# Patient Record
Sex: Female | Born: 1945 | Race: White | Hispanic: No | Marital: Married | Smoking: Former smoker
Health system: Southern US, Community
[De-identification: ages and names within clinical notes are randomized; demographics above are authoritative.]

## PROBLEM LIST (undated history)

## (undated) DIAGNOSIS — Z01818 Encounter for other preprocedural examination: Secondary | ICD-10-CM

## (undated) DIAGNOSIS — I10 Essential (primary) hypertension: Secondary | ICD-10-CM

## (undated) DIAGNOSIS — E7800 Pure hypercholesterolemia, unspecified: Secondary | ICD-10-CM

## (undated) DIAGNOSIS — M1990 Unspecified osteoarthritis, unspecified site: Secondary | ICD-10-CM

## (undated) HISTORY — PX: ABDOMINAL HYSTERECTOMY: SHX81

## (undated) HISTORY — PX: COSMETIC SURGERY: SHX468

## (undated) HISTORY — DX: Unspecified osteoarthritis, unspecified site: M19.90

## (undated) HISTORY — PX: FRACTURE SURGERY: SHX138

## (undated) HISTORY — DX: Encounter for other preprocedural examination: Z01.818

## (undated) HISTORY — PX: SMALL INTESTINE SURGERY: SHX150

## (undated) HISTORY — PX: TONSILLECTOMY: SUR1361

---

## 1978-08-13 HISTORY — PX: TUBAL LIGATION: SHX77

## 2015-01-16 ENCOUNTER — Emergency Department (HOSPITAL_BASED_OUTPATIENT_CLINIC_OR_DEPARTMENT_OTHER)
Admission: EM | Admit: 2015-01-16 | Discharge: 2015-01-16 | Disposition: A | Discharge status: Home / Self Care | Payer: Medicare Other | Attending: Emergency Medicine | Admitting: Emergency Medicine

## 2015-01-16 ENCOUNTER — Emergency Department (HOSPITAL_BASED_OUTPATIENT_CLINIC_OR_DEPARTMENT_OTHER): Payer: Medicare Other

## 2015-01-16 ENCOUNTER — Encounter (HOSPITAL_BASED_OUTPATIENT_CLINIC_OR_DEPARTMENT_OTHER): Payer: Self-pay | Admitting: Emergency Medicine

## 2015-01-16 DIAGNOSIS — Z7982 Long term (current) use of aspirin: Secondary | ICD-10-CM | POA: Diagnosis not present

## 2015-01-16 DIAGNOSIS — I10 Essential (primary) hypertension: Secondary | ICD-10-CM | POA: Insufficient documentation

## 2015-01-16 DIAGNOSIS — K8020 Calculus of gallbladder without cholecystitis without obstruction: Secondary | ICD-10-CM

## 2015-01-16 DIAGNOSIS — E780 Pure hypercholesterolemia: Secondary | ICD-10-CM | POA: Insufficient documentation

## 2015-01-16 DIAGNOSIS — R1011 Right upper quadrant pain: Secondary | ICD-10-CM | POA: Diagnosis present

## 2015-01-16 DIAGNOSIS — Z79899 Other long term (current) drug therapy: Secondary | ICD-10-CM | POA: Insufficient documentation

## 2015-01-16 HISTORY — DX: Pure hypercholesterolemia, unspecified: E78.00

## 2015-01-16 HISTORY — DX: Essential (primary) hypertension: I10

## 2015-01-16 LAB — CBC WITH DIFFERENTIAL/PLATELET
Basophils Absolute: 0 10*3/uL (ref 0.0–0.1)
Basophils Relative: 0 % (ref 0–1)
EOS PCT: 2 % (ref 0–5)
Eosinophils Absolute: 0.1 10*3/uL (ref 0.0–0.7)
HCT: 38.8 % (ref 36.0–46.0)
Hemoglobin: 13.2 g/dL (ref 12.0–15.0)
Lymphocytes Relative: 24 % (ref 12–46)
Lymphs Abs: 1.6 10*3/uL (ref 0.7–4.0)
MCH: 30.8 pg (ref 26.0–34.0)
MCHC: 34 g/dL (ref 30.0–36.0)
MCV: 90.4 fL (ref 78.0–100.0)
MONOS PCT: 9 % (ref 3–12)
Monocytes Absolute: 0.6 10*3/uL (ref 0.1–1.0)
Neutro Abs: 4.4 10*3/uL (ref 1.7–7.7)
Neutrophils Relative %: 65 % (ref 43–77)
PLATELETS: 268 10*3/uL (ref 150–400)
RBC: 4.29 MIL/uL (ref 3.87–5.11)
RDW: 12.2 % (ref 11.5–15.5)
WBC: 6.7 10*3/uL (ref 4.0–10.5)

## 2015-01-16 LAB — COMPREHENSIVE METABOLIC PANEL
ALK PHOS: 55 U/L (ref 38–126)
ALT: 20 U/L (ref 14–54)
ANION GAP: 8 (ref 5–15)
AST: 29 U/L (ref 15–41)
Albumin: 4.5 g/dL (ref 3.5–5.0)
BUN: 9 mg/dL (ref 6–20)
CALCIUM: 9.1 mg/dL (ref 8.9–10.3)
CO2: 28 mmol/L (ref 22–32)
Chloride: 100 mmol/L — ABNORMAL LOW (ref 101–111)
Creatinine, Ser: 0.71 mg/dL (ref 0.44–1.00)
GFR calc Af Amer: >60 mL/min (ref >60)
GLUCOSE: 99 mg/dL (ref 65–99)
Potassium: 3.7 mmol/L (ref 3.5–5.1)
SODIUM: 136 mmol/L (ref 135–145)
TOTAL PROTEIN: 7.2 g/dL (ref 6.5–8.1)
Total Bilirubin: 0.8 mg/dL (ref 0.3–1.2)

## 2015-01-16 LAB — URINALYSIS, ROUTINE W REFLEX MICROSCOPIC
Bilirubin Urine: NEGATIVE
Glucose, UA: NEGATIVE mg/dL
Hgb urine dipstick: NEGATIVE
Ketones, ur: NEGATIVE mg/dL
NITRITE: NEGATIVE
Protein, ur: NEGATIVE mg/dL
SPECIFIC GRAVITY, URINE: 1.004 — AB (ref 1.005–1.030)
Urobilinogen, UA: 0.2 mg/dL (ref 0.0–1.0)
pH: 6.5 (ref 5.0–8.0)

## 2015-01-16 LAB — URINE MICROSCOPIC-ADD ON

## 2015-01-16 LAB — LIPASE, BLOOD: Lipase: 20 U/L — ABNORMAL LOW (ref 22–51)

## 2015-01-16 MED ORDER — FENTANYL CITRATE (PF) 100 MCG/2ML IJ SOLN
50.0000 ug | Freq: Once | INTRAMUSCULAR | Status: AC
  Administered 2015-01-16: 50 ug via INTRAVENOUS
  Filled 2015-01-16: qty 2

## 2015-01-16 MED ORDER — OXYCODONE-ACETAMINOPHEN 5-325 MG PO TABS: 1.0000 | ORAL_TABLET | ORAL | Status: DC | PRN

## 2015-01-16 MED ORDER — ONDANSETRON HCL 4 MG/2ML IJ SOLN
4.0000 mg | Freq: Once | INTRAMUSCULAR | Status: AC
  Administered 2015-01-16: 4 mg via INTRAVENOUS
  Filled 2015-01-16: qty 2

## 2015-01-16 MED ORDER — ONDANSETRON 4 MG PO TBDP: ORAL_TABLET | ORAL | Status: DC

## 2015-01-16 MED ORDER — HYDROMORPHONE HCL 1 MG/ML IJ SOLN
0.5000 mg | Freq: Once | INTRAMUSCULAR | Status: AC
  Administered 2015-01-16: 0.5 mg via INTRAVENOUS
  Filled 2015-01-16: qty 1

## 2015-01-16 MED ORDER — SODIUM CHLORIDE 0.9 % IV BOLUS (SEPSIS)
1000.0000 mL | Freq: Once | INTRAVENOUS | Status: AC
  Administered 2015-01-16: 1000 mL via INTRAVENOUS

## 2015-01-16 MED ORDER — KETOROLAC TROMETHAMINE 30 MG/ML IJ SOLN
30.0000 mg | Freq: Once | INTRAMUSCULAR | Status: AC
  Administered 2015-01-16: 30 mg via INTRAVENOUS
  Filled 2015-01-16: qty 1

## 2015-01-16 NOTE — ED Notes (Signed)
Patient transported to Ultrasound 

## 2015-01-16 NOTE — Discharge Instructions (Signed)
Biliary Colic  °Biliary colic is a steady or irregular pain in the upper abdomen. It is usually under the right side of the rib cage. It happens when gallstones interfere with the normal flow of bile from the gallbladder. Bile is a liquid that helps to digest fats. Bile is made in the liver and stored in the gallbladder. When you eat a meal, bile passes from the gallbladder through the cystic duct and the common bile duct into the small intestine. There, it mixes with partially digested food. If a gallstone blocks either of these ducts, the normal flow of bile is blocked. The muscle cells in the bile duct contract forcefully to try to move the stone. This causes the pain of biliary colic.  °SYMPTOMS  °· A person with biliary colic usually complains of pain in the upper abdomen. This pain can be: °¨ In the center of the upper abdomen just below the breastbone. °¨ In the upper-right part of the abdomen, near the gallbladder and liver. °¨ Spread back toward the right shoulder blade. °· Nausea and vomiting. °· The pain usually occurs after eating. °· Biliary colic is usually triggered by the digestive system's demand for bile. The demand for bile is high after fatty meals. Symptoms can also occur when a person who has been fasting suddenly eats a very large meal. Most episodes of biliary colic pass after 1 to 5 hours. After the most intense pain passes, your abdomen may continue to ache mildly for about 24 hours. °DIAGNOSIS  °After you describe your symptoms, your caregiver will perform a physical exam. He or she will pay attention to the upper right portion of your belly (abdomen). This is the area of your liver and gallbladder. An ultrasound will help your caregiver look for gallstones. Specialized scans of the gallbladder may also be done. Blood tests may be done, especially if you have fever or if your pain persists. °PREVENTION  °Biliary colic can be prevented by controlling the risk factors for gallstones. Some of  these risk factors, such as heredity, increasing age, and pregnancy are a normal part of life. Obesity and a high-fat diet are risk factors you can change through a healthy lifestyle. Women going through menopause who take hormone replacement therapy (estrogen) are also more likely to develop biliary colic. °TREATMENT  °· Pain medication may be prescribed. °· You may be encouraged to eat a fat-free diet. °· If the first episode of biliary colic is severe, or episodes of colic keep retuning, surgery to remove the gallbladder (cholecystectomy) is usually recommended. This procedure can be done through small incisions using an instrument called a laparoscope. The procedure often requires a brief stay in the hospital. Some people can leave the hospital the same day. It is the most widely used treatment in people troubled by painful gallstones. It is effective and safe, with no complications in more than 90% of cases. °· If surgery cannot be done, medication that dissolves gallstones may be used. This medication is expensive and can take months or years to work. Only small stones will dissolve. °· Rarely, medication to dissolve gallstones is combined with a procedure called shock-wave lithotripsy. This procedure uses carefully aimed shock waves to break up gallstones. In many people treated with this procedure, gallstones form again within a few years. °PROGNOSIS  °If gallstones block your cystic duct or common bile duct, you are at risk for repeated episodes of biliary colic. There is also a 25% chance that you will develop   a gallbladder infection(acute cholecystitis), or some other complication of gallstones within 10 to 20 years. If you have surgery, schedule it at a time that is convenient for you and at a time when you are not sick. °HOME CARE INSTRUCTIONS  °· Drink plenty of clear fluids. °· Avoid fatty, greasy or fried foods, or any foods that make your pain worse. °· Take medications as directed. °SEEK MEDICAL  CARE IF:  °· You develop a fever over 100.5° F (38.1° C). °· Your pain gets worse over time. °· You develop nausea that prevents you from eating and drinking. °· You develop vomiting. °SEEK IMMEDIATE MEDICAL CARE IF:  °· You have continuous or severe belly (abdominal) pain which is not relieved with medications. °· You develop nausea and vomiting which is not relieved with medications. °· You have symptoms of biliary colic and you suddenly develop a fever and shaking chills. This may signal cholecystitis. Call your caregiver immediately. °· You develop a yellow color to your skin or the white part of your eyes (jaundice). °Document Released: 12/31/2005 Document Revised: 10/22/2011 Document Reviewed: 03/11/2008 °ExitCare® Patient Information ©2015 [employer]. This information is not intended to replace advice given to you by your health care provider. Make sure you discuss any questions you have with your health care provider. ° °

## 2015-01-16 NOTE — ED Notes (Signed)
Pt states she was just sitting and resting after a snack and felt a stabbing aching pain in her RUQ radiating around her right flank.

## 2015-01-16 NOTE — ED Notes (Signed)
Doctor at bedside.

## 2015-01-16 NOTE — ED Provider Notes (Signed)
CSN: [id number]     Arrival date & time 01/16/15  1525 History  This chart was scribed for Rolan Bucco, MD by Octavia Heir, ED Scribe. This patient was seen in room MH10/MH10 and the patient's care was started at 4:28 PM.    Chief Complaint  Patient presents with  . Abdominal Pain      The history is provided by the patient. No language interpreter was used.    HPI Comments: [name] is a 69 y.o. female who presents to the Emergency Department complaining of sudden onset, gradual worsening RUQ abdominal pain onset 4 hour hours ago. Pt notes she was sitting and felt a sudden stabbing sensation that radiates to her right flank. She denies nausea, vomiting, problems urinating, fevers, and past medical history of kidney stones. Pt notes she had a prior partial hysterectomy.  Past Medical History  Diagnosis Date  . Hypertension   . High cholesterol    Past Surgical History  Procedure Laterality Date  . Tonsillectomy    . Abdominal hysterectomy     No family history on file. History  Substance Use Topics  . Smoking status: Not on file  . Smokeless tobacco: Not on file  . Alcohol Use: Not on file   OB History    No data available     Review of Systems  Constitutional: Negative for fever, chills, diaphoresis and fatigue.  HENT: Negative for congestion, rhinorrhea and sneezing.   Eyes: Negative.   Respiratory: Negative for cough, chest tightness and shortness of breath.   Cardiovascular: Negative for chest pain and leg swelling.  Gastrointestinal: Positive for nausea and abdominal pain. Negative for vomiting, diarrhea and blood in stool.  Genitourinary: Negative for frequency, hematuria, flank pain and difficulty urinating.  Musculoskeletal: Positive for back pain. Negative for arthralgias.  Skin: Negative for rash.  Neurological: Negative for dizziness, speech difficulty, weakness, numbness and headaches.      Allergies  Penicillins and Sulfa antibiotics  Home  Medications   Prior to Admission medications   Medication Sig Start Date End Date Taking? Authorizing Provider  aspirin 81 MG tablet Take 81 mg by mouth daily.   Yes Historical Provider, MD  benazepril-hydrochlorthiazide (LOTENSIN HCT) 10-12.5 MG per tablet Take 1 tablet by mouth daily.   Yes Historical Provider, MD  ondansetron (ZOFRAN ODT) 4 MG disintegrating tablet  ODT q4 hours prn nausea/vomit 01/16/15   Rolan Bucco, MD  oxyCODONE-acetaminophen (PERCOCET) 5-325 MG per tablet Take 1-2 tablets by mouth every 4 (four) hours as needed. 01/16/15   Rolan Bucco, MD  pravastatin (PRAVACHOL) 10 MG tablet Take 10 mg by mouth daily.   Yes Historical Provider, MD   Triage vitals: BP 143/79 mmHg  Pulse 73  Temp(Src) 98 F (36.7 C) (Oral)  Resp 22  Ht  (1.575 m)  Wt 102 lb (46.267 kg)  BMI 18.65 kg/m2  SpO2 95% Physical Exam  Constitutional: She is oriented to person, place, and time. She appears well-developed and well-nourished.  HENT:  Head: Normocephalic and atraumatic.  Eyes: Pupils are equal, round, and reactive to light.  Neck: Normal range of motion. Neck supple.  Cardiovascular: Normal rate, regular rhythm and normal heart sounds.   Pulmonary/Chest: Effort normal and breath sounds normal. No respiratory distress. She has no wheezes. She has no rales. She exhibits no tenderness.  Abdominal: Soft. Bowel sounds are normal. There is tenderness (Tenderness to RUQ/right flank). There is no rebound and no guarding.  Musculoskeletal: Normal range of motion. She  exhibits no edema.  Lymphadenopathy:    She has no cervical adenopathy.  Neurological: She is alert and oriented to person, place, and time.  Skin: Skin is warm and dry. No rash noted.  Psychiatric: She has a normal mood and affect.    ED Course  Procedures  DIAGNOSTIC STUDIES: Oxygen Saturation is 95% on RA, adequate by my interpretation.  COORDINATION OF CARE: 4:29 PM Discussed treatment plan which includes pain  medication, blood work, and ultrasound with pt at bedside and pt agreed to plan.   Labs Review Labs Reviewed  COMPREHENSIVE METABOLIC PANEL - Abnormal; Notable for the following:    Chloride 100 (*)    All other components within normal limits  LIPASE, BLOOD - Abnormal; Notable for the following:    Lipase 20 (*)    All other components within normal limits  URINALYSIS, ROUTINE W REFLEX MICROSCOPIC (NOT AT [facility]) - Abnormal; Notable for the following:    Specific Gravity, Urine 1.004 (*)    Leukocytes, UA TRACE (*)    All other components within normal limits  CBC WITH DIFFERENTIAL/PLATELET  URINE MICROSCOPIC-ADD ON    Imaging Review [place] Abdomen Complete  01/16/2015   CLINICAL DATA:  RIGHT upper quadrant, RIGHT flank and epigastric pain since noon today after eating C renal, pain sharp, constant in severe, uncontrollable shaking, pain worse with deep inspiration, history hypertension  EXAM: ULTRASOUND ABDOMEN COMPLETE  COMPARISON:  None  FINDINGS: Gallbladder: Tiny mobile intraluminal foci likely tiny gallstones without shadowing, largest 4 mm diameter. No gallbladder wall thickening, pericholecystic fluid or sonographic Murphy sign.  Common bile duct: Diameter: Normal caliber 2 mm diameter  Liver: Normal parenchymal echogenicity. Small anterior cyst LEFT lobe 1.4 x 1.1 x 1.3 cm. Additional small cyst RIGHT lobe 10 x 9 x 7 mm. No intrahepatic biliary dilatation. Hepatopetal portal venous flow.  IVC: Normal appearance  Pancreas: Head, body and proximal tail normal appearance, distal tail obscured by bowel gas.  Spleen: Grossly normal morphology, 5.4 cm length  Right Kidney: Length: 9.9 cm. Normal cortical thickness and echogenicity. Probable cyst inferior pole 1.8 x 1.4 x 1.5 cm no hydronephrosis or shadowing calcification.  Left Kidney: Length: 10.1 cm. Normal cortical thickness and echogenicity. No mass or shadowing calcification. Mild fullness of LEFT renal pelvis though patient was unable to void  determine due to pain medications and significance this finding is uncertain.  Abdominal aorta: Proximally normal caliber, distally obscured by bowel gas.  Other findings: No free-fluid  IMPRESSION: Probable hepatic and RIGHT renal cysts.  Tiny gallstones without evidence of cholecystitis or biliary dilatation.  Fullness of LEFT renal collecting system, significance uncertain.   Electronically Signed   By: Ulyses SouthwardMark  Boles M.D.   On: 01/16/2015 17:42   Ct Renal Stone Study  01/16/2015   CLINICAL DATA:  Worsening right upper quadrant abdominal pain beginning four hours ago. Pain radiates into the right flank.  EXAM: CT ABDOMEN AND PELVIS WITHOUT CONTRAST  TECHNIQUE: Multidetector CT imaging of the abdomen and pelvis was performed following the standard protocol without IV contrast.  COMPARISON:  Abdominal ultrasound, same date.  FINDINGS: Lower chest: The lung bases are clear of acute process. No pleural effusion or pulmonary lesions. The heart is normal in size. No pericardial effusion. The distal esophagus and aorta are unremarkable. Mild tortuosity and ectasia of the descending thoracic aorta.  Hepatobiliary: No focal hepatic lesions or intrahepatic biliary dilatation. Small hepatic cysts are noted as demonstrated on the ultrasound examination. Colonic interposition is noted  with mild mass effect on the right hepatic lobe. The gallbladder is grossly normal. No common bile duct dilatation.  Pancreas: No mass or inflammation.  Spleen: Normal size.  No focal lesions.  Adrenals/Urinary Tract: The adrenal glands and kidneys are unremarkable. No renal or obstructing ureteral calculi or bladder calculi.  Stomach/Bowel: The stomach, duodenum, small bowel and colon are grossly normal without contrast. No inflammatory changes, mass lesions or obstructive findings. Moderate diverticulosis of the sigmoid colon. The appendix is not identified for certain but I do not see any findings to suggest appendicitis.  Vascular/Lymphatic:  No mesenteric or retroperitoneal mass or adenopathy. Small scattered lymph nodes are noted. Scattered aortic calcifications but no focal aneurysm.  Other: No pelvic mass or adenopathy. No free pelvic fluid collections. The bladder appears normal. No inguinal mass or adenopathy.  Musculoskeletal: No significant bony findings. Moderate thoracolumbar scoliosis. There is partial sacral is a shin of the L5 vertebral body noted.  IMPRESSION: 1. No acute abdominal/pelvic findings, mass lesions or adenopathy. 2. No renal, ureteral or bladder calculi. 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   Electronically Signed   By: Rudie Meyer M.D.   On: 01/16/2015 20:20     EKG Interpretation None      MDM   Final diagnoses:  Gallstones    Patient presents with right upper quadrant abdominal pain. She's rocking around on the bed. She is initially given fentanyl for pain control as patient was reluctant to take morphine. She had 2 doses of fentanyl and still is fairly uncomfortable. She had an ultrasound which showed small gallstones but no signs of cholecystitis. Her labs are unremarkable. Her pain is not well controlled after the 2 doses of fentanyl. I spoke with Dr. Abbey Chatters who did not feel that the patient definitely needs to be admitted to the hospital given her normal labs and non impressive ultrasound. He recommended trying a dose of Toradol which I did. She also was given 0.5 mg of Dilaudid. I did go ahead into a CT scan of the abdomen and pelvis to assess for a possible kidney stone. There is no evidence of kidney stones or other abdominal abnormalities other than colonic interposition. I consulted with Dr. Abbey Chatters regarding this finding. This potentially could be an etiology for her pain however there is no evidence of bowel obstruction or colonic wall thickening. He felt the patient could be discharged home with pain control and a clear liquid diet. I explained this to the patient. I  advised her that she needs to call in the morning to have close outpatient follow-up with surgery. I advised her to return to the emergency department at [facility] long or  should she have any worsening symptoms including worsening pain vomiting or fevers.  I personally performed the services described in this documentation, which was scribed in my presence.  The recorded information has been reviewed and considered.   Rolan Bucco, MD 01/17/15 0000

## 2015-01-16 NOTE — ED Notes (Signed)
MD at bedside. 

## 2015-01-18 ENCOUNTER — Ambulatory Visit: Payer: Self-pay | Admitting: General Surgery

## 2015-01-18 NOTE — Pre-Procedure Instructions (Addendum)
[name]  01/18/2015      [facility] - [place], [place] - [id number] [place] [id number] Phone: [phone] Fax: [phone]    Your procedure is scheduled on Fri, June 10 @ 7:30 AM  [facility] Admitting at 5:30 AM  Call this number if you have problems the morning of surgery:  [phone]   Remember:  Do not eat food or drink liquids after midnight.  Take these medicines the morning of surgery with A SIP OF WATER:Pain Pill(if needed)              Stop taking your Aspirin and Fish Oil. No Goody's,BC's,Aleve,Ibuprofen,or any Herbal Medications.    Do not wear jewelry, make-up or nail polish.  Do not wear lotions, powders, or perfumes.  You may wear deodorant.  Do not shave 48 hours prior to surgery.    Do not bring valuables to the hospital.  [facility] is not responsible for any belongings or valuables.  Contacts, dentures or bridgework may not be worn into surgery.  Leave your suitcase in the car.  After surgery it may be brought to your room.  For patients admitted to the hospital, discharge time will be determined by your treatment team.  Patients discharged the day of surgery will not be allowed to drive home.  [facility] [phone]   Special instructions:[facility] - Preparing for Surgery  Before surgery, you can play an important role.  Because skin is not sterile, your skin needs to be as free of germs as possible.  You can reduce the number of germs on you skin by washing with CHG (chlorahexidine gluconate) soap before surgery.  CHG is an antiseptic cleaner which kills germs and bonds with the skin to continue killing germs even after washing.  Please DO NOT use if you have an allergy to CHG or antibacterial soaps.  If your skin becomes reddened/irritated stop using the CHG and inform your nurse when you arrive at Short Stay.  Do not shave (including legs and underarms) for at least 48 hours  prior to the first CHG shower.  You may shave your face.  Please follow these instructions carefully:   1.  Shower with CHG Soap the night before surgery and the                                morning of Surgery.  2.  If you choose to wash your hair, wash your hair first as usual with your       normal shampoo.  3.  After you shampoo, rinse your hair and body thoroughly to remove the                      Shampoo.  4.  Use CHG as you would any other liquid soap.  You can apply chg directly       to the skin and wash gently with scrungie or a clean washcloth.  5.  Apply the CHG Soap to your body ONLY FROM THE NECK DOWN.        Do not use on open wounds or open sores.  Avoid contact with your eyes,       ears, mouth and genitals (private parts).  Wash genitals (private parts)       with your normal soap.  6.  Wash thoroughly, paying  special attention to the area where your surgery        will be performed.  7.  Thoroughly rinse your body with warm water from the neck down.  8.  DO NOT shower/wash with your normal soap after using and rinsing off       the CHG Soap.  9.  Pat yourself dry with a clean towel.            10.  Wear clean pajamas.            11.  Place clean sheets on your bed the night of your first shower and do not        sleep with pets.  Day of Surgery  Do not apply any lotions/deoderants the morning of surgery.  Please wear clean clothes to the hospital/surgery center.    Please read over the following fact sheets that you were given. Pain Booklet, Coughing and Deep Breathing and Surgical Site Infection Prevention

## 2015-01-19 ENCOUNTER — Encounter (HOSPITAL_COMMUNITY): Payer: Self-pay

## 2015-01-19 ENCOUNTER — Encounter (HOSPITAL_COMMUNITY)
Admission: RE | Admit: 2015-01-19 | Discharge: 2015-01-19 | Disposition: A | Discharge status: Home / Self Care | Payer: Medicare Other | Source: Ambulatory Visit | Attending: General Surgery | Admitting: General Surgery

## 2015-01-19 DIAGNOSIS — I10 Essential (primary) hypertension: Secondary | ICD-10-CM | POA: Diagnosis not present

## 2015-01-19 DIAGNOSIS — M1990 Unspecified osteoarthritis, unspecified site: Secondary | ICD-10-CM | POA: Diagnosis not present

## 2015-01-19 DIAGNOSIS — E780 Pure hypercholesterolemia: Secondary | ICD-10-CM | POA: Diagnosis not present

## 2015-01-19 DIAGNOSIS — Z01818 Encounter for other preprocedural examination: Secondary | ICD-10-CM

## 2015-01-19 DIAGNOSIS — Z87891 Personal history of nicotine dependence: Secondary | ICD-10-CM | POA: Diagnosis not present

## 2015-01-19 DIAGNOSIS — K8020 Calculus of gallbladder without cholecystitis without obstruction: Secondary | ICD-10-CM | POA: Diagnosis not present

## 2015-01-19 LAB — CBC WITH DIFFERENTIAL/PLATELET
BASOS PCT: 0 % (ref 0–1)
Basophils Absolute: 0 10*3/uL (ref 0.0–0.1)
EOS ABS: 0.2 10*3/uL (ref 0.0–0.7)
Eosinophils Relative: 4 % (ref 0–5)
HCT: 38.9 % (ref 36.0–46.0)
HEMOGLOBIN: 13.4 g/dL (ref 12.0–15.0)
LYMPHS PCT: 29 % (ref 12–46)
Lymphs Abs: 1.7 10*3/uL (ref 0.7–4.0)
MCH: 31.2 pg (ref 26.0–34.0)
MCHC: 34.4 g/dL (ref 30.0–36.0)
MCV: 90.7 fL (ref 78.0–100.0)
MONOS PCT: 7 % (ref 3–12)
Monocytes Absolute: 0.4 10*3/uL (ref 0.1–1.0)
NEUTROS ABS: 3.4 10*3/uL (ref 1.7–7.7)
Neutrophils Relative %: 60 % (ref 43–77)
Platelets: 254 10*3/uL (ref 150–400)
RBC: 4.29 MIL/uL (ref 3.87–5.11)
RDW: 12.4 % (ref 11.5–15.5)
WBC: 5.7 10*3/uL (ref 4.0–10.5)

## 2015-01-19 LAB — COMPREHENSIVE METABOLIC PANEL
ALBUMIN: 4.2 g/dL (ref 3.5–5.0)
ALK PHOS: 55 U/L (ref 38–126)
ALT: 20 U/L (ref 14–54)
AST: 27 U/L (ref 15–41)
Anion gap: 9 (ref 5–15)
CHLORIDE: 100 mmol/L — AB (ref 101–111)
CO2: 29 mmol/L (ref 22–32)
Calcium: 9.2 mg/dL (ref 8.9–10.3)
Creatinine, Ser: 0.76 mg/dL (ref 0.44–1.00)
GFR calc non Af Amer: >60 mL/min (ref >60)
Glucose, Bld: 106 mg/dL — ABNORMAL HIGH (ref 65–99)
POTASSIUM: 3.8 mmol/L (ref 3.5–5.1)
Sodium: 138 mmol/L (ref 135–145)
Total Bilirubin: 0.6 mg/dL (ref 0.3–1.2)
Total Protein: 6.8 g/dL (ref 6.5–8.1)

## 2015-01-19 MED ORDER — CHLORHEXIDINE GLUCONATE 4 % EX LIQD: 1.0000 "application " | Freq: Once | CUTANEOUS | Status: DC

## 2015-01-19 NOTE — Progress Notes (Signed)
Anesthesia Chart Review:  Pt is 69 year old female scheduled for laparoscopic cholecystectomy with possible intraoperative cholangiogram on 01/21/2015 with Dr. Lindie SpruceWyatt.   PMH includes: HTN, hyperlipidemia. Former smoker. BMI 19.  Medications include: ASA, benazepril-hctz, pravastatin.   Preoperative labs reviewed.    Chest x-ray 01/19/2015 shows emphysema.   EKG 01/19/2015: Sinus bradycardia (57 bpm) with premature supraventricular complexes. Septal infarct, age undetermined  Spoke with pt by telephone. Does not remember ever having an EKG in the past. Denies ever having chest pain or SOB. Reports does not exercise but does clean her house, her son's house and her cousins house.   Reviewed case with Dr. Renold DonGermeroth.   If no changes, I anticipate pt can proceed with surgery as scheduled.   Rica Mastngela Rashad Auld, FNP-BC [facility]Anesthesiology Phone: [phone] 01/19/2015 3:54 PM

## 2015-01-21 ENCOUNTER — Ambulatory Visit (HOSPITAL_COMMUNITY): Payer: Medicare Other | Admitting: Anesthesiology

## 2015-01-21 ENCOUNTER — Ambulatory Visit (HOSPITAL_COMMUNITY)
Admission: RE | Admit: 2015-01-21 | Discharge: 2015-01-21 | Disposition: A | Discharge status: Home / Self Care | Payer: Medicare Other | Source: Ambulatory Visit | Attending: General Surgery | Admitting: General Surgery

## 2015-01-21 ENCOUNTER — Ambulatory Visit (HOSPITAL_COMMUNITY): Payer: Medicare Other | Admitting: Emergency Medicine

## 2015-01-21 ENCOUNTER — Encounter (HOSPITAL_COMMUNITY)
Admission: RE | Disposition: A | Discharge status: Home / Self Care | Payer: Self-pay | Source: Ambulatory Visit | Attending: General Surgery

## 2015-01-21 ENCOUNTER — Encounter (HOSPITAL_COMMUNITY): Payer: Self-pay | Admitting: *Deleted

## 2015-01-21 DIAGNOSIS — E780 Pure hypercholesterolemia: Secondary | ICD-10-CM | POA: Insufficient documentation

## 2015-01-21 DIAGNOSIS — K8020 Calculus of gallbladder without cholecystitis without obstruction: Secondary | ICD-10-CM | POA: Diagnosis not present

## 2015-01-21 DIAGNOSIS — M1990 Unspecified osteoarthritis, unspecified site: Secondary | ICD-10-CM | POA: Diagnosis not present

## 2015-01-21 DIAGNOSIS — I10 Essential (primary) hypertension: Secondary | ICD-10-CM | POA: Diagnosis not present

## 2015-01-21 DIAGNOSIS — Z87891 Personal history of nicotine dependence: Secondary | ICD-10-CM | POA: Insufficient documentation

## 2015-01-21 HISTORY — PX: CHOLECYSTECTOMY: SHX55

## 2015-01-21 SURGERY — LAPAROSCOPIC CHOLECYSTECTOMY WITH INTRAOPERATIVE CHOLANGIOGRAM
Anesthesia: General | Site: Abdomen

## 2015-01-21 MED ORDER — FENTANYL CITRATE (PF) 100 MCG/2ML IJ SOLN
INTRAMUSCULAR | Status: DC | PRN
  Administered 2015-01-21: 50 ug via INTRAVENOUS
  Administered 2015-01-21: 100 ug via INTRAVENOUS
  Administered 2015-01-21: 50 ug via INTRAVENOUS

## 2015-01-21 MED ORDER — 0.9 % SODIUM CHLORIDE (POUR BTL) OPTIME
TOPICAL | Status: DC | PRN
  Administered 2015-01-21: 1000 mL

## 2015-01-21 MED ORDER — TRAMADOL HCL 50 MG PO TABS: 50.0000 mg | ORAL_TABLET | Freq: Four times a day (QID) | ORAL | Status: DC | PRN

## 2015-01-21 MED ORDER — TRAMADOL HCL 50 MG PO TABS
ORAL_TABLET | ORAL | Status: AC
  Filled 2015-01-21: qty 2

## 2015-01-21 MED ORDER — LIDOCAINE HCL (CARDIAC) 20 MG/ML IV SOLN
INTRAVENOUS | Status: DC | PRN
Start: 2015-01-21 — End: 2015-01-21
  Administered 2015-01-21: 80 mg via INTRAVENOUS

## 2015-01-21 MED ORDER — CEFOXITIN SODIUM 2 G IV SOLR
2.0000 g | INTRAVENOUS | Status: AC
  Administered 2015-01-21: 2 g via INTRAVENOUS
  Filled 2015-01-21: qty 2

## 2015-01-21 MED ORDER — BUPIVACAINE-EPINEPHRINE 0.25% -1:200000 IJ SOLN
INTRAMUSCULAR | Status: DC | PRN
  Administered 2015-01-21: 12 mL

## 2015-01-21 MED ORDER — DEXAMETHASONE SODIUM PHOSPHATE 4 MG/ML IJ SOLN
INTRAMUSCULAR | Status: AC
  Filled 2015-01-21: qty 1

## 2015-01-21 MED ORDER — ARTIFICIAL TEARS OP OINT
TOPICAL_OINTMENT | OPHTHALMIC | Status: DC | PRN
  Administered 2015-01-21: 1 via OPHTHALMIC

## 2015-01-21 MED ORDER — SCOPOLAMINE 1 MG/3DAYS TD PT72
MEDICATED_PATCH | TRANSDERMAL | Status: AC
  Filled 2015-01-21: qty 1

## 2015-01-21 MED ORDER — HYDROMORPHONE HCL 1 MG/ML IJ SOLN
INTRAMUSCULAR | Status: AC
  Filled 2015-01-21: qty 1

## 2015-01-21 MED ORDER — PROPOFOL 10 MG/ML IV BOLUS
INTRAVENOUS | Status: AC
  Filled 2015-01-21: qty 20

## 2015-01-21 MED ORDER — HYDROMORPHONE HCL 1 MG/ML IJ SOLN
0.2500 mg | INTRAMUSCULAR | Status: DC | PRN
  Administered 2015-01-21 (×2): 0.25 mg via INTRAVENOUS
  Administered 2015-01-21: 0.5 mg via INTRAVENOUS

## 2015-01-21 MED ORDER — MIDAZOLAM HCL 5 MG/5ML IJ SOLN
INTRAMUSCULAR | Status: DC | PRN
  Administered 2015-01-21: 1 mg via INTRAVENOUS

## 2015-01-21 MED ORDER — MIDAZOLAM HCL 2 MG/2ML IJ SOLN
INTRAMUSCULAR | Status: AC
  Filled 2015-01-21: qty 2

## 2015-01-21 MED ORDER — FENTANYL CITRATE (PF) 250 MCG/5ML IJ SOLN
INTRAMUSCULAR | Status: AC
  Filled 2015-01-21: qty 5

## 2015-01-21 MED ORDER — BUPIVACAINE-EPINEPHRINE (PF) 0.25% -1:200000 IJ SOLN
INTRAMUSCULAR | Status: AC
  Filled 2015-01-21: qty 30

## 2015-01-21 MED ORDER — SUCCINYLCHOLINE CHLORIDE 20 MG/ML IJ SOLN
INTRAMUSCULAR | Status: DC | PRN
  Administered 2015-01-21: 120 mg via INTRAVENOUS

## 2015-01-21 MED ORDER — TRAMADOL HCL 50 MG PO TABS
100.0000 mg | ORAL_TABLET | Freq: Once | ORAL | Status: AC
  Administered 2015-01-21: 100 mg via ORAL

## 2015-01-21 MED ORDER — SUCCINYLCHOLINE CHLORIDE 20 MG/ML IJ SOLN
INTRAMUSCULAR | Status: AC
  Filled 2015-01-21: qty 1

## 2015-01-21 MED ORDER — PHENYLEPHRINE HCL 10 MG/ML IJ SOLN
INTRAMUSCULAR | Status: DC | PRN
  Administered 2015-01-21: 40 ug via INTRAVENOUS

## 2015-01-21 MED ORDER — ONDANSETRON HCL 4 MG/2ML IJ SOLN
INTRAMUSCULAR | Status: DC | PRN
  Administered 2015-01-21: 4 mg via INTRAVENOUS

## 2015-01-21 MED ORDER — SCOPOLAMINE 1 MG/3DAYS TD PT72
1.0000 | MEDICATED_PATCH | TRANSDERMAL | Status: DC
  Administered 2015-01-21: 1.5 mg via TRANSDERMAL

## 2015-01-21 MED ORDER — SODIUM CHLORIDE 0.9 % IR SOLN
Status: DC | PRN
  Administered 2015-01-21: 1000 mL

## 2015-01-21 MED ORDER — PROMETHAZINE HCL 25 MG/ML IJ SOLN: 6.2500 mg | INTRAMUSCULAR | Status: DC | PRN

## 2015-01-21 MED ORDER — EPHEDRINE SULFATE 50 MG/ML IJ SOLN
INTRAMUSCULAR | Status: DC | PRN
  Administered 2015-01-21: 10 mg via INTRAVENOUS
  Administered 2015-01-21: 5 mg via INTRAVENOUS

## 2015-01-21 MED ORDER — DEXAMETHASONE SODIUM PHOSPHATE 4 MG/ML IJ SOLN
INTRAMUSCULAR | Status: DC | PRN
  Administered 2015-01-21: 4 mg via INTRAVENOUS

## 2015-01-21 MED ORDER — PROPOFOL 10 MG/ML IV BOLUS
INTRAVENOUS | Status: DC | PRN
  Administered 2015-01-21: 130 mg via INTRAVENOUS

## 2015-01-21 MED ORDER — LACTATED RINGERS IV SOLN
INTRAVENOUS | Status: DC | PRN
  Administered 2015-01-21 (×2): via INTRAVENOUS

## 2015-01-21 SURGICAL SUPPLY — 46 items
APPLIER CLIP 5 13 M/L LIGAMAX5 (MISCELLANEOUS) ×3
BLADE SURG ROTATE 9660 (MISCELLANEOUS) IMPLANT
CANISTER SUCTION 2500CC (MISCELLANEOUS) ×3 IMPLANT
CHLORAPREP W/TINT 26ML (MISCELLANEOUS) ×3 IMPLANT
CLIP APPLIE 5 13 M/L LIGAMAX5 (MISCELLANEOUS) ×1 IMPLANT
CLOSURE WOUND 1/2 X4 (GAUZE/BANDAGES/DRESSINGS) ×1
COVER MAYO STAND STRL (DRAPES) IMPLANT
COVER SURGICAL LIGHT HANDLE (MISCELLANEOUS) ×3 IMPLANT
DRAPE C-ARM 42X72 X-RAY (DRAPES) IMPLANT
DRAPE LAPAROSCOPIC ABDOMINAL (DRAPES) ×3 IMPLANT
DRSG TEGADERM 2-3/8X2-3/4 SM (GAUZE/BANDAGES/DRESSINGS) ×12 IMPLANT
ELECT REM PT RETURN 9FT ADLT (ELECTROSURGICAL) ×3
ELECTRODE REM PT RTRN 9FT ADLT (ELECTROSURGICAL) ×1 IMPLANT
GLOVE BIO SURGEON STRL SZ7 (GLOVE) ×3 IMPLANT
GLOVE BIO SURGEON STRL SZ7.5 (GLOVE) ×3 IMPLANT
GLOVE BIOGEL PI IND STRL 6.5 (GLOVE) ×1 IMPLANT
GLOVE BIOGEL PI IND STRL 7.0 (GLOVE) ×1 IMPLANT
GLOVE BIOGEL PI IND STRL 7.5 (GLOVE) ×1 IMPLANT
GLOVE BIOGEL PI IND STRL 8 (GLOVE) ×1 IMPLANT
GLOVE BIOGEL PI INDICATOR 6.5 (GLOVE) ×2
GLOVE BIOGEL PI INDICATOR 7.0 (GLOVE) ×2
GLOVE BIOGEL PI INDICATOR 7.5 (GLOVE) ×2
GLOVE BIOGEL PI INDICATOR 8 (GLOVE) ×2
GLOVE ECLIPSE 7.5 STRL STRAW (GLOVE) ×3 IMPLANT
GLOVE SURG SS PI 7.0 STRL IVOR (GLOVE) ×3 IMPLANT
GOWN STRL REUS W/ TWL LRG LVL3 (GOWN DISPOSABLE) ×3 IMPLANT
GOWN STRL REUS W/TWL LRG LVL3 (GOWN DISPOSABLE) ×6
KIT BASIN OR (CUSTOM PROCEDURE TRAY) ×3 IMPLANT
KIT ROOM TURNOVER OR (KITS) ×3 IMPLANT
LIQUID BAND (GAUZE/BANDAGES/DRESSINGS) ×3 IMPLANT
NS IRRIG 1000ML POUR BTL (IV SOLUTION) ×3 IMPLANT
PAD ARMBOARD 7.5X6 YLW CONV (MISCELLANEOUS) ×3 IMPLANT
POUCH SPECIMEN RETRIEVAL 10MM (ENDOMECHANICALS) ×3 IMPLANT
SCISSORS LAP 5X35 DISP (ENDOMECHANICALS) ×3 IMPLANT
SET CHOLANGIOGRAPH 5 50 .035 (SET/KITS/TRAYS/PACK) IMPLANT
SET IRRIG TUBING LAPAROSCOPIC (IRRIGATION / IRRIGATOR) ×3 IMPLANT
SLEEVE ENDOPATH XCEL 5M (ENDOMECHANICALS) ×6 IMPLANT
SPECIMEN JAR SMALL (MISCELLANEOUS) ×3 IMPLANT
STRIP CLOSURE SKIN 1/2X4 (GAUZE/BANDAGES/DRESSINGS) ×2 IMPLANT
SUT MNCRL AB 4-0 PS2 18 (SUTURE) ×3 IMPLANT
TOWEL OR 17X24 6PK STRL BLUE (TOWEL DISPOSABLE) ×3 IMPLANT
TOWEL OR 17X26 10 PK STRL BLUE (TOWEL DISPOSABLE) ×3 IMPLANT
TRAY LAPAROSCOPIC (CUSTOM PROCEDURE TRAY) ×3 IMPLANT
TROCAR XCEL BLUNT TIP 100MML (ENDOMECHANICALS) ×3 IMPLANT
TROCAR XCEL NON-BLD 5MMX100MML (ENDOMECHANICALS) ×3 IMPLANT
TUBING INSUFFLATION (TUBING) ×3 IMPLANT

## 2015-01-21 NOTE — Interval H&P Note (Signed)
History and Physical Interval Note: No changes since bing seen in the office. 01/21/2015 7:11 AM  [name]  has presented today for surgery, with the diagnosis of symptomatic cholelithis  The various methods of treatment have been discussed with the patient and family. After consideration of risks, benefits and other options for treatment, the patient has consented to  Procedure(s): LAPAROSCOPIC CHOLECYSTECTOMY WITH POSSIBLE INTRAOPERATIVE CHOLANGIOGRAM (N/A) as a surgical intervention .  The patient's history has been reviewed, patient examined, no change in status, stable for surgery.  I have reviewed the patient's chart and labs.  Questions were answered to the patient's satisfaction.     Hazley Dezeeuw

## 2015-01-21 NOTE — H&P (Signed)
[name] 01/18/2015 8:33 AM Location: [facility] Patient #: [id number] DOB: 23-Apr-1946 Married / Language: English / Race: White Female  History of Present Illness [name]. Lindie Spruce MD; 01/18/2015 8:54 AM) Patient words: GB.  The patient is a 69 year old female who presents with non-malignant abdominal pain. The diagnosis was made 2 days ago. Past treatment has included opioid analgesics. The pain was diminished by a lot. Pain scores include a current pain level of 0 /10. The patient describes the pain as sharp, deep and crampy.   Other Problems (Ammie Eversole, LPN; 04/18/7590 6:38 AM) Arthritis Cholelithiasis High blood pressure Hypercholesterolemia  Past Surgical History (Ammie Eversole, LPN; 11/16/6597 3:57 AM) Foot Surgery Right. Hysterectomy (not due to cancer) - Partial Tonsillectomy  Diagnostic Studies History (Ammie Eversole, LPN; 0/08/7791 9:03 AM) Colonoscopy never Pap Smear >5 years ago  Allergies (Ammie Eversole, LPN; 0/0/9233 0:07 AM) Aspirin *ANALGESICS - NonNarcotic* Morphine Sulfate (Concentrate) *ANALGESICS - OPIOID* Avelox *FLUOROQUINOLONES* Penicillins Sulfa Antibiotics  Medication History (Ammie Eversole, LPN; 01/13/2632 3:54 AM) Aspirin EC Low Strength (81MG  Tablet DR, Oral) Active. Lotensin HCT (10-12.5MG  Tablet, Oral) Active. Zofran ODT (4MG  Tablet Disperse, Oral) Active. Percocet (5-325MG  Tablet, Oral) Active. Fish Oil Active. Bioflex (Oral) Active. Pravachol (10MG  Tablet, Oral) Active.  Social History (Ammie Eversole, LPN; 12/16/2561 8:93 AM) Caffeine use Coffee. No alcohol use No drug use Tobacco use Former smoker.  Family History (Ammie Eversole, LPN; 02/12/4286 6:81 AM) Alcohol Abuse Father. Hypertension Father, Mother, Sister. Respiratory Condition Mother.  Pregnancy / Birth History [name], LPN; 08/17/7260 0:35 AM) Age at menarche 18 years. Gravida 4 Maternal age 62-25 Para 3  Review  of Systems (Ammie Eversole LPN; 12/20/7414 3:84 AM) General Not Present- Appetite Loss, Chills, Fatigue, Fever, Night Sweats, Weight Gain and Weight Loss. Skin Not Present- Change in Wart/Mole, Dryness, Hives, Jaundice, New Lesions, Non-Healing Wounds, Rash and Ulcer. HEENT Not Present- Earache, Hearing Loss, Hoarseness, Nose Bleed, Oral Ulcers, Ringing in the Ears, Seasonal Allergies, Sinus Pain, Sore Throat, Visual Disturbances, Wears glasses/contact lenses and Yellow Eyes. Breast Not Present- Breast Mass, Breast Pain, Nipple Discharge and Skin Changes. Cardiovascular Not Present- Chest Pain, Difficulty Breathing Lying Down, Leg Cramps, Palpitations, Rapid Heart Rate, Shortness of Breath and Swelling of Extremities. Gastrointestinal Not Present- Abdominal Pain, Bloating, Bloody Stool, Change in Bowel Habits, Chronic diarrhea, Constipation, Difficulty Swallowing, Excessive gas, Gets full quickly at meals, Hemorrhoids, Indigestion, Nausea, Rectal Pain and Vomiting. Musculoskeletal Not Present- Back Pain, Joint Pain, Joint Stiffness, Muscle Pain, Muscle Weakness and Swelling of Extremities. Neurological Not Present- Decreased Memory, Fainting, Headaches, Numbness, Seizures, Tingling, Tremor, Trouble walking and Weakness. Psychiatric Not Present- Anxiety, Bipolar, Change in Sleep Pattern, Depression, Fearful and Frequent crying. Endocrine Not Present- Cold Intolerance, Excessive Hunger, Hair Changes, Heat Intolerance, Hot flashes and New Diabetes. Hematology Not Present- Easy Bruising, Excessive bleeding, Gland problems, HIV and Persistent Infections.   Vitals (Ammie Eversole LPN; 12/14/6466 0:32 AM) 01/18/2015 8:33 AM Weight: 103 lb Height: 62in Body Surface Area: 1.43 m Body Mass Index: 18.84 kg/m Temp.: 97.54F(Oral)  Pulse: 88 (Regular)  BP: 170/82 (Sitting, Left Arm, Standard)    Physical Exam (Jester Klingberg O. Naiah Donahoe MD; 01/18/2015 9:00 AM) General Mental Status-Alert. General  Appearance-Well groomed and Consistent with stated age. Orientation-Oriented X4. Build & Nutrition -Note: Skinny with belly.  Posture-Normal posture.  Head and Neck Neck Global Assessment - Note: No bruits bilaterally.  Chest and Lung Exam Chest and lung exam reveals -normal excursion with symmetric chest walls, quiet, even and easy  respiratory effort with no use of accessory muscles and on auscultation, normal breath sounds, no adventitious sounds and normal vocal resonance.  Abdomen Palpation/Percussion Tenderness - Epigastrium(Mild) and Right Upper Quadrant(Mild). Auscultation Auscultation of the abdomen reveals - Bowel sounds normal.    Assessment & Plan Fayrene Fearing O. Maie Kesinger MD; 01/18/2015 9:04 AM) SYMPTOMATIC CHOLELITHIASIS (574.20  K80.20) Impression: Patient seen in the ED two days ago, but probably has been having symptoms for a while. Better now, but she thought she was to have surgery today. Will get her scheduled ASAP. Her right colon tracks above the liver on the right side and will need to be avoided during surgery.

## 2015-01-21 NOTE — Discharge Instructions (Signed)

## 2015-01-21 NOTE — Transfer of Care (Signed)
Immediate Anesthesia Transfer of Care Note  Patient: [name]  Procedure(s) Performed: Procedure(s): LAPAROSCOPIC CHOLECYSTECTOMY (N/A)  Patient Location: PACU  Anesthesia Type:General  Level of Consciousness: awake, alert , oriented and patient cooperative  Airway & Oxygen Therapy: Patient Spontanous Breathing and Patient connected to nasal cannula oxygen  Post-op Assessment: Report given to RN, Post -op Vital signs reviewed and stable and Patient moving all extremities  Post vital signs: Reviewed and stable  Last Vitals:  Filed Vitals:   01/21/15 0649  BP: 169/91  Pulse: 64  Temp: 36.8 C  Resp: 16    Complications: No apparent anesthesia complications

## 2015-01-21 NOTE — Anesthesia Procedure Notes (Signed)
Procedure Name: Intubation Date/Time: 01/21/2015 7:24 AM Performed by: Roney Mans P Pre-anesthesia Checklist: Patient identified, Timeout performed, Emergency Drugs available, Suction available and Patient being monitored Patient Re-evaluated:Patient Re-evaluated prior to inductionOxygen Delivery Method: Circle system utilized Preoxygenation: Pre-oxygenation with 100% oxygen Intubation Type: IV induction Ventilation: Mask ventilation without difficulty Laryngoscope Size: Mac and 3 Grade View: Grade I Tube type: Oral Tube size: 7.0 mm Number of attempts: 1 Airway Equipment and Method: Stylet Placement Confirmation: ETT inserted through vocal cords under direct vision,  breath sounds checked- equal and bilateral and positive ETCO2 Secured at: 21 cm Tube secured with: Tape Dental Injury: Teeth and Oropharynx as per pre-operative assessment

## 2015-01-21 NOTE — Progress Notes (Signed)
Pt. Ambulated to bathroom and voided moderate amt clear urine.  States pressure in abd. Improved.  Abd dressings remains dry and intact.  Instructed patient and family to return to ER if pt has any further difficulty voiding.

## 2015-01-21 NOTE — Anesthesia Preprocedure Evaluation (Signed)
Anesthesia Evaluation  Patient identified by MRN, date of birth, ID band Patient awake    Reviewed: Allergy & Precautions, NPO status , Patient's Chart, lab work & pertinent test results  Airway Mallampati: I  TM Distance: >3 FB Neck ROM: Full    Dental  (+) Edentulous Upper, Partial Lower, Dental Advisory Given   Pulmonary former smoker,    Pulmonary exam normal       Cardiovascular hypertension, Pt. on medications negative cardio ROS Normal cardiovascular exam    Neuro/Psych negative neurological ROS  negative psych ROS   GI/Hepatic negative GI ROS, Neg liver ROS,   Endo/Other  negative endocrine ROS  Renal/GU negative Renal ROS  negative genitourinary   Musculoskeletal negative musculoskeletal ROS (+)   Abdominal   Peds negative pediatric ROS (+)  Hematology negative hematology ROS (+)   Anesthesia Other Findings   Reproductive/Obstetrics negative OB ROS                             Anesthesia Physical Anesthesia Plan  ASA: II  Anesthesia Plan: General   Post-op Pain Management:    Induction: Intravenous  Airway Management Planned: Oral ETT  Additional Equipment:   Intra-op Plan:   Post-operative Plan: Extubation in OR  Informed Consent: I have reviewed the patients History and Physical, chart, labs and discussed the procedure including the risks, benefits and alternatives for the proposed anesthesia with the patient or authorized representative who has indicated his/her understanding and acceptance.   Dental advisory given  Plan Discussed with: CRNA, Anesthesiologist and Surgeon  Anesthesia Plan Comments:         Anesthesia Quick Evaluation

## 2015-01-21 NOTE — Progress Notes (Signed)
Documented by Richardo Priest RN  Under my sign in. Report given to [facility] at [phone]

## 2015-01-21 NOTE — Anesthesia Postprocedure Evaluation (Signed)
Anesthesia Post Note  Patient: [name]  Procedure(s) Performed: Procedure(s) (LRB): LAPAROSCOPIC CHOLECYSTECTOMY (N/A)  Anesthesia type: general  Patient location: PACU  Post pain: Pain level controlled  Post assessment: Patient's Cardiovascular Status Stable  Last Vitals:  Filed Vitals:   01/21/15 0952  BP:   Pulse:   Temp: 36.4 C  Resp: 17    Post vital signs: Reviewed and stable  Level of consciousness: sedated  Complications: No apparent anesthesia complications

## 2015-01-21 NOTE — Op Note (Signed)
OPERATIVE REPORT  DATE OF OPERATION: 01/21/2015  PATIENT:  [name]  69 y.o. female  PRE-OPERATIVE DIAGNOSIS:  symptomatic cholelithiasis  POST-OPERATIVE DIAGNOSIS:  symptomatic cholelithiasis  PROCEDURE:  Procedure(s): LAPAROSCOPIC CHOLECYSTECTOMY  SURGEON:  Surgeon(s): Jimmye Norman, MD  ASSISTANT: None  ANESTHESIA:   general  EBL: <20 ml  BLOOD ADMINISTERED: none  DRAINS: none   SPECIMEN:  Source of Specimen:  Gallbladder and contents  COUNTS CORRECT:  YES  PROCEDURE DETAILS: The patient was taken to the operating room and placed on the table in the supine position.  After an adequate endotracheal anesthetic was administered, the patient was prepped with ChloroPrep, and then draped in the usual manner exposing the entire abdomen laterally, inferiorly and up  to the costal margins.  After a proper timeout was performed including identifying the patient and the procedure to be performed, a infraumbilical 1.5cm midline incision was made using a #15 blade.  This was taken down to the fascia which was then incised with a #15 blade.  The edges of the fascia were tented up with Kocher clamps as the preperitoneal space was penetrated with a Kelly clamp into the peritoneum.  Once this was done, a pursestring suture of 0 Vicryl was passed around the fascial opening.  This was subsequently used to secure the [facility] cannula which was passed into the peritoneal cavity.  The patient has a very short torso, and the subcostal trocars enter the abdomen below the right anterior superior iliac spine  Once the [facility] cannula was in place, carbon dioxide gas was insufflated into the peritoneal cavity up to a maximal intra-abdominal pressure of 33mm Hg.The laparoscope, with attached camera and light source, was passed into the peritoneal cavity to visualize the direct insertion of two right upper quadrant 33mm cannulas, and a sup-xiphoid 30mm cannula.  Once all cannulas were in place, the  dissection was begun.  Two ratcheted graspers were attached to the dome and infundibulum of the gallbladder and retracted towards the anterior abdominal wall and the right upper quadrant.  Using cautery attached to a dissecting forceps, the peritoneum overlaying the triangle of Chalot and the hepatoduodenal triangle was dissected away exposing the cystic duct and the cystic artery.  The cystic artery was clipped proximally and distally then transected.  A clip was placed on the gallbladder side of the cystic duct, then the distal cystic duct was clipped multiple times then transected between the clips.  The gallbladder was then dissected out of the hepatic bed without event.  It was retrieved from the abdomen (using an EndoCatch bag) without event.  Once the gallbladder was removed, the bed was inspected for hemostasis.  Once excellent hemostasis was obtained all gas and fluids were aspirated from above the liver, then the cannulas were removed.  The infraumbilical incision was closed using the pursestring suture which was in place.  0.25% bupivicaine with epinephrine was injected at all sites.  All 61mm or greater cannula sites were close using a running subcuticular stitch of 4-0 Monocryl.  5.53mm cannula sites were closed with Dermabond only.Steri-Strips and Tagaderm were used to complete the dressings at all sites.  At this point all needle, sponge, and instrument counts were correct.The patient was awakened from anesthesia and taken to the PACU in stable condition.   PATIENT DISPOSITION:  PACU - hemodynamically stable.   Javione Gunawan 6/10/20168:18 AM

## 2015-01-24 ENCOUNTER — Encounter (HOSPITAL_COMMUNITY): Payer: Self-pay | Admitting: General Surgery

## 2015-03-31 DIAGNOSIS — I10 Essential (primary) hypertension: Secondary | ICD-10-CM | POA: Insufficient documentation

## 2015-03-31 DIAGNOSIS — E7800 Pure hypercholesterolemia, unspecified: Secondary | ICD-10-CM | POA: Insufficient documentation

## 2015-03-31 DIAGNOSIS — N281 Cyst of kidney, acquired: Secondary | ICD-10-CM

## 2015-03-31 DIAGNOSIS — K7689 Other specified diseases of liver: Secondary | ICD-10-CM | POA: Insufficient documentation

## 2015-03-31 DIAGNOSIS — R739 Hyperglycemia, unspecified: Secondary | ICD-10-CM

## 2015-03-31 HISTORY — DX: Cyst of kidney, acquired: N28.1

## 2015-03-31 HISTORY — DX: Hyperglycemia, unspecified: R73.9

## 2015-03-31 HISTORY — DX: Pure hypercholesterolemia, unspecified: E78.00

## 2015-03-31 HISTORY — DX: Essential (primary) hypertension: I10

## 2015-03-31 HISTORY — DX: Other specified diseases of liver: K76.89

## 2015-04-14 ENCOUNTER — Encounter (HOSPITAL_BASED_OUTPATIENT_CLINIC_OR_DEPARTMENT_OTHER): Payer: Self-pay

## 2015-04-14 ENCOUNTER — Emergency Department (HOSPITAL_BASED_OUTPATIENT_CLINIC_OR_DEPARTMENT_OTHER): Payer: Medicare Other

## 2015-04-14 ENCOUNTER — Inpatient Hospital Stay (HOSPITAL_BASED_OUTPATIENT_CLINIC_OR_DEPARTMENT_OTHER)
Admission: EM | Admit: 2015-04-14 | Discharge: 2015-04-18 | DRG: 331 | Disposition: A | Discharge status: Home / Self Care | Payer: Medicare Other | Attending: General Surgery | Admitting: General Surgery

## 2015-04-14 DIAGNOSIS — Z880 Allergy status to penicillin: Secondary | ICD-10-CM

## 2015-04-14 DIAGNOSIS — R1013 Epigastric pain: Secondary | ICD-10-CM | POA: Diagnosis present

## 2015-04-14 DIAGNOSIS — Z87891 Personal history of nicotine dependence: Secondary | ICD-10-CM

## 2015-04-14 DIAGNOSIS — Q433 Congenital malformations of intestinal fixation: Principal | ICD-10-CM

## 2015-04-14 DIAGNOSIS — N7011 Chronic salpingitis: Secondary | ICD-10-CM | POA: Diagnosis present

## 2015-04-14 DIAGNOSIS — Z886 Allergy status to analgesic agent status: Secondary | ICD-10-CM

## 2015-04-14 DIAGNOSIS — Z7982 Long term (current) use of aspirin: Secondary | ICD-10-CM

## 2015-04-14 DIAGNOSIS — Z885 Allergy status to narcotic agent status: Secondary | ICD-10-CM

## 2015-04-14 DIAGNOSIS — Z79899 Other long term (current) drug therapy: Secondary | ICD-10-CM

## 2015-04-14 DIAGNOSIS — E780 Pure hypercholesterolemia: Secondary | ICD-10-CM | POA: Diagnosis present

## 2015-04-14 DIAGNOSIS — I10 Essential (primary) hypertension: Secondary | ICD-10-CM | POA: Diagnosis present

## 2015-04-14 DIAGNOSIS — Z882 Allergy status to sulfonamides status: Secondary | ICD-10-CM

## 2015-04-14 DIAGNOSIS — R109 Unspecified abdominal pain: Secondary | ICD-10-CM

## 2015-04-14 DIAGNOSIS — E785 Hyperlipidemia, unspecified: Secondary | ICD-10-CM | POA: Diagnosis present

## 2015-04-14 DIAGNOSIS — Z9049 Acquired absence of other specified parts of digestive tract: Secondary | ICD-10-CM | POA: Diagnosis present

## 2015-04-14 DIAGNOSIS — Z881 Allergy status to other antibiotic agents status: Secondary | ICD-10-CM

## 2015-04-14 HISTORY — DX: Epigastric pain: R10.13

## 2015-04-14 LAB — CBC WITH DIFFERENTIAL/PLATELET
BASOS ABS: 0 10*3/uL (ref 0.0–0.1)
BASOS PCT: 0 % (ref 0–1)
EOS PCT: 1 % (ref 0–5)
Eosinophils Absolute: 0.1 10*3/uL (ref 0.0–0.7)
HCT: 40.7 % (ref 36.0–46.0)
Hemoglobin: 13.9 g/dL (ref 12.0–15.0)
LYMPHS PCT: 18 % (ref 12–46)
Lymphs Abs: 1.4 10*3/uL (ref 0.7–4.0)
MCH: 30.9 pg (ref 26.0–34.0)
MCHC: 34.2 g/dL (ref 30.0–36.0)
MCV: 90.4 fL (ref 78.0–100.0)
Monocytes Absolute: 0.7 10*3/uL (ref 0.1–1.0)
Monocytes Relative: 9 % (ref 3–12)
Neutro Abs: 5.6 10*3/uL (ref 1.7–7.7)
Neutrophils Relative %: 72 % (ref 43–77)
Platelets: 291 10*3/uL (ref 150–400)
RBC: 4.5 MIL/uL (ref 3.87–5.11)
RDW: 11.9 % (ref 11.5–15.5)
WBC: 7.8 10*3/uL (ref 4.0–10.5)

## 2015-04-14 LAB — COMPREHENSIVE METABOLIC PANEL
ALBUMIN: 4.4 g/dL (ref 3.5–5.0)
ALT: 21 U/L (ref 14–54)
AST: 25 U/L (ref 15–41)
Alkaline Phosphatase: 66 U/L (ref 38–126)
Anion gap: 11 (ref 5–15)
BUN: 8 mg/dL (ref 6–20)
CO2: 28 mmol/L (ref 22–32)
CREATININE: 0.68 mg/dL (ref 0.44–1.00)
Calcium: 9.4 mg/dL (ref 8.9–10.3)
Chloride: 99 mmol/L — ABNORMAL LOW (ref 101–111)
GFR calc Af Amer: >60 mL/min (ref >60)
GFR calc non Af Amer: >60 mL/min (ref >60)
GLUCOSE: 107 mg/dL — AB (ref 65–99)
Potassium: 3.5 mmol/L (ref 3.5–5.1)
Sodium: 138 mmol/L (ref 135–145)
Total Bilirubin: 0.7 mg/dL (ref 0.3–1.2)
Total Protein: 7.3 g/dL (ref 6.5–8.1)

## 2015-04-14 LAB — URINALYSIS, ROUTINE W REFLEX MICROSCOPIC
Bilirubin Urine: NEGATIVE
Glucose, UA: NEGATIVE mg/dL
HGB URINE DIPSTICK: NEGATIVE
Ketones, ur: NEGATIVE mg/dL
Nitrite: NEGATIVE
PROTEIN: NEGATIVE mg/dL
Specific Gravity, Urine: 1.004 — ABNORMAL LOW (ref 1.005–1.030)
UROBILINOGEN UA: 0.2 mg/dL (ref 0.0–1.0)
pH: 6.5 (ref 5.0–8.0)

## 2015-04-14 LAB — I-STAT CG4 LACTIC ACID, ED
LACTIC ACID, VENOUS: 0.48 mmol/L — AB (ref 0.5–2.0)
Lactic Acid, Venous: 0.47 mmol/L — ABNORMAL LOW (ref 0.5–2.0)

## 2015-04-14 LAB — URINE MICROSCOPIC-ADD ON

## 2015-04-14 LAB — LIPASE, BLOOD: Lipase: 19 U/L — ABNORMAL LOW (ref 22–51)

## 2015-04-14 MED ORDER — IOHEXOL 300 MG/ML  SOLN
100.0000 mL | Freq: Once | INTRAMUSCULAR | Status: AC | PRN
  Administered 2015-04-14: 100 mL via INTRAVENOUS

## 2015-04-14 MED ORDER — HYDROMORPHONE HCL 1 MG/ML IJ SOLN
1.0000 mg | Freq: Once | INTRAMUSCULAR | Status: AC
Start: 2015-04-14 — End: 2015-04-14
  Administered 2015-04-14: 1 mg via INTRAVENOUS
  Filled 2015-04-14: qty 1

## 2015-04-14 MED ORDER — IOHEXOL 300 MG/ML  SOLN
50.0000 mL | Freq: Once | INTRAMUSCULAR | Status: AC | PRN
Start: 2015-04-14 — End: 2015-04-14
  Administered 2015-04-14: 50 mL via ORAL

## 2015-04-14 MED ORDER — ACETAMINOPHEN 325 MG PO TABS
650.0000 mg | ORAL_TABLET | Freq: Four times a day (QID) | ORAL | Status: DC | PRN
  Administered 2015-04-15 (×2): 650 mg via ORAL
  Filled 2015-04-14 (×2): qty 2

## 2015-04-14 MED ORDER — SODIUM CHLORIDE 0.9 % IV SOLN
Freq: Once | INTRAVENOUS | Status: AC
  Administered 2015-04-14: 18:00:00 via INTRAVENOUS

## 2015-04-14 MED ORDER — ENOXAPARIN SODIUM 40 MG/0.4ML ~~LOC~~ SOLN: 40.0000 mg | SUBCUTANEOUS | Status: DC

## 2015-04-14 MED ORDER — PANTOPRAZOLE SODIUM 40 MG IV SOLR
40.0000 mg | Freq: Every day | INTRAVENOUS | Status: DC
  Administered 2015-04-15 (×2): 40 mg via INTRAVENOUS
  Filled 2015-04-14 (×5): qty 40

## 2015-04-14 MED ORDER — ONDANSETRON HCL 4 MG/2ML IJ SOLN
4.0000 mg | Freq: Four times a day (QID) | INTRAMUSCULAR | Status: DC | PRN
  Administered 2015-04-15: 4 mg via INTRAVENOUS
  Filled 2015-04-14: qty 2

## 2015-04-14 MED ORDER — ONDANSETRON HCL 4 MG/2ML IJ SOLN
4.0000 mg | Freq: Once | INTRAMUSCULAR | Status: AC
  Administered 2015-04-14: 4 mg via INTRAVENOUS
  Filled 2015-04-14: qty 2

## 2015-04-14 MED ORDER — KCL IN DEXTROSE-NACL 20-5-0.9 MEQ/L-%-% IV SOLN
INTRAVENOUS | Status: DC
  Administered 2015-04-15 – 2015-04-17 (×7): via INTRAVENOUS
  Filled 2015-04-14 (×11): qty 1000

## 2015-04-14 MED ORDER — ACETAMINOPHEN 650 MG RE SUPP: 650.0000 mg | Freq: Four times a day (QID) | RECTAL | Status: DC | PRN

## 2015-04-14 MED ORDER — ONDANSETRON 4 MG PO TBDP: 4.0000 mg | ORAL_TABLET | Freq: Four times a day (QID) | ORAL | Status: DC | PRN

## 2015-04-14 NOTE — ED Notes (Signed)
MD at bedside. 

## 2015-04-14 NOTE — ED Notes (Signed)
C/o abd pain since Tuesday-nausea-no v/d-denies urinary s/s-BM changes since GB surgery June 10

## 2015-04-14 NOTE — ED Provider Notes (Signed)
CSN: [id number]     Arrival date & time 04/14/15  1310 History   First MD Initiated Contact with Patient 04/14/15 1357     Chief Complaint  Patient presents with  . Abdominal Pain     (Consider location/radiation/quality/duration/timing/severity/associated sxs/prior Treatment) Patient is a 69 y.o. female presenting with abdominal pain.  Abdominal Pain Pain location:  Epigastric Pain quality: cramping and sharp   Pain severity:  Severe Onset quality:  Gradual Duration:  2 days (reports pain since GB surgery however worsened on Tuesday, more cramping pain severe pain) Timing:  Intermittent Progression:  Waxing and waning Chronicity:  New Context: previous surgery   Relieved by:  Nothing Worsened by:  Palpation, movement and eating Ineffective treatments:  None tried Associated symptoms: no chest pain, no cough, no fever, no shortness of breath and no sore throat   Risk factors: multiple surgeries     Past Medical History  Diagnosis Date  . Hypertension   . High cholesterol    Past Surgical History  Procedure Laterality Date  . Tonsillectomy    . Abdominal hysterectomy    . Tubal ligation  1980  . Cholecystectomy N/A 01/21/2015    Procedure: LAPAROSCOPIC CHOLECYSTECTOMY;  Surgeon: Jimmye Norman, MD;  Location: [facility] OR;  Service: General;  Laterality: N/A;   No family history on file. Social History  Substance Use Topics  . Smoking status: Former Smoker    Quit date: 08/13/2012  . Smokeless tobacco: None  . Alcohol Use: Yes     Comment: 1 beer/day   OB History    No data available     Review of Systems  Constitutional: Negative for fever.  HENT: Negative for sore throat.   Eyes: Negative for visual disturbance.  Respiratory: Negative for cough and shortness of breath.   Cardiovascular: Negative for chest pain.  Gastrointestinal: Positive for abdominal pain.  Genitourinary: Negative for difficulty urinating.  Musculoskeletal: Negative for back pain and neck pain.   Skin: Negative for rash.  Neurological: Negative for syncope and headaches.      Allergies  Avelox; Morphine and related; Penicillins; Sulfa antibiotics; and Aspirin  Home Medications   Prior to Admission medications   Medication Sig Start Date End Date Taking? Authorizing Provider  aspirin 81 MG tablet Take 81 mg by mouth daily.    Historical Provider, MD  benazepril-hydrochlorthiazide (LOTENSIN HCT) 10-12.5 MG per tablet Take 1 tablet by mouth daily.    Historical Provider, MD  Glucosamine-Chondroitin (OSTEO BI-FLEX REGULAR STRENGTH PO) Take 2 tablets by mouth daily.    Historical Provider, MD  Omega-3 Fatty Acids (FISH OIL) 1000 MG CAPS Take 2,000 mg by mouth daily.    Historical Provider, MD  pravastatin (PRAVACHOL) 10 MG tablet Take 10 mg by mouth daily.    Historical Provider, MD  traMADol (ULTRAM) 50 MG tablet Take 1-2 tablets (50-100 mg total) by mouth every 6 (six) hours as needed for moderate pain or severe pain. 01/21/15   Jimmye Norman, MD   BP 134/80 mmHg  Pulse 79  Temp(Src) 98.7 F (37.1 C) (Oral)  Resp 16  Ht  (1.575 m)  Wt 102 lb (46.267 kg)  BMI 18.65 kg/m2  SpO2 96% Physical Exam  Constitutional: She is oriented to person, place, and time. She appears well-developed and well-nourished. No distress.  HENT:  Head: Normocephalic and atraumatic.  Eyes: Conjunctivae and EOM are normal.  Neck: Normal range of motion.  Cardiovascular: Normal rate, regular rhythm, normal heart sounds and intact distal  pulses.  Exam reveals no gallop and no friction rub.   No murmur heard. Pulmonary/Chest: Effort normal and breath sounds normal. No respiratory distress. She has no wheezes. She has no rales.  Abdominal: Soft. She exhibits no distension. There is tenderness in the epigastric area. There is rigidity and guarding.  Musculoskeletal: She exhibits no edema or tenderness.  Neurological: She is alert and oriented to person, place, and time.  Skin: Skin is warm and dry.  No rash noted. She is not diaphoretic. No erythema.  Nursing note and vitals reviewed.   ED Course  Procedures (including critical care time) Labs Review Labs Reviewed  URINALYSIS, ROUTINE W REFLEX MICROSCOPIC (NOT AT [facility]) - Abnormal; Notable for the following:    Specific Gravity, Urine 1.004 (*)    Leukocytes, UA TRACE (*)    All other components within normal limits  COMPREHENSIVE METABOLIC PANEL - Abnormal; Notable for the following:    Chloride 99 (*)    Glucose, Bld 107 (*)    All other components within normal limits  LIPASE, BLOOD - Abnormal; Notable for the following:    Lipase 19 (*)    All other components within normal limits  I-STAT CG4 LACTIC ACID, ED - Abnormal; Notable for the following:    Lactic Acid, Venous 0.48 (*)    All other components within normal limits  CBC WITH DIFFERENTIAL/PLATELET  URINE MICROSCOPIC-ADD ON    Imaging Review Ct Abdomen Pelvis W Contrast  04/14/2015   CLINICAL DATA:  Upper abdominal pain for 2 days with nausea and vomiting.  EXAM: CT ABDOMEN AND PELVIS WITH CONTRAST  TECHNIQUE: Multidetector CT imaging of the abdomen and pelvis was performed using the standard protocol following bolus administration of intravenous contrast.  CONTRAST:  50mL OMNIPAQUE IOHEXOL 300 MG/ML SOLN, OMNIPAQUE IOHEXOL 300 MG/ML SOLN  COMPARISON:  01/16/2015  FINDINGS: Lower chest and abdominal wall: No acute findings in the lower chest. Right coronary atherosclerotic calcification. No abdominal wall hernia fluid collection.  Hepatobiliary: Few small presumed liver cysts.Cholecystectomy with mild intra and extrahepatic duct enlargement with no calcified choledocholithiasis. Given liver function tests this is likely reservoir effect.  Pancreas: Unremarkable.  Spleen: Unremarkable.  Adrenals/Urinary Tract: Negative adrenals. Numerous tiny cortical cysts. There is no chart indication of lithium use which can be associated with this pattern. No hydronephrosis or stone.  Unremarkable bladder.  Reproductive:Hysterectomy with symmetric ovaries.  Stomach/Bowel: There is twisting of the small and proximal colonic mesentery with ileocecal valve in the left upper quadrant. Similar orientation was likely present previously as the appendix was likely seen in the left upper quadrant. There is no bowel obstruction, ischemic change, or congestion. Large volume of colonic gas and stool. No appendicitis.  Vascular/Lymphatic: Diffuse atherosclerotic calcification. No acute finding No mass or adenopathy.  Peritoneal: Small pelvic fluid, presumably reactive.  Musculoskeletal: No acute abnormalities.  IMPRESSION: 1. 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 No associated bowel obstruction or vascular compromise. 2. Large volume colonic gas and stool.   Electronically Signed   By: Marnee Spring M.D.   On: 04/14/2015 16:51   I have personally reviewed and evaluated these images and lab results as part of my medical decision-making.   EKG Interpretation None      MDM   Final diagnoses:  Abdominal pain, unspecified abdominal location   69 year old female with a history of cholecystectomy June 10 with Dr. Lindie Spruce, hypertension, hyperlipidemia, hysterectomy presents as a transfer from  [facility] for concern of 2 days of worsening epigastric pain and CT scan concerning for mesenteric twisting which radiology reports is chronic appearing, however exam hx concerning for acute process/ ro volvulus..  Dr. Abbey Chatters of general surgery was consulted from [facility] and patient was transferred here for further evaluation.  Dr. Abbey Chatters was called at 7:40PM and is in OR but will come to the ED to evaluate the pt.  She is hemodynamically stable without significant pain at this time, however continued significant tenderness in the epigastric and LUQ. Declines pain and nausea medications.  Pt admitted in stable  condition.   Alvira Monday, MD 04/15/15 [phone]

## 2015-04-14 NOTE — ED Notes (Signed)
Pt returned from CT scan.  Continues to feel naseous and actively vomiting at bedside.  Pt states she does not want additional nausea medication at this time.

## 2015-04-14 NOTE — ED Notes (Signed)
Pt received to room 23 from [facility]; report from, [facility] RN

## 2015-04-14 NOTE — ED Notes (Signed)
Unable to void at this [place]

## 2015-04-14 NOTE — ED Notes (Signed)
Bed: ZO10 Expected date:  Expected time:  Means of arrival:  Comments: Med [facility]tr pt

## 2015-04-14 NOTE — H&P (Signed)
[name] is an 69 y.o. female.   Chief Complaint:  Persistent epigastric abdominal pain with nausea HPI:  This is a 69 year old female who presented to [facility] with the complaint of 48 hours of epigastric pain with distention and some nausea. She was tender in the epigastrium. White blood cell count was normal. Lactate was normal. Lipase was normal. Liver function tests were normal. She is status post cholecystectomy in June. CT scan was performed with the low findings reported. She vomited after taking the contrast. Surgical consultation was requested and she was transferred here for that reason. No fevers or chills. Has not had a bowel movement today. She feels distended.  Past Medical History  Diagnosis Date  . Hypertension   . High cholesterol     Past Surgical History  Procedure Laterality Date  . Tonsillectomy    . Abdominal hysterectomy    . Tubal ligation  1980  . Cholecystectomy N/A 01/21/2015    Procedure: LAPAROSCOPIC CHOLECYSTECTOMY;  Surgeon: Judeth Horn, MD;  Location: [place];  Service: General;  Laterality: N/A;    No family history on file. Social History:  reports that she quit smoking about 2 years ago. She does not have any smokeless tobacco history on file. She reports that she drinks alcohol. She reports that she does not use illicit drugs.  Allergies:  Allergies  Allergen Reactions  . Avelox [Moxifloxacin Hcl In Nacl] Swelling  . Morphine And Related Other (See Comments)    vomiting  . Penicillins     Skin peels   . Sulfa Antibiotics   . Aspirin Other (See Comments)    vomiting     (Not in a hospital admission)  Results for orders placed or performed during the hospital encounter of 04/14/15 (from the past 48 hour(s))  Urinalysis, Routine w reflex microscopic (not at [facility])     Status: Abnormal   Collection Time: 04/14/15  1:45 PM  Result Value Ref Range   Color, Urine YELLOW YELLOW   APPearance CLEAR CLEAR   Specific Gravity, Urine 1.004 (L) 1.005  - 1.030   pH 6.5 5.0 - 8.0   Glucose, UA NEGATIVE NEGATIVE mg/dL   Hgb urine dipstick NEGATIVE NEGATIVE   Bilirubin Urine NEGATIVE NEGATIVE   Ketones, ur NEGATIVE NEGATIVE mg/dL   Protein, ur NEGATIVE NEGATIVE mg/dL   Urobilinogen, UA 0.2 0.0 - 1.0 mg/dL   Nitrite NEGATIVE NEGATIVE   Leukocytes, UA TRACE (A) NEGATIVE  Urine microscopic-add on     Status: None   Collection Time: 04/14/15  1:45 PM  Result Value Ref Range   Squamous Epithelial / LPF RARE RARE   WBC, UA 0-2 <3 WBC/hpf   RBC / HPF 0-2 <3 RBC/hpf   Bacteria, UA RARE RARE  CBC with Differential     Status: None   Collection Time: 04/14/15  1:50 PM  Result Value Ref Range   WBC 7.8 4.0 - 10.5 K/uL   RBC 4.50 3.87 - 5.11 MIL/uL   Hemoglobin 13.9 12.0 - 15.0 g/dL   HCT 40.7 36.0 - 46.0 %   MCV 90.4 78.0 - 100.0 fL   MCH 30.9 26.0 - 34.0 pg   MCHC 34.2 30.0 - 36.0 g/dL   RDW 11.9 11.5 - 15.5 %   Platelets 291 150 - 400 K/uL   Neutrophils Relative % 72 43 - 77 %   Neutro Abs 5.6 1.7 - 7.7 K/uL   Lymphocytes Relative 18 12 - 46 %   Lymphs Abs 1.4  0.7 - 4.0 K/uL   Monocytes Relative 9 3 - 12 %   Monocytes Absolute 0.7 0.1 - 1.0 K/uL   Eosinophils Relative 1 0 - 5 %   Eosinophils Absolute 0.1 0.0 - 0.7 K/uL   Basophils Relative 0 0 - 1 %   Basophils Absolute 0.0 0.0 - 0.1 K/uL  Comprehensive metabolic panel     Status: Abnormal   Collection Time: 04/14/15  1:50 PM  Result Value Ref Range   Sodium 138 135 - 145 mmol/L   Potassium 3.5 3.5 - 5.1 mmol/L   Chloride 99 (L) 101 - 111 mmol/L   CO2 28 22 - 32 mmol/L   Glucose, Bld 107 (H) 65 - 99 mg/dL   BUN 8 6 - 20 mg/dL   Creatinine, Ser 0.68 0.44 - 1.00 mg/dL   Calcium 9.4 8.9 - 10.3 mg/dL   Total Protein 7.3 6.5 - 8.1 g/dL   Albumin 4.4 3.5 - 5.0 g/dL   AST 25 15 - 41 U/L   ALT 21 14 - 54 U/L   Alkaline Phosphatase 66 38 - 126 U/L   Total Bilirubin 0.7 0.3 - 1.2 mg/dL   GFR calc non Af Amer >60 >60 mL/min   GFR calc Af Amer >60 >60 mL/min    Comment:  (NOTE) The eGFR has been calculated using the CKD EPI equation. This calculation has not been validated in all clinical situations. eGFR's persistently <60 mL/min signify possible Chronic Kidney Disease.    Anion gap 11 5 - 15  Lipase, blood     Status: Abnormal   Collection Time: 04/14/15  1:50 PM  Result Value Ref Range   Lipase 19 (L) 22 - 51 U/L  I-Stat CG4 Lactic Acid, ED     Status: Abnormal   Collection Time: 04/14/15  5:22 PM  Result Value Ref Range   Lactic Acid, Venous 0.48 (L) 0.5 - 2.0 mmol/L  I-Stat CG4 Lactic Acid, ED     Status: Abnormal   Collection Time: 04/14/15  8:08 PM  Result Value Ref Range   Lactic Acid, Venous 0.47 (L) 0.5 - 2.0 mmol/L   Ct Abdomen Pelvis W Contrast  04/14/2015   CLINICAL DATA:  Upper abdominal pain for 2 days with nausea and vomiting.  EXAM: CT ABDOMEN AND PELVIS WITH CONTRAST  TECHNIQUE: Multidetector CT imaging of the abdomen and pelvis was performed using the standard protocol following bolus administration of intravenous contrast.  CONTRAST:  22m OMNIPAQUE IOHEXOL 300 MG/ML SOLN, 1029mOMNIPAQUE IOHEXOL 300 MG/ML SOLN  COMPARISON:  01/16/2015  FINDINGS: Lower chest and abdominal wall: No acute findings in the lower chest. Right coronary atherosclerotic calcification. No abdominal wall hernia fluid collection.  Hepatobiliary: Few small presumed liver cysts.Cholecystectomy with mild intra and extrahepatic duct enlargement with no calcified choledocholithiasis. Given liver function tests this is likely reservoir effect.  Pancreas: Unremarkable.  Spleen: Unremarkable.  Adrenals/Urinary Tract: Negative adrenals. Numerous tiny cortical cysts. There is no chart indication of lithium use which can be associated with this pattern. No hydronephrosis or stone. Unremarkable bladder.  Reproductive:Hysterectomy with symmetric ovaries.  Stomach/Bowel: There is twisting of the small and proximal colonic mesentery with ileocecal valve in the left upper quadrant.  Similar orientation was likely present previously as the appendix was likely seen in the left upper quadrant. There is no bowel obstruction, ischemic change, or congestion. Large volume of colonic gas and stool. No appendicitis.  Vascular/Lymphatic: Diffuse atherosclerotic calcification. No acute finding No mass or adenopathy.  Peritoneal: Small pelvic fluid, presumably reactive.  Musculoskeletal: No acute abnormalities.  IMPRESSION: 1. 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 No associated bowel obstruction or vascular compromise. 2. Large volume colonic gas and stool.   Electronically Signed   By: Monte Fantasia M.D.   On: 04/14/2015 16:51    Review of Systems  Constitutional: Negative for fever, chills and weight loss.  Respiratory: Negative for shortness of breath.   Cardiovascular: Negative for chest pain.  Gastrointestinal: Negative for nausea, vomiting, abdominal pain and blood in stool.  Genitourinary: Negative for dysuria and hematuria.    Blood pressure 134/80, pulse 79, temperature 98.7 F (37.1 C), temperature source Oral, resp. rate 16, height 5' 2"  (1.575 m), weight 46.267 kg (102 lb), SpO2 96 %. Physical Exam  Constitutional: She appears well-developed and well-nourished. No distress.  HENT:  Head: Normocephalic and atraumatic.  Eyes: No scleral icterus.  Cardiovascular: Normal rate and regular rhythm.   Respiratory: Effort normal and breath sounds normal.  GI: Soft. There is tenderness (In epigastrium). There is guarding (In epigastric region).  Multiple abdominal scars are present. No hernias noted.  Musculoskeletal: She exhibits no edema.  Neurological: She is alert.  Skin: Skin is warm and dry.     Assessment/Plan Epigastric abdominal pain with nausea vomiting. I reviewed the CT scan myself and do not see any evidence of malrotation or volvulus. I spoke with the radiologist about this as well. She does not  have an acute abdomen or indication for acute surgery at this time. Could have acute gastritis.  Plan: Admit for observation. IV proton pump inhibitor. Bowel rest; reexamination and recheck labs in the morning.  Aryav Wimberly J 04/14/2015, 11:12 PM

## 2015-04-14 NOTE — ED Provider Notes (Signed)
CSN: [id number]     Arrival date & time 04/14/15  1310 History   First MD Initiated Contact with Patient 04/14/15 1357     Chief Complaint  Patient presents with  . Abdominal Pain     (Consider location/radiation/quality/duration/timing/severity/associated sxs/prior Treatment) HPI Comments: Patient is a 69 year old female with past medical history of hypertension, high cholesterol, and recent gallbladder surgery in June of this year. She presents for evaluation of periumbilical/epigastric abdominal pain that started 2 nights ago. She describes the pain as a cramping and has worsened since its onset. She denies any vomiting or diarrhea. She denies any fevers or chills. Her pain worsens with eating. She admits to drinking one beer every day.  Patient is a 69 y.o. female presenting with abdominal pain. The history is provided by the patient.  Abdominal Pain Pain location:  Epigastric and periumbilical Pain quality: cramping   Pain radiates to:  Does not radiate Pain severity:  Moderate Onset quality:  Sudden Duration:  2 days Timing:  Constant Progression:  Worsening Chronicity:  New Relieved by:  Nothing Worsened by:  Eating, movement and palpation Ineffective treatments:  None tried Associated symptoms: no chest pain and no fever     Past Medical History  Diagnosis Date  . Hypertension   . High cholesterol    Past Surgical History  Procedure Laterality Date  . Tonsillectomy    . Abdominal hysterectomy    . Tubal ligation  1980  . Cholecystectomy N/A 01/21/2015    Procedure: LAPAROSCOPIC CHOLECYSTECTOMY;  Surgeon: Jimmye Norman, MD;  Location: [facility] OR;  Service: General;  Laterality: N/A;   No family history on file. Social History  Substance Use Topics  . Smoking status: Former Smoker    Quit date: 08/13/2012  . Smokeless tobacco: None  . Alcohol Use: Yes     Comment: 1 beer/day   OB History    No data available     Review of Systems  Constitutional: Negative for  fever.  Cardiovascular: Negative for chest pain.  Gastrointestinal: Positive for abdominal pain.  All other systems reviewed and are negative.     Allergies  Avelox; Morphine and related; Penicillins; Sulfa antibiotics; and Aspirin  Home Medications   Prior to Admission medications   Medication Sig Start Date End Date Taking? Authorizing Provider  aspirin 81 MG tablet Take 81 mg by mouth daily.    Historical Provider, MD  benazepril-hydrochlorthiazide (LOTENSIN HCT) 10-12.5 MG per tablet Take 1 tablet by mouth daily.    Historical Provider, MD  Glucosamine-Chondroitin (OSTEO BI-FLEX REGULAR STRENGTH PO) Take 2 tablets by mouth daily.    Historical Provider, MD  Omega-3 Fatty Acids (FISH OIL) 1000 MG CAPS Take 2,000 mg by mouth daily.    Historical Provider, MD  pravastatin (PRAVACHOL) 10 MG tablet Take 10 mg by mouth daily.    Historical Provider, MD  traMADol (ULTRAM) 50 MG tablet Take 1-2 tablets (50-100 mg total) by mouth every 6 (six) hours as needed for moderate pain or severe pain. 01/21/15   Jimmye Norman, MD   BP 148/98 mmHg  Pulse 94  Temp(Src) 98.3 F (36.8 C) (Oral)  Resp 20  Ht 5\' 2"  (1.575 m)  Wt 102 lb (46.267 kg)  BMI 18.65 kg/m2  SpO2 95% Physical Exam  Constitutional: She is oriented to person, place, and time. She appears well-developed and well-nourished. No distress.  HENT:  Head: Normocephalic and atraumatic.  Neck: Normal range of motion. Neck supple.  Cardiovascular: Normal rate and regular  rhythm.  Exam reveals no gallop and no friction rub.   No murmur heard. Pulmonary/Chest: Effort normal and breath sounds normal. No respiratory distress. She has no wheezes.  Abdominal: Soft. Bowel sounds are normal. She exhibits no distension and no mass. There is tenderness. There is no rebound and no guarding.  There is tenderness to palpation in the epigastric and periumbilical region.  Musculoskeletal: Normal range of motion.  Neurological: She is alert and  oriented to person, place, and time.  Skin: Skin is warm and dry. She is not diaphoretic.  Nursing note and vitals reviewed.   ED Course  Procedures (including critical care time) Labs Review Labs Reviewed  URINALYSIS, ROUTINE W REFLEX MICROSCOPIC (NOT AT [facility])  CBC WITH DIFFERENTIAL/PLATELET  COMPREHENSIVE METABOLIC PANEL  LIPASE, BLOOD    Imaging Review No results found. I have personally reviewed and evaluated these images and lab results as part of my medical decision-making.   EKG Interpretation None      MDM   Final diagnoses:  None    Patient presents with complaints of epigastric abdominal pain. Her laboratory studies are reassuring, however due to the degree of her discomfort, a CT scan will be obtained. The results of this are pending. The care will be signed out to Dr. Manus Gunning at shift change. He will obtain the results of the CT scan and determine the final disposition.    Geoffery Lyons, MD 04/18/15 [phone]

## 2015-04-14 NOTE — ED Provider Notes (Signed)
Care assumed in sign out.  Patient with left side abdominal pain, cholecystectomy in June. She's had vomiting since drinking CT contrast. Labs are reassuring. Lactate normal.  CT results reviewed with patient.   She continues to have significant tenderness in the left side of her abdomen CT results discussed with Dr. Abbey Chatters. He will evaluate patient [name] emergency department. Dr. Dalene Seltzer accepts patient in transfer.  Glynn Octave, MD 04/14/15 [phone]

## 2015-04-15 ENCOUNTER — Encounter (HOSPITAL_COMMUNITY): Admission: EM | Disposition: A | Discharge status: Home / Self Care | Payer: Self-pay | Source: Home / Self Care

## 2015-04-15 ENCOUNTER — Encounter (HOSPITAL_COMMUNITY): Payer: Self-pay | Admitting: Certified Registered"

## 2015-04-15 ENCOUNTER — Inpatient Hospital Stay (HOSPITAL_COMMUNITY): Payer: Medicare Other | Admitting: Anesthesiology

## 2015-04-15 DIAGNOSIS — E785 Hyperlipidemia, unspecified: Secondary | ICD-10-CM | POA: Diagnosis present

## 2015-04-15 DIAGNOSIS — Z9049 Acquired absence of other specified parts of digestive tract: Secondary | ICD-10-CM | POA: Diagnosis present

## 2015-04-15 DIAGNOSIS — E780 Pure hypercholesterolemia: Secondary | ICD-10-CM | POA: Diagnosis not present

## 2015-04-15 DIAGNOSIS — N7011 Chronic salpingitis: Secondary | ICD-10-CM | POA: Diagnosis not present

## 2015-04-15 DIAGNOSIS — Z881 Allergy status to other antibiotic agents status: Secondary | ICD-10-CM | POA: Diagnosis not present

## 2015-04-15 DIAGNOSIS — Z7982 Long term (current) use of aspirin: Secondary | ICD-10-CM | POA: Diagnosis not present

## 2015-04-15 DIAGNOSIS — Z885 Allergy status to narcotic agent status: Secondary | ICD-10-CM | POA: Diagnosis not present

## 2015-04-15 DIAGNOSIS — Z882 Allergy status to sulfonamides status: Secondary | ICD-10-CM | POA: Diagnosis not present

## 2015-04-15 DIAGNOSIS — I10 Essential (primary) hypertension: Secondary | ICD-10-CM | POA: Diagnosis not present

## 2015-04-15 DIAGNOSIS — Q433 Congenital malformations of intestinal fixation: Secondary | ICD-10-CM | POA: Diagnosis not present

## 2015-04-15 DIAGNOSIS — Z79899 Other long term (current) drug therapy: Secondary | ICD-10-CM | POA: Diagnosis not present

## 2015-04-15 DIAGNOSIS — Z886 Allergy status to analgesic agent status: Secondary | ICD-10-CM | POA: Diagnosis not present

## 2015-04-15 DIAGNOSIS — Z880 Allergy status to penicillin: Secondary | ICD-10-CM | POA: Diagnosis not present

## 2015-04-15 DIAGNOSIS — Z87891 Personal history of nicotine dependence: Secondary | ICD-10-CM | POA: Diagnosis not present

## 2015-04-15 DIAGNOSIS — R109 Unspecified abdominal pain: Secondary | ICD-10-CM | POA: Diagnosis present

## 2015-04-15 HISTORY — PX: LAPAROSCOPY: SHX197

## 2015-04-15 LAB — TYPE AND SCREEN
ABO/RH(D): A POS
Antibody Screen: NEGATIVE

## 2015-04-15 LAB — BASIC METABOLIC PANEL
ANION GAP: 8 (ref 5–15)
BUN: 7 mg/dL (ref 6–20)
CALCIUM: 8.7 mg/dL — AB (ref 8.9–10.3)
CO2: 26 mmol/L (ref 22–32)
Chloride: 104 mmol/L (ref 101–111)
Creatinine, Ser: 0.58 mg/dL (ref 0.44–1.00)
Glucose, Bld: 123 mg/dL — ABNORMAL HIGH (ref 65–99)
Potassium: 3.5 mmol/L (ref 3.5–5.1)
SODIUM: 138 mmol/L (ref 135–145)

## 2015-04-15 LAB — CBC
HCT: 36.2 % (ref 36.0–46.0)
HEMOGLOBIN: 12.3 g/dL (ref 12.0–15.0)
MCH: 31.1 pg (ref 26.0–34.0)
MCHC: 34 g/dL (ref 30.0–36.0)
MCV: 91.6 fL (ref 78.0–100.0)
PLATELETS: 243 10*3/uL (ref 150–400)
RBC: 3.95 MIL/uL (ref 3.87–5.11)
RDW: 12.2 % (ref 11.5–15.5)
WBC: 6.2 10*3/uL (ref 4.0–10.5)

## 2015-04-15 LAB — ABO/RH: ABO/RH(D): A POS

## 2015-04-15 SURGERY — LAPAROSCOPY, DIAGNOSTIC
Anesthesia: General | Site: Abdomen

## 2015-04-15 MED ORDER — DEXTROSE 5 % IV SOLN: 2.0000 g | INTRAVENOUS | Status: DC

## 2015-04-15 MED ORDER — HYDROMORPHONE HCL 1 MG/ML IJ SOLN
INTRAMUSCULAR | Status: AC
  Filled 2015-04-15: qty 1

## 2015-04-15 MED ORDER — DEXAMETHASONE SODIUM PHOSPHATE 10 MG/ML IJ SOLN
INTRAMUSCULAR | Status: AC
  Filled 2015-04-15: qty 1

## 2015-04-15 MED ORDER — HYDROMORPHONE HCL 1 MG/ML IJ SOLN
0.2500 mg | INTRAMUSCULAR | Status: DC | PRN
  Administered 2015-04-15 (×2): 0.5 mg via INTRAVENOUS

## 2015-04-15 MED ORDER — MIDAZOLAM HCL 2 MG/2ML IJ SOLN
INTRAMUSCULAR | Status: AC
  Filled 2015-04-15: qty 4

## 2015-04-15 MED ORDER — SUCCINYLCHOLINE CHLORIDE 20 MG/ML IJ SOLN
INTRAMUSCULAR | Status: DC | PRN
  Administered 2015-04-15: 80 mg via INTRAVENOUS

## 2015-04-15 MED ORDER — BUPIVACAINE-EPINEPHRINE (PF) 0.25% -1:200000 IJ SOLN
INTRAMUSCULAR | Status: AC
  Filled 2015-04-15: qty 60

## 2015-04-15 MED ORDER — ROCURONIUM BROMIDE 100 MG/10ML IV SOLN
INTRAVENOUS | Status: AC
  Filled 2015-04-15: qty 1

## 2015-04-15 MED ORDER — DIPHENHYDRAMINE HCL 50 MG/ML IJ SOLN: 12.5000 mg | Freq: Four times a day (QID) | INTRAMUSCULAR | Status: DC | PRN

## 2015-04-15 MED ORDER — NEOSTIGMINE METHYLSULFATE 10 MG/10ML IV SOLN
INTRAVENOUS | Status: DC | PRN
  Administered 2015-04-15: 2.5 mg via INTRAVENOUS

## 2015-04-15 MED ORDER — MAGIC MOUTHWASH
15.0000 mL | Freq: Four times a day (QID) | ORAL | Status: DC | PRN
  Filled 2015-04-15: qty 15

## 2015-04-15 MED ORDER — LIP MEDEX EX OINT
1.0000 "application " | TOPICAL_OINTMENT | Freq: Two times a day (BID) | CUTANEOUS | Status: DC
  Administered 2015-04-15 – 2015-04-17 (×5): 1 via TOPICAL
  Filled 2015-04-15: qty 7

## 2015-04-15 MED ORDER — MIDAZOLAM HCL 2 MG/2ML IJ SOLN
INTRAMUSCULAR | Status: DC | PRN
  Administered 2015-04-15: 2 mg via INTRAVENOUS

## 2015-04-15 MED ORDER — CLINDAMYCIN PHOSPHATE 900 MG/50ML IV SOLN
900.0000 mg | INTRAVENOUS | Status: AC
  Administered 2015-04-15: 900 mg via INTRAVENOUS

## 2015-04-15 MED ORDER — MEPERIDINE HCL 50 MG/ML IJ SOLN: 6.2500 mg | INTRAMUSCULAR | Status: DC | PRN

## 2015-04-15 MED ORDER — KETOROLAC TROMETHAMINE 15 MG/ML IJ SOLN
INTRAMUSCULAR | Status: DC | PRN
  Administered 2015-04-15: 15 mg via INTRAVENOUS

## 2015-04-15 MED ORDER — PROMETHAZINE HCL 25 MG/ML IJ SOLN
6.2500 mg | INTRAMUSCULAR | Status: DC | PRN
Start: 2015-04-15 — End: 2015-04-15

## 2015-04-15 MED ORDER — METOPROLOL TARTRATE 1 MG/ML IV SOLN
5.0000 mg | Freq: Four times a day (QID) | INTRAVENOUS | Status: DC | PRN
Start: 2015-04-15 — End: 2015-04-18
  Filled 2015-04-15: qty 5

## 2015-04-15 MED ORDER — EPHEDRINE SULFATE 50 MG/ML IJ SOLN
INTRAMUSCULAR | Status: DC | PRN
  Administered 2015-04-15: 10 mg via INTRAVENOUS

## 2015-04-15 MED ORDER — GLYCOPYRROLATE 0.2 MG/ML IJ SOLN
INTRAMUSCULAR | Status: AC
Start: 2015-04-15 — End: 2015-04-15
  Filled 2015-04-15: qty 3

## 2015-04-15 MED ORDER — KETOROLAC TROMETHAMINE 30 MG/ML IJ SOLN
INTRAMUSCULAR | Status: AC
  Filled 2015-04-15: qty 1

## 2015-04-15 MED ORDER — BUPIVACAINE HCL (PF) 0.25 % IJ SOLN
INTRAMUSCULAR | Status: AC
  Filled 2015-04-15: qty 60

## 2015-04-15 MED ORDER — GLYCOPYRROLATE 0.2 MG/ML IJ SOLN
INTRAMUSCULAR | Status: DC | PRN
  Administered 2015-04-15: .3 mg via INTRAVENOUS

## 2015-04-15 MED ORDER — FENTANYL CITRATE (PF) 100 MCG/2ML IJ SOLN
12.5000 ug | INTRAMUSCULAR | Status: DC | PRN
  Administered 2015-04-15 – 2015-04-16 (×3): 12.5 ug via INTRAVENOUS
  Administered 2015-04-16 – 2015-04-17 (×2): 25 ug via INTRAVENOUS
  Filled 2015-04-15 (×5): qty 2

## 2015-04-15 MED ORDER — CLINDAMYCIN PHOSPHATE 900 MG/50ML IV SOLN
INTRAVENOUS | Status: AC
  Filled 2015-04-15: qty 50

## 2015-04-15 MED ORDER — HYDROCORTISONE 1 % EX CREA
1.0000 "application " | TOPICAL_CREAM | Freq: Two times a day (BID) | CUTANEOUS | Status: AC
  Administered 2015-04-16 – 2015-04-17 (×2): 1 via TOPICAL
  Filled 2015-04-15 (×2): qty 28

## 2015-04-15 MED ORDER — NEOSTIGMINE METHYLSULFATE 10 MG/10ML IV SOLN
INTRAVENOUS | Status: AC
  Filled 2015-04-15: qty 1

## 2015-04-15 MED ORDER — ASPIRIN EC 81 MG PO TBEC
81.0000 mg | DELAYED_RELEASE_TABLET | Freq: Every day | ORAL | Status: DC
  Administered 2015-04-15: 81 mg via ORAL
  Filled 2015-04-15 (×4): qty 1

## 2015-04-15 MED ORDER — MENTHOL 3 MG MT LOZG
1.0000 | LOZENGE | OROMUCOSAL | Status: DC | PRN
  Filled 2015-04-15: qty 9

## 2015-04-15 MED ORDER — DEXAMETHASONE SODIUM PHOSPHATE 10 MG/ML IJ SOLN
INTRAMUSCULAR | Status: DC | PRN
  Administered 2015-04-15: 10 mg via INTRAVENOUS

## 2015-04-15 MED ORDER — AZTREONAM 2 G IJ SOLR
2.0000 g | INTRAMUSCULAR | Status: AC
  Administered 2015-04-15: 2 g via INTRAVENOUS
  Filled 2015-04-15: qty 2

## 2015-04-15 MED ORDER — ALUM & MAG HYDROXIDE-SIMETH 200-200-20 MG/5ML PO SUSP: 30.0000 mL | Freq: Four times a day (QID) | ORAL | Status: DC | PRN

## 2015-04-15 MED ORDER — ONDANSETRON HCL 4 MG/2ML IJ SOLN
INTRAMUSCULAR | Status: DC | PRN
  Administered 2015-04-15: 4 mg via INTRAVENOUS

## 2015-04-15 MED ORDER — ONDANSETRON HCL 4 MG/2ML IJ SOLN
INTRAMUSCULAR | Status: AC
  Filled 2015-04-15: qty 2

## 2015-04-15 MED ORDER — PHENOL 1.4 % MT LIQD
2.0000 | OROMUCOSAL | Status: DC | PRN
  Filled 2015-04-15: qty 177

## 2015-04-15 MED ORDER — LACTATED RINGERS IV BOLUS (SEPSIS): 1000.0000 mL | Freq: Three times a day (TID) | INTRAVENOUS | Status: AC | PRN

## 2015-04-15 MED ORDER — PROPOFOL 10 MG/ML IV BOLUS
INTRAVENOUS | Status: AC
  Filled 2015-04-15: qty 20

## 2015-04-15 MED ORDER — LACTATED RINGERS IV SOLN: INTRAVENOUS | Status: DC

## 2015-04-15 MED ORDER — FENTANYL CITRATE (PF) 250 MCG/5ML IJ SOLN
INTRAMUSCULAR | Status: AC
  Filled 2015-04-15: qty 25

## 2015-04-15 MED ORDER — LACTATED RINGERS IV SOLN
INTRAVENOUS | Status: AC
  Administered 2015-04-15: 1000 mL via INTRAVENOUS

## 2015-04-15 MED ORDER — ENOXAPARIN SODIUM 60 MG/0.6ML ~~LOC~~ SOLN
40.0000 mg | SUBCUTANEOUS | Status: DC
  Administered 2015-04-15: 40 mg via SUBCUTANEOUS
  Filled 2015-04-15 (×3): qty 0.6

## 2015-04-15 MED ORDER — FENTANYL CITRATE (PF) 100 MCG/2ML IJ SOLN
INTRAMUSCULAR | Status: DC | PRN
  Administered 2015-04-15: 50 ug via INTRAVENOUS
  Administered 2015-04-15: 100 ug via INTRAVENOUS
  Administered 2015-04-15: 50 ug via INTRAVENOUS

## 2015-04-15 MED ORDER — BISACODYL 10 MG RE SUPP
10.0000 mg | Freq: Two times a day (BID) | RECTAL | Status: DC
  Administered 2015-04-15: 10 mg via RECTAL
  Filled 2015-04-15 (×2): qty 1

## 2015-04-15 MED ORDER — ROCURONIUM BROMIDE 100 MG/10ML IV SOLN
INTRAVENOUS | Status: DC | PRN
  Administered 2015-04-15: 20 mg via INTRAVENOUS

## 2015-04-15 MED ORDER — PROPOFOL 10 MG/ML IV BOLUS
INTRAVENOUS | Status: DC | PRN
  Administered 2015-04-15: 130 mg via INTRAVENOUS

## 2015-04-15 MED ORDER — BUPIVACAINE-EPINEPHRINE (PF) 0.25% -1:200000 IJ SOLN
INTRAMUSCULAR | Status: DC | PRN
  Administered 2015-04-15: 10 mL via PERINEURAL

## 2015-04-15 SURGICAL SUPPLY — 41 items
CATH KIT ON-Q SILVERSOAK 7.5IN (CATHETERS) IMPLANT
COVER SURGICAL LIGHT HANDLE (MISCELLANEOUS) ×3 IMPLANT
DECANTER SPIKE VIAL GLASS SM (MISCELLANEOUS) ×3 IMPLANT
DRAPE LAPAROSCOPIC ABDOMINAL (DRAPES) ×3 IMPLANT
DRAPE UTILITY XL STRL (DRAPES) ×3 IMPLANT
DRAPE WARM FLUID 44X44 (DRAPE) ×3 IMPLANT
DRSG TEGADERM 2-3/8X2-3/4 SM (GAUZE/BANDAGES/DRESSINGS) ×3 IMPLANT
DRSG TEGADERM 4X4.75 (GAUZE/BANDAGES/DRESSINGS) IMPLANT
ELECT REM PT RETURN 9FT ADLT (ELECTROSURGICAL) ×3
ELECTRODE REM PT RTRN 9FT ADLT (ELECTROSURGICAL) ×1 IMPLANT
GAUZE SPONGE 2X2 8PLY STRL LF (GAUZE/BANDAGES/DRESSINGS) ×1 IMPLANT
GLOVE ECLIPSE 8.0 STRL XLNG CF (GLOVE) ×3 IMPLANT
GLOVE INDICATOR 8.0 STRL GRN (GLOVE) ×3 IMPLANT
GOWN STRL REUS W/TWL XL LVL3 (GOWN DISPOSABLE) ×9 IMPLANT
KIT BASIN OR (CUSTOM PROCEDURE TRAY) ×3 IMPLANT
SCISSORS LAP 5X35 DISP (ENDOMECHANICALS) ×3 IMPLANT
SEALER TISSUE G2 STRG ARTC 35C (ENDOMECHANICALS) IMPLANT
SET IRRIG TUBING LAPAROSCOPIC (IRRIGATION / IRRIGATOR) IMPLANT
SLEEVE XCEL OPT CAN 5 100 (ENDOMECHANICALS) ×6 IMPLANT
SPONGE GAUZE 2X2 STER 10/PKG (GAUZE/BANDAGES/DRESSINGS) ×2
SUT DVC VLOC 180 2-0 12IN GS21 (SUTURE) ×3
SUT MNCRL AB 4-0 PS2 18 (SUTURE) ×3 IMPLANT
SUT SILK 2 0 (SUTURE)
SUT SILK 2 0 SH CR/8 (SUTURE) IMPLANT
SUT SILK 2-0 18XBRD TIE 12 (SUTURE) IMPLANT
SUT SILK 3 0 (SUTURE)
SUT SILK 3 0 SH CR/8 (SUTURE) IMPLANT
SUT SILK 3-0 18XBRD TIE 12 (SUTURE) IMPLANT
SUT VIC AB 3-0 SH 18 (SUTURE) ×3 IMPLANT
SUT VIC AB 3-0 SH 27 (SUTURE) ×2
SUT VIC AB 3-0 SH 27XBRD (SUTURE) ×1 IMPLANT
SUTURE DVC VL 180 2-0 12INGS21 (SUTURE) ×1 IMPLANT
TOWEL OR 17X26 10 PK STRL BLUE (TOWEL DISPOSABLE) ×3 IMPLANT
TOWEL OR NON WOVEN STRL DISP B (DISPOSABLE) ×3 IMPLANT
TRAY FOLEY W/METER SILVER 14FR (SET/KITS/TRAYS/PACK) ×3 IMPLANT
TRAY FOLEY W/METER SILVER 16FR (SET/KITS/TRAYS/PACK) IMPLANT
TRAY LAPAROSCOPIC (CUSTOM PROCEDURE TRAY) ×3 IMPLANT
TROCAR BLADELESS OPT 5 100 (ENDOMECHANICALS) ×3 IMPLANT
TROCAR XCEL NON-BLD 11X100MML (ENDOMECHANICALS) IMPLANT
TUBING INSUFFLATION 10FT LAP (TUBING) ×3 IMPLANT
TUNNELER SHEATH ON-Q 16GX12 DP (PAIN MANAGEMENT) IMPLANT

## 2015-04-15 NOTE — Progress Notes (Signed)
Patient ID: [name], female   DOB: 1946-06-10, 69 y.o.   MRN: [id number]  Pt vomited. Had diarrhea Worsening abdomina pain. Denies ill close contacts. Husband in the room.  Exam unchanged, remains tender on exam with voluntary guarding.   Will proceed with a diagnostic laparoscopy.  Surgical risks discussed including but not limited to infection, bleeding, anesthesia risks, open surgery, injury to surrounding structures.  The patient verbalizes understanding and wishes to proceed.  Adler Chartrand, ANP-BC

## 2015-04-15 NOTE — Discharge Instructions (Signed)
LAPAROSCOPIC SURGERY: POST OP INSTRUCTIONS ° °1. DIET: Follow a light bland diet the first 24 hours after arrival home, such as soup, liquids, crackers, etc.  Be sure to include lots of fluids daily.  Avoid fast food or heavy meals as your are more likely to get nauseated.  Eat a low fat the next few days after surgery.   °2. Take your usually prescribed home medications unless otherwise directed. °3. PAIN CONTROL: °a. Pain is best controlled by a usual combination of three different methods TOGETHER: °i. Ice/Heat °ii. Over the counter pain medication °iii. Prescription pain medication °b. Most patients will experience some swelling and bruising around the incisions.  Ice packs or heating pads (30-60 minutes up to 6 times a day) will help. Use ice for the first few days to help decrease swelling and bruising, then switch to heat to help relax tight/sore spots and speed recovery.  Some people prefer to use ice alone, heat alone, alternating between ice & heat.  Experiment to what works for you.  Swelling and bruising can take several weeks to resolve.   °c. It is helpful to take an over-the-counter pain medication regularly for the first few weeks.  Choose one of the following that works best for you: °i. Naproxen (Aleve, etc)  Two 220mg tabs twice a day °ii. Ibuprofen (Advil, etc) Three 200mg tabs four times a day (every meal & bedtime) °iii. Acetaminophen (Tylenol, etc) 500-650mg four times a day (every meal & bedtime) °d. A  prescription for pain medication (such as oxycodone, hydrocodone, etc) should be given to you upon discharge.  Take your pain medication as prescribed.  °i. If you are having problems/concerns with the prescription medicine (does not control pain, nausea, vomiting, rash, itching, etc), please call us [phone] to see if we need to switch you to a different pain medicine that will work better for you and/or control your side effect better. °ii. If you need a refill on your pain medication,  please contact your pharmacy.  They will contact our office to request authorization. Prescriptions will not be filled after 5 pm or on week-ends. °4. Avoid getting constipated.  Between the surgery and the pain medications, it is common to experience some constipation.  Increasing fluid intake and taking a fiber supplement (such as Metamucil, Citrucel, FiberCon, MiraLax, etc) 1-2 times a day regularly will usually help prevent this problem from occurring.  A mild laxative (prune juice, Milk of Magnesia, MiraLax, etc) should be taken according to package directions if there are no bowel movements after 48 hours.   °5. Watch out for diarrhea.  If you have many loose bowel movements, simplify your diet to bland foods & liquids for a few days.  Stop any stool softeners and decrease your fiber supplement.  Switching to mild anti-diarrheal medications (Kayopectate, Pepto Bismol) can help.  If this worsens or does not improve, please call us. °6. Wash / shower every day.  You may shower over the dressings as they are waterproof.  Continue to shower over incision(s) after the dressing is off. °7. Remove your waterproof bandages 5 days after surgery.  You may leave the incision open to air.  You may replace a dressing/Band-Aid to cover the incision for comfort if you wish.  °8. ACTIVITIES as tolerated:   °a. You may resume regular (light) daily activities beginning the next day--such as daily self-care, walking, climbing stairs--gradually increasing activities as tolerated.  If you can walk 30 minutes without difficulty, it   is safe to try more intense activity such as jogging, treadmill, bicycling, low-impact aerobics, swimming, etc. °b. Save the most intensive and strenuous activity for last such as sit-ups, heavy lifting, contact sports, etc  Refrain from any heavy lifting or straining until you are off narcotics for pain control.   °c. DO NOT PUSH THROUGH PAIN.  Let pain be your guide: If it hurts to do something, don't  do it.  Pain is your body warning you to avoid that activity for another week until the pain goes down. °d. You may drive when you are no longer taking prescription pain medication, you can comfortably wear a seatbelt, and you can safely maneuver your car and apply brakes. °e. You may have sexual intercourse when it is comfortable.  °9. FOLLOW UP in our office °a. Please call [facility] at [phone] to set up an appointment to see your surgeon in the office for a follow-up appointment approximately 2-3 weeks after your surgery. °b. Make sure that you call for this appointment the day you arrive home to insure a convenient appointment time. °10. IF YOU HAVE DISABILITY OR FAMILY LEAVE FORMS, BRING THEM TO THE OFFICE FOR PROCESSING.  DO NOT GIVE THEM TO YOUR DOCTOR. ° ° °WHEN TO CALL US [phone]: °1. Poor pain control °2. Reactions / problems with new medications (rash/itching, nausea, etc)  °3. Fever over 101.5 F (38.5 C) °4. Inability to urinate °5. Nausea and/or vomiting °6. Worsening swelling or bruising °7. Continued bleeding from incision. °8. Increased pain, redness, or drainage from the incision ° ° The clinic staff is available to answer your questions during regular business hours (8:30am-5pm).  Please don’t hesitate to call and ask to speak to one of our nurses for clinical concerns.  ° If you have a medical emergency, go to the nearest emergency room or call 911. ° A surgeon from [facility] is always on call at the hospitals ° ° °[facility] °[address], [address], [place], [place]  [address] ? °MAIN: [phone] ? TOLL FREE: [phone] ?  °FAX [phone] °[email or web address] ° °GETTING TO GOOD BOWEL HEALTH. °Irregular bowel habits such as constipation and diarrhea can lead to many problems over time.  Having one soft bowel movement a day is the most important way to prevent further problems.  The anorectal canal is designed to handle  stretching and feces to safely manage our ability to get rid of solid waste (feces, poop, stool) out of our body.  BUT, hard constipated stools can act like ripping concrete bricks and diarrhea can be a burning fire to this very sensitive area of our body, causing inflamed hemorrhoids, anal fissures, increasing risk is perirectal abscesses, abdominal pain/bloating, an making irritable bowel worse.     ° °The goal: ONE SOFT BOWEL MOVEMENT A DAY!  To have soft, regular bowel movements:  °• Drink plenty of fluids, consider 4-6 tall glasses of water a day.   °• Take plenty of fiber.  Fiber is the undigested part of plant food that passes into the colon, acting s “natures broom” to encourage bowel motility and movement.  Fiber can absorb and hold large amounts of water. This results in a larger, bulkier stool, which is soft and easier to pass. Work gradually over several weeks up to 6 servings a day of fiber (25g a day even more if needed) in the form of: °o Vegetables -- Root (potatoes, carrots, turnips), leafy green (lettuce, salad greens,   celery, spinach), or cooked high residue (cabbage, broccoli, etc) °o Fruit -- Fresh (unpeeled skin & pulp), Dried (prunes, apricots, cherries, etc ),  or stewed ( applesauce)  °o Whole grain breads, pasta, etc (whole wheat)  °o Bran cereals  °• Bulking Agents -- This type of water-retaining fiber generally is easily obtained each day by one of the following:  °o Psyllium bran -- The psyllium plant is remarkable because its ground seeds can retain so much water. This product is available as Metamucil, Konsyl, Effersyllium, Per Diem Fiber, or the less expensive generic preparation in drug and health food stores. Although labeled a laxative, it really is not a laxative.  °o Methylcellulose -- This is another fiber derived from wood which also retains water. It is available as Citrucel. °o Polyethylene Glycol - and “artificial” fiber commonly called Miralax or Glycolax.  It is helpful  for people with gassy or bloated feelings with regular fiber °o Flax Seed - a less gassy fiber than psyllium °• No reading or other relaxing activity while on the toilet. If bowel movements take longer than 5 minutes, you are too constipated °• AVOID CONSTIPATION.  High fiber and water intake usually takes care of this.  Sometimes a laxative is needed to stimulate more frequent bowel movements, but  °• Laxatives are not a good long-term solution as it can wear the colon out.  They can help jump-start bowels if constipated, but should be relied on constantly without discussing with your doctor °o Osmotics (Milk of Magnesia, Fleets phosphosoda, Magnesium citrate, MiraLax, GoLytely) are safer than  °o Stimulants (Senokot, Castor Oil, Dulcolax, Ex Lax)    °o Avoid taking laxatives for more than 7 days in a row. °•  IF SEVERELY CONSTIPATED, try a Bowel Retraining Program: °o Do not use laxatives.  °o Eat a diet high in roughage, such as bran cereals and leafy vegetables.  °o Drink six (6) ounces of prune or apricot juice each morning.  °o Eat two (2) large servings of stewed fruit each day.  °o Take one (1) heaping tablespoon of a psyllium-based bulking agent twice a day. Use sugar-free sweetener when possible to avoid excessive calories.  °o Eat a normal breakfast.  °o Set aside 15 minutes after breakfast to sit on the toilet, but do not strain to have a bowel movement.  °o If you do not have a bowel movement by the third day, use an enema and repeat the above steps.  °• Controlling diarrhea °o Switch to liquids and simpler foods for a few days to avoid stressing your intestines further. °o Avoid dairy products (especially milk & ice cream) for a short time.  The intestines often can lose the ability to digest lactose when stressed. °o Avoid foods that cause gassiness or bloating.  Typical foods include beans and other legumes, cabbage, broccoli, and dairy foods.  Every person has some sensitivity to other foods, so  listen to our body and avoid those foods that trigger problems for you. °o Adding fiber (Citrucel, Metamucil, psyllium, Miralax) gradually can help thicken stools by absorbing excess fluid and retrain the intestines to act more normally.  Slowly increase the dose over a few weeks.  Too much fiber too soon can backfire and cause cramping & bloating. °o Probiotics (such as active yogurt, Align, etc) may help repopulate the intestines and colon with normal bacteria and calm down a sensitive digestive tract.  Most studies show it to be of mild help, though, and such products   can be costly. °o Medicines: °- Bismuth subsalicylate (ex. Kayopectate, Pepto Bismol) every 30 minutes for up to 6 doses can help control diarrhea.  Avoid if pregnant. °- Loperamide (Immodium) can slow down diarrhea.  Start with two tablets (4mg total) first and then try one tablet every 6 hours.  Avoid if you are having fevers or severe pain.  If you are not better or start feeling worse, stop all medicines and call your doctor for advice °o Call your doctor if you are getting worse or not better.  Sometimes further testing (cultures, endoscopy, X-ray studies, bloodwork, etc) may be needed to help diagnose and treat the cause of the diarrhea. ° °TROUBLESHOOTING IRREGULAR BOWELS °1) Avoid extremes of bowel movements (no bad constipation/diarrhea) °2) Miralax 17gm mixed in 8oz. water or juice-daily. May use BID as needed.  °3) Gas-x,Phazyme, etc. as needed for gas & bloating.  °4) Soft,bland diet. No spicy,greasy,fried foods.  °5) Prilosec over-the-counter as needed  °6) May hold gluten/wheat products from diet to see if symptoms improve.  °7)  May try probiotics (Align, Activa, etc) to help calm the bowels down °7) If symptoms become worse call back immediately. ° °Managing Pain ° °Pain after surgery or related to activity is often due to strain/injury to muscle, tendon, nerves and/or incisions.  This pain is usually short-term and will improve in a  few months.  ° °Many people find it helpful to do the following things TOGETHER to help speed the process of healing and to get back to regular activity more quickly: ° °1. Avoid heavy physical activity at first °a. No lifting greater than 20 pounds at first, then increase to lifting as tolerated over the next few weeks °b. Do not “push through” the pain.  Listen to your body and avoid positions and maneuvers than reproduce the pain.  Wait a few days before trying something more intense °c. Walking is okay as tolerated, but go slowly and stop when getting sore.  If you can walk 30 minutes without stopping or pain, you can try more intense activity (running, jogging, aerobics, cycling, swimming, treadmill, sex, sports, weightlifting, etc ) °d. Remember: If it hurts to do it, then don’t do it! ° °2. Take Anti-inflammatory medication °a. Choose ONE of the following over-the-counter medications: °i.            Acetaminophen 500mg tabs (Tylenol) 1-2 pills with every meal and just before bedtime (avoid if you have liver problems) °ii.            Naproxen 220mg tabs (ex. Aleve) 1-2 pills twice a day (avoid if you have kidney, stomach, IBD, or bleeding problems) °iii. Ibuprofen 200mg tabs (ex. Advil, Motrin) 3-4 pills with every meal and just before bedtime (avoid if you have kidney, stomach, IBD, or bleeding problems) °b. Take with food/snack around the clock for 1-2 weeks °i. This helps the muscle and nerve tissues become less irritable and calm down faster ° °3. Use a Heating pad or Ice/Cold Pack °a. 4-6 times a day °b. May use warm bath/hottub  or showers ° °4. Try Gentle Massage and/or Stretching  °a. at the area of pain many times a day °b. stop if you feel pain - do not overdo it ° °Try these steps together to help you body heal faster and avoid making things get worse.  Doing just one of these things may not be enough.   ° °If you are not getting better after two weeks or are noticing   you are getting worse, contact  our office for further advice; we may need to re-evaluate you & see what other things we can do to help. ° °

## 2015-04-15 NOTE — Anesthesia Preprocedure Evaluation (Addendum)
Anesthesia Evaluation  Patient identified by MRN, date of birth, ID band Patient awake    Reviewed: Allergy & Precautions, NPO status , Patient's Chart, lab work & pertinent test results  Airway Mallampati: II       Dental  (+) Upper Dentures   Pulmonary former smoker,  breath sounds clear to auscultation        Cardiovascular hypertension, Pt. on medications negative cardio ROS  Rhythm:Regular Rate:Normal     Neuro/Psych negative neurological ROS  negative psych ROS   GI/Hepatic negative GI ROS, Neg liver ROS,   Endo/Other  negative endocrine ROS  Renal/GU   negative genitourinary   Musculoskeletal negative musculoskeletal ROS (+)   Abdominal   Peds negative pediatric ROS (+)  Hematology negative hematology ROS (+)   Anesthesia Other Findings   Reproductive/Obstetrics negative OB ROS                            Lab Results  Component Value Date   WBC 6.2 04/15/2015   HGB 12.3 04/15/2015   HCT 36.2 04/15/2015   MCV 91.6 04/15/2015   PLT 243 04/15/2015   Lab Results  Component Value Date   CREATININE 0.58 04/15/2015   BUN 7 04/15/2015   NA 138 04/15/2015   K 3.5 04/15/2015   CL 104 04/15/2015   CO2 26 04/15/2015   No results found for: INR, PROTIME  EKG: sinus bradycardia, frequent PVC's noted.   Anesthesia Physical Anesthesia Plan  ASA: II  Anesthesia Plan: General   Post-op Pain Management:    Induction: Intravenous  Airway Management Planned: Oral ETT  Additional Equipment:   Intra-op Plan:   Post-operative Plan: Extubation in OR  Informed Consent: I have reviewed the patients History and Physical, chart, labs and discussed the procedure including the risks, benefits and alternatives for the proposed anesthesia with the patient or authorized representative who has indicated his/her understanding and acceptance.   Dental advisory given  Plan Discussed  with: CRNA  Anesthesia Plan Comments:         Anesthesia Quick Evaluation

## 2015-04-15 NOTE — Transfer of Care (Signed)
Immediate Anesthesia Transfer of Care Note  Patient: [name]  Procedure(s) Performed: Procedure(s): LAPAROSCOPY DIAGNOSTIC ileocalpexy  (N/A)  Patient Location: PACU  Anesthesia Type:General  Level of Consciousness: awake, alert , oriented and patient cooperative  Airway & Oxygen Therapy: Patient Spontanous Breathing and Patient connected to face mask oxygen  Post-op Assessment: Report given to RN, Post -op Vital signs reviewed and stable and Patient moving all extremities  Post vital signs: Reviewed and stable  Last Vitals:  Filed Vitals:   04/15/15 1504  BP: 136/79  Pulse: 79  Temp: 37.1 C  Resp: 16    Complications: No apparent anesthesia complications

## 2015-04-15 NOTE — Anesthesia Procedure Notes (Signed)
Procedure Name: Intubation Date/Time: 04/15/2015 5:01 PM Performed by: Early Osmond E Pre-anesthesia Checklist: Patient identified, Emergency Drugs available, Suction available and Patient being monitored Patient Re-evaluated:Patient Re-evaluated prior to inductionOxygen Delivery Method: Circle System Utilized Preoxygenation: Pre-oxygenation with 100% oxygen Intubation Type: IV induction Ventilation: Mask ventilation without difficulty Laryngoscope Size: Miller and 2 Grade View: Grade I Tube type: Oral Tube size: 7.0 mm Number of attempts: 1 Placement Confirmation: ETT inserted through vocal cords under direct vision,  positive ETCO2 and breath sounds checked- equal and bilateral Secured at: 21 cm Tube secured with: Tape Dental Injury: Teeth and Oropharynx as per pre-operative assessment

## 2015-04-15 NOTE — Progress Notes (Signed)
Patient ID: [name], female   DOB: 1945/09/05, 69 y.o.   MRN: [id number]     [address] [place], [place] [address]    Phone: [phone] FAX: [phone]     Subjective: No n/v.  Last bm on Tuesday, no flatus.  Labs are stable with a normal white count.  Complains of abdominal pain, but more bothersome is the mid back pain.  No dysuria.  No history of chronic back pain.  No fevers.  VSS.    Objective:  Vital signs:  Filed Vitals:   04/14/15 1823 04/14/15 1924 04/15/15 0105 04/15/15 0505  BP: 146/83 134/80 134/81 138/75  Pulse: 80 79 77 76  Temp: 98.1 F (36.7 C) 98.7 F (37.1 C) 98.9 F (37.2 C) 98.6 F (37 C)  TempSrc: Oral Oral Oral Oral  Resp: 16 16 16 16   Height:   5' 2"  (1.575 m)   Weight:   46.7 kg (102 lb 15.3 oz)   SpO2: 95% 96% 97% 97%    Last BM Date: 04/13/15  Intake/Output   Yesterday:  09/01 0701 - 09/02 0700 In: 438.3 [I.V.:438.3] Out: -  This shift:   Physical Exam: General: Pt awake/alert/oriented x4 in no acute distress  Abdomen: Soft.  Nondistended.  +BS.  gengeralized ttp.   No evidence of peritonitis.  No incarcerated hernias.    Problem List:   Active Problems:   Epigastric abdominal pain    Results:   Labs: Results for orders placed or performed during the hospital encounter of 04/14/15 (from the past 48 hour(s))  Urinalysis, Routine w reflex microscopic (not at [facility])     Status: Abnormal   Collection Time: 04/14/15  1:45 PM  Result Value Ref Range   Color, Urine YELLOW YELLOW   APPearance CLEAR CLEAR   Specific Gravity, Urine 1.004 (L) 1.005 - 1.030   pH 6.5 5.0 - 8.0   Glucose, UA NEGATIVE NEGATIVE mg/dL   Hgb urine dipstick NEGATIVE NEGATIVE   Bilirubin Urine NEGATIVE NEGATIVE   Ketones, ur NEGATIVE NEGATIVE mg/dL   Protein, ur NEGATIVE NEGATIVE mg/dL   Urobilinogen, UA 0.2 0.0 - 1.0 mg/dL   Nitrite NEGATIVE NEGATIVE   Leukocytes, UA TRACE  (A) NEGATIVE  Urine microscopic-add on     Status: None   Collection Time: 04/14/15  1:45 PM  Result Value Ref Range   Squamous Epithelial / LPF RARE RARE   WBC, UA 0-2 <3 WBC/hpf   RBC / HPF 0-2 <3 RBC/hpf   Bacteria, UA RARE RARE  CBC with Differential     Status: None   Collection Time: 04/14/15  1:50 PM  Result Value Ref Range   WBC 7.8 4.0 - 10.5 K/uL   RBC 4.50 3.87 - 5.11 MIL/uL   Hemoglobin 13.9 12.0 - 15.0 g/dL   HCT 40.7 36.0 - 46.0 %   MCV 90.4 78.0 - 100.0 fL   MCH 30.9 26.0 - 34.0 pg   MCHC 34.2 30.0 - 36.0 g/dL   RDW 11.9 11.5 - 15.5 %   Platelets 291 150 - 400 K/uL   Neutrophils Relative % 72 43 - 77 %   Neutro Abs 5.6 1.7 - 7.7 K/uL   Lymphocytes Relative 18 12 - 46 %   Lymphs Abs 1.4 0.7 - 4.0 K/uL   Monocytes Relative 9 3 - 12 %   Monocytes Absolute 0.7 0.1 - 1.0 K/uL   Eosinophils Relative  1 0 - 5 %   Eosinophils Absolute 0.1 0.0 - 0.7 K/uL   Basophils Relative 0 0 - 1 %   Basophils Absolute 0.0 0.0 - 0.1 K/uL  Comprehensive metabolic panel     Status: Abnormal   Collection Time: 04/14/15  1:50 PM  Result Value Ref Range   Sodium 138 135 - 145 mmol/L   Potassium 3.5 3.5 - 5.1 mmol/L   Chloride 99 (L) 101 - 111 mmol/L   CO2 28 22 - 32 mmol/L   Glucose, Bld 107 (H) 65 - 99 mg/dL   BUN 8 6 - 20 mg/dL   Creatinine, Ser 0.68 0.44 - 1.00 mg/dL   Calcium 9.4 8.9 - 10.3 mg/dL   Total Protein 7.3 6.5 - 8.1 g/dL   Albumin 4.4 3.5 - 5.0 g/dL   AST 25 15 - 41 U/L   ALT 21 14 - 54 U/L   Alkaline Phosphatase 66 38 - 126 U/L   Total Bilirubin 0.7 0.3 - 1.2 mg/dL   GFR calc non Af Amer >60 >60 mL/min   GFR calc Af Amer >60 >60 mL/min    Comment: (NOTE) The eGFR has been calculated using the CKD EPI equation. This calculation has not been validated in all clinical situations. eGFR's persistently <60 mL/min signify possible Chronic Kidney Disease.    Anion gap 11 5 - 15  Lipase, blood     Status: Abnormal   Collection Time: 04/14/15  1:50 PM  Result Value  Ref Range   Lipase 19 (L) 22 - 51 U/L  I-Stat CG4 Lactic Acid, ED     Status: Abnormal   Collection Time: 04/14/15  5:22 PM  Result Value Ref Range   Lactic Acid, Venous 0.48 (L) 0.5 - 2.0 mmol/L  I-Stat CG4 Lactic Acid, ED     Status: Abnormal   Collection Time: 04/14/15  8:08 PM  Result Value Ref Range   Lactic Acid, Venous 0.47 (L) 0.5 - 2.0 mmol/L  Basic metabolic panel     Status: Abnormal   Collection Time: 04/15/15  5:45 AM  Result Value Ref Range   Sodium 138 135 - 145 mmol/L   Potassium 3.5 3.5 - 5.1 mmol/L   Chloride 104 101 - 111 mmol/L   CO2 26 22 - 32 mmol/L   Glucose, Bld 123 (H) 65 - 99 mg/dL   BUN 7 6 - 20 mg/dL   Creatinine, Ser 0.58 0.44 - 1.00 mg/dL   Calcium 8.7 (L) 8.9 - 10.3 mg/dL   GFR calc non Af Amer >60 >60 mL/min   GFR calc Af Amer >60 >60 mL/min    Comment: (NOTE) The eGFR has been calculated using the CKD EPI equation. This calculation has not been validated in all clinical situations. eGFR's persistently <60 mL/min signify possible Chronic Kidney Disease.    Anion gap 8 5 - 15  CBC     Status: None   Collection Time: 04/15/15  5:45 AM  Result Value Ref Range   WBC 6.2 4.0 - 10.5 K/uL   RBC 3.95 3.87 - 5.11 MIL/uL   Hemoglobin 12.3 12.0 - 15.0 g/dL   HCT 36.2 36.0 - 46.0 %   MCV 91.6 78.0 - 100.0 fL   MCH 31.1 26.0 - 34.0 pg   MCHC 34.0 30.0 - 36.0 g/dL   RDW 12.2 11.5 - 15.5 %   Platelets 243 150 - 400 K/uL    Imaging / Studies: Ct Abdomen Pelvis W Contrast  04/14/2015  CLINICAL DATA:  Upper abdominal pain for 2 days with nausea and vomiting.  EXAM: CT ABDOMEN AND PELVIS WITH CONTRAST  TECHNIQUE: Multidetector CT imaging of the abdomen and pelvis was performed using the standard protocol following bolus administration of intravenous contrast.  CONTRAST:  57m OMNIPAQUE IOHEXOL 300 MG/ML SOLN, 1018mOMNIPAQUE IOHEXOL 300 MG/ML SOLN  COMPARISON:  01/16/2015  FINDINGS: Lower chest and abdominal wall: No acute findings in the lower chest. Right  coronary atherosclerotic calcification. No abdominal wall hernia fluid collection.  Hepatobiliary: Few small presumed liver cysts.Cholecystectomy with mild intra and extrahepatic duct enlargement with no calcified choledocholithiasis. Given liver function tests this is likely reservoir effect.  Pancreas: Unremarkable.  Spleen: Unremarkable.  Adrenals/Urinary Tract: Negative adrenals. Numerous tiny cortical cysts. There is no chart indication of lithium use which can be associated with this pattern. No hydronephrosis or stone. Unremarkable bladder.  Reproductive:Hysterectomy with symmetric ovaries.  Stomach/Bowel: There is twisting of the small and proximal colonic mesentery with ileocecal valve in the left upper quadrant. Similar orientation was likely present previously as the appendix was likely seen in the left upper quadrant. There is no bowel obstruction, ischemic change, or congestion. Large volume of colonic gas and stool. No appendicitis.  Vascular/Lymphatic: Diffuse atherosclerotic calcification. No acute finding No mass or adenopathy.  Peritoneal: Small pelvic fluid, presumably reactive.  Musculoskeletal: No acute abnormalities.  IMPRESSION: 1. 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 No associated bowel obstruction or vascular compromise. 2. Large volume colonic gas and stool.   Electronically Signed   By: JoMonte Fantasia.D.   On: 04/14/2015 16:51    Medications / Allergies:  Scheduled Meds: . bisacodyl  10 mg Rectal BID  . enoxaparin  40 mg Subcutaneous Q24H  . pantoprazole (PROTONIX) IV  40 mg Intravenous QHS   Continuous Infusions: . dextrose 5 % and 0.9 % NaCl with KCl 20 mEq/L 100 mL/hr at 04/15/15 0137   PRN Meds:.acetaminophen **OR** acetaminophen, fentaNYL (SUBLIMAZE) injection, ondansetron **OR** ondansetron (ZOFRAN) IV  Antibiotics: Anti-infectives    None        Assessment/Plan Epigastric abdominal  pain-remains tender on exam, abdomen is soft and has bowel sounds.  No indications for urgent surgery, but given ongoing symptoms and CT concerning for small bowel and proximal colon "twisting" she likely needs a diagnostic laparoscopy.  Will keep her NPO and discuss with attending more definitive plans.   FEN-IVF, add IV pain meds VTE prophylaxis-SCD, lovenox given at 0606.   EmErby PianANCura[facility] Pager [phone](7A-4:30P)   04/15/2015 10:28 AM

## 2015-04-15 NOTE — Op Note (Addendum)
04/15/2015  6:26 PM  PATIENT:  [name]  69 y.o. female  Patient Care Team: Reeves Dam, MD as PCP - General (Internal Medicine)  PRE-OPERATIVE DIAGNOSIS:   Abdominal pain.  Malrotation of bowel  POST-OPERATIVE DIAGNOSIS:    Abdominal pain.  Ileocecal bascule with some malrotation   PROCEDURE:   LAPAROSCOPY DIAGNOSTIC Laparoscopic ileocecal pexy   SURGEON:  Surgeon(s): Michael Boston, MD  ASSISTANT: Rn   ANESTHESIA:   local  EBL:  Total I/O In: 1800 [I.V.:1800] Out: 125 [Urine:75; Blood:50]  Delay start of Pharmacological VTE agent (>24hrs) due to surgical blood loss or risk of bleeding:  no  DRAINS: none   SPECIMEN:  No Specimen  DISPOSITION OF SPECIMEN:  n/a  COUNTS:  YES  PLAN OF CARE: Admit for overnight observation  PATIENT DISPOSITION:  PACU - hemodynamically stable.  INDICATION:   Patient with evidence of worsening abdominal pain.  CT scan concerning for swirling of mesentery and possible malrotation.  Differential diagnosis did not seen is likely.  Because of lack of improvement and worsening pain,.  I recommended surgery:  The anatomy & physiology of the digestive tract was discussed.  The pathophysiology of perforation was discussed.  Differential diagnosis such as perforated ulcer or colon, etc was discussed.   Natural history risks without surgery such as death was discussed.  I recommended abdominal exploration to diagnose & treat the source of the problem.  Laparoscopic & open techniques were discussed.   Risks such as bleeding, infection, abscess, leak, reoperation, bowel resection, possible ostomy, hernia, heart attack, death, and other risks were discussed.   The risks of no intervention will lead to serious problems including death.   I expressed a good likelihood that surgery will address the problem.    Goals of post-operative recovery were discussed as well.  We will work to minimize complications although risks in an emergent  setting are high.   Questions were answered.  The patient expressed understanding & wishes to proceed with surgery.      OR FINDINGS: Patient had a long cecal bascule with ileocecal twisting of the stretched out mesentery.  Not a definite transition point.  No intra-abdominal adhesions of significance.  Duodenum in normal location, not c/w malrotation.  No evidence of appendicitis.  No Meckel's diverticulum.  No diverticulitis.  No ulcer perforation.  No evidence of hiatal hernia.  No discomfort liver abnormalities.  Bowel adhesions from cholecystectomy along right liver edge c/w prior lap chole.Marland Kitchen  No pelvic abnormalities.  Mildly atrophic bilateral adnexa and hydrosalpinx.  No retroperitoneal mass or inflammation.  Ileocecal pexy done with absorbable suture  DESCRIPTION:   Informed consent was confirmed.  The patient underwent general anaesthesia without difficulty.  The patient was positioned appropriately.  VTE prevention in place.  The patient's abdomen was clipped, prepped, & draped in a sterile fashion.  Surgical timeout confirmed our plan.  The patient was positioned in reverse Trendelenburg.  Abdominal entry with a 25m port was gained using optical entry technique in the left upper abdomen.  Entry was clean.  I induced carbon dioxide insufflation.  Camera inspection revealed no injury.  Extra ports were carefully placed under direct laparoscopic visualization.  There is no evidence of peritonitis.  I cannot locate ileocecal valve.  I located the hepatic flexure.  I came back proximally and untwisted a large cecal bascule with ascending colon cecum and terminal ileum twisted underneath itself.  There is no evidence of any ischemia strangulation.  No major  transition point.  Appendix looked completely normal.  I ran the small bowel proximally to the ligament of Treitz.  No interloop adhesions.  No perforation or ischemia.  No diverticula.  I ran the colon from the ileocecal valve to the  peritoneal reflection saw no abnormalities.  No hiatal hernia.  Liver had mild adhesions from cholecystectomy to the mesocolon as expected.  No adhesions to the abdominal wall.  No evidence of fit sutured [place].  No evidence of hiatal hernia on the left upper quadrant.  Spleen normal.  Did not sense any particular swelling of the retroperitoneum.  No evidence of free air perforation.  Because of some twisting of the cecal bascule with some crampy abdominal pain and nausea vomiting, decided to proceed with ileocecal pexy.  Did not want to perform a bowel resection and him prepped bowel unless there was some stronger evidence of ischemia or obstruction.  I pexied from the appendiceal mesentery and folds of trees on the terminal ileum and ran that towards the midline retroperitoneum with a 20 vicryl to help pexy the ileal mesentery and appendix mesentery down.  I then ran a 20 vicryl suture from the distal ascending colon proximally down towards the cecum and met that area.  This helped 3 tack the ascending colon, cecum, appendix, terminal ileum to the right anterolateral side wall.  This prevented anymore twisting or turning.  I again ran the small bowel and saw no other abnormalities.  There were no internal defects.  The mesentery was stretched out was well splayed with otherwise normal anatomy.  Not consistent with classic malrotation.  Did some mild irrigation with clear return.  Inspected the rest abdominal cavity as noted above.  We then evacuated carbon dioxide to remove the ports.  I closed the ports using Monocryl stitch a sterile dressings.  I am about to locate family to discuss intraoperative findings and postoperative care.  Adin Hector, M.D., F.A.C.S. Gastrointestinal and Minimally Invasive Surgery [facility] [address], [place], [place] [address] [phone] Main / Paging

## 2015-04-16 LAB — CREATININE, SERUM: Creatinine, Ser: 0.54 mg/dL (ref 0.44–1.00)

## 2015-04-16 MED ORDER — ENOXAPARIN SODIUM 40 MG/0.4ML ~~LOC~~ SOLN
40.0000 mg | SUBCUTANEOUS | Status: DC
  Administered 2015-04-16: 40 mg via SUBCUTANEOUS
  Filled 2015-04-16 (×4): qty 0.4

## 2015-04-16 NOTE — Anesthesia Postprocedure Evaluation (Signed)
  Anesthesia Post-op Note  Patient: [name]  Procedure(s) Performed: Procedure(s): LAPAROSCOPY DIAGNOSTIC ileocalpexy  (N/A)  Patient Location: PACU  Anesthesia Type:General  Level of Consciousness: awake, alert  and oriented  Airway and Oxygen Therapy: Patient Spontanous Breathing and Patient connected to nasal cannula oxygen  Post-op Pain: mild  Post-op Assessment: Post-op Vital signs reviewed and Patient's Cardiovascular Status Stable              Post-op Vital Signs: Reviewed and stable  Last Vitals:  Filed Vitals:   04/16/15 0700  BP: 138/65  Pulse: 90  Temp: 36.4 C  Resp: 18    Complications: No apparent anesthesia complications

## 2015-04-16 NOTE — Plan of Care (Signed)
Problem: Phase I Progression Outcomes Goal: Pain controlled with appropriate interventions Outcome: Progressing Pt reporting pain in RLQ today described as "jabbing like a knife sticking me."  Said it flares suddenly then calms, but "the pain medicine only helps a little, then it stabs again."  Said she told MD this a.m. Who told her it might be gas.  Pt ambulating and sitting up frequently.  Will continue to monitor.

## 2015-04-16 NOTE — Progress Notes (Signed)
Patient ID: [name], female   DOB: 06-20-1946, 69 y.o.   MRN: [id number] 1 Day Post-Op  Subjective: Had been doing okay but is having some sharp lower abdominal pain this morning. Nausea resolved. No other complaints. He has not yet had pain medication.  Objective: Vital signs in last 24 hours: Temp:  [97.4 F (36.3 C)-98.8 F (37.1 C)] 97.6 F (36.4 C) (09/03 0700) Pulse Rate:  [77-113] 90 (09/03 0700) Resp:  [13-21] 18 (09/03 0700) BP: (114-155)/(65-84) 138/65 mmHg (09/03 0700) SpO2:  [96 %-100 %] 97 % (09/03 0700) Last BM Date: 04/13/15  Intake/Output from previous day: 09/02 0701 - 09/03 0700 In: 3474.2 [P.O.:90; I.V.:3304.2; NG/GT:30; IV Piggyback:50] Out: 825 [Urine:475; Emesis/NG output:300; Blood:50] Intake/Output this shift: Total I/O In: -  Out: 300 [Urine:300]  General appearance: alert, cooperative and mild distress GI: moderate lower abdominal tenderness, no distention, no peritoneal signs Incision/Wound: dressings clean and dry  Lab Results:   Recent Labs  04/14/15 1350 04/15/15 0545  WBC 7.8 6.2  HGB 13.9 12.3  HCT 40.7 36.2  PLT 291 243   BMET  Recent Labs  04/14/15 1350 04/15/15 0545 04/16/15 0545  NA 138 138  --   K 3.5 3.5  --   CL 99* 104  --   CO2 28 26  --   GLUCOSE 107* 123*  --   BUN 8 7  --   CREATININE 0.68 0.58 0.54  CALCIUM 9.4 8.7*  --      Studies/Results: Ct Abdomen Pelvis W Contrast  04/14/2015   CLINICAL DATA:  Upper abdominal pain for 2 days with nausea and vomiting.  EXAM: CT ABDOMEN AND PELVIS WITH CONTRAST  TECHNIQUE: Multidetector CT imaging of the abdomen and pelvis was performed using the standard protocol following bolus administration of intravenous contrast.  CONTRAST:  50mL OMNIPAQUE IOHEXOL 300 MG/ML SOLN, OMNIPAQUE IOHEXOL 300 MG/ML SOLN  COMPARISON:  01/16/2015  FINDINGS: Lower chest and abdominal wall: No acute findings in the lower chest. Right coronary atherosclerotic calcification. No  abdominal wall hernia fluid collection.  Hepatobiliary: Few small presumed liver cysts.Cholecystectomy with mild intra and extrahepatic duct enlargement with no calcified choledocholithiasis. Given liver function tests this is likely reservoir effect.  Pancreas: Unremarkable.  Spleen: Unremarkable.  Adrenals/Urinary Tract: Negative adrenals. Numerous tiny cortical cysts. There is no chart indication of lithium use which can be associated with this pattern. No hydronephrosis or stone. Unremarkable bladder.  Reproductive:Hysterectomy with symmetric ovaries.  Stomach/Bowel: There is twisting of the small and proximal colonic mesentery with ileocecal valve in the left upper quadrant. Similar orientation was likely present previously as the appendix was likely seen in the left upper quadrant. There is no bowel obstruction, ischemic change, or congestion. Large volume of colonic gas and stool. No appendicitis.  Vascular/Lymphatic: Diffuse atherosclerotic calcification. No acute finding No mass or adenopathy.  Peritoneal: Small pelvic fluid, presumably reactive.  Musculoskeletal: No acute abnormalities.  IMPRESSION: 1. 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 No associated bowel obstruction or vascular compromise. 2. Large volume colonic gas and stool.   Electronically Signed   By: Marnee Spring M.D.   On: 04/14/2015 16:51    Anti-infectives: Anti-infectives    Start     Dose/Rate Route Frequency Ordered Stop   04/15/15 1500  clindamycin (CLEOCIN) IVPB 900 mg     900 mg 100 mL/hr over 30 Minutes Intravenous On call to O.R. 04/15/15 1457 04/15/15 1740  04/15/15 1500  aztreonam (AZACTAM) 2 g in dextrose 5 % 50 mL IVPB     2 g 100 mL/hr over 30 Minutes Intravenous On call to O.R. 04/15/15 1457 04/15/15 1711   04/15/15 1430  cefoTEtan (CEFOTAN) 2 g in dextrose 5 % 50 mL IVPB  Status:  Discontinued    Comments:  Pharmacy may adjust dose strength for  optimal dosing.   Send with patient on call to the OR.  Anesthesia to complete antibiotic administration <82min prior to incision per [facility].   2 g 100 mL/hr over 30 Minutes Intravenous On call to O.R. 04/15/15 1427 04/15/15 1451      Assessment/Plan: s/p Procedure(s): LAPAROSCOPY DIAGNOSTIC ileocalpexy  I expect this is routine postoperative pain or possibly some cramping. We will treat pain and monitor. Abdomen seems benign. Discontinue NG tube and leave nothing by mouth except ice chips for now   LOS: 1 day    Diva Lemberger T 04/16/2015

## 2015-04-17 LAB — CBC
HCT: 33.6 % — ABNORMAL LOW (ref 36.0–46.0)
Hemoglobin: 11.2 g/dL — ABNORMAL LOW (ref 12.0–15.0)
MCH: 31.1 pg (ref 26.0–34.0)
MCHC: 33.3 g/dL (ref 30.0–36.0)
MCV: 93.3 fL (ref 78.0–100.0)
PLATELETS: 228 10*3/uL (ref 150–400)
RBC: 3.6 MIL/uL — ABNORMAL LOW (ref 3.87–5.11)
RDW: 12.4 % (ref 11.5–15.5)
WBC: 7.4 10*3/uL (ref 4.0–10.5)

## 2015-04-17 LAB — CREATININE, SERUM
CREATININE: 0.65 mg/dL (ref 0.44–1.00)
GFR calc Af Amer: >60 mL/min (ref >60)
GFR calc non Af Amer: >60 mL/min (ref >60)

## 2015-04-17 MED ORDER — HYDROCODONE-ACETAMINOPHEN 5-325 MG PO TABS
1.0000 | ORAL_TABLET | ORAL | Status: DC | PRN
  Administered 2015-04-18: 1 via ORAL
  Filled 2015-04-17: qty 1

## 2015-04-17 NOTE — Progress Notes (Signed)
Patient ID: [name], female   DOB: Sep 07, 1945, 69 y.o.   MRN: [id number] 2 Days Post-Op  Subjective: Feels dramatically better today. Hungry. Passing flatus. Pain is much improved with now mild right lower quadrant discomfort.  Objective: Vital signs in last 24 hours: Temp:  [99 F (37.2 C)-100.1 F (37.8 C)] 99 F (37.2 C) (09/04 0616) Pulse Rate:  [77-89] 77 (09/04 0616) Resp:  [18] 18 (09/04 0616) BP: (134-149)/(66-91) 138/66 mmHg (09/04 0616) SpO2:  [96 %-98 %] 97 % (09/04 0616) Last BM Date: 04/15/15  Intake/Output from previous day: 09/03 0701 - 09/04 0700 In: 2460 [P.O.:60; I.V.:2400] Out: 2550 [Urine:2500; Emesis/NG output:50] Intake/Output this shift: Total I/O In: -  Out: 1000 [Urine:1000]  General appearance: alert, cooperative and no distress GI: normal findings: soft, non-tender and mild distention Incision/Wound: clean and dry  Lab Results:   Recent Labs  04/15/15 0545 04/17/15 0530  WBC 6.2 7.4  HGB 12.3 11.2*  HCT 36.2 33.6*  PLT 243 228   BMET  Recent Labs  04/14/15 1350 04/15/15 0545 04/16/15 0545 04/17/15 0530  NA 138 138  --   --   K 3.5 3.5  --   --   CL 99* 104  --   --   CO2 28 26  --   --   GLUCOSE 107* 123*  --   --   BUN 8 7  --   --   CREATININE 0.68 0.58 0.54 0.65  CALCIUM 9.4 8.7*  --   --      Studies/Results: No results found.  Anti-infectives: Anti-infectives    Start     Dose/Rate Route Frequency Ordered Stop   04/15/15 1500  clindamycin (CLEOCIN) IVPB 900 mg     900 mg 100 mL/hr over 30 Minutes Intravenous On call to O.R. 04/15/15 1457 04/15/15 1740   04/15/15 1500  aztreonam (AZACTAM) 2 g in dextrose 5 % 50 mL IVPB     2 g 100 mL/hr over 30 Minutes Intravenous On call to O.R. 04/15/15 1457 04/15/15 1711   04/15/15 1430  cefoTEtan (CEFOTAN) 2 g in dextrose 5 % 50 mL IVPB  Status:  Discontinued    Comments:  Pharmacy may adjust dose strength for optimal dosing.   Send with patient on call to the OR.   Anesthesia to complete antibiotic administration <41min prior to incision per [facility].   2 g 100 mL/hr over 30 Minutes Intravenous On call to O.R. 04/15/15 1427 04/15/15 1451      Assessment/Plan: s/p Procedure(s): LAPAROSCOPY DIAGNOSTIC ileocalpexy  Doing well. Start full liquid diet. Switch to oral pain medication. Anticipate discharge tomorrow.   LOS: 2 days    Arfa Lamarca T 04/17/2015

## 2015-04-17 NOTE — Plan of Care (Signed)
Problem: Phase II Progression Outcomes Goal: Return of bowel function (flatus, BM) IF ABDOMINAL SURGERY:  Outcome: Progressing + flatus     

## 2015-04-18 LAB — CREATININE, SERUM: Creatinine, Ser: 0.7 mg/dL (ref 0.44–1.00)

## 2015-04-18 MED ORDER — OXYCODONE-ACETAMINOPHEN 5-325 MG PO TABS: 1.0000 | ORAL_TABLET | ORAL | Status: DC | PRN

## 2015-04-18 NOTE — Progress Notes (Signed)
Patient ID: [name], female   DOB: Jul 23, 1946, 68 y.o.   MRN: [id number] 3 Days Post-Op  Subjective: Very anxious to go home. No complaints. Tolerating diet and having bowel movements.  Objective: Vital signs in last 24 hours: Temp:  [98.4 F (36.9 C)-98.9 F (37.2 C)] 98.9 F (37.2 C) (09/05 0644) Pulse Rate:  [68-77] 76 (09/05 0644) Resp:  [16-18] 18 (09/05 0644) BP: (137-158)/(72-83) 153/83 mmHg (09/05 0644) SpO2:  [97 %-99 %] 99 % (09/05 0644) Last BM Date: 04/15/15  Intake/Output from previous day: 09/04 0701 - 09/05 0700 In: 1796.7 [P.O.:360; I.V.:1436.7] Out: 4250 [Urine:4250] Intake/Output this shift: Total I/O In: -  Out: 200 [Urine:200]  General appearance: alert, cooperative and no distress GI: normal findings: soft, non-tender and minimally distended Incision/Wound: clean and dry  Lab Results:   Recent Labs  04/17/15 0530  WBC 7.4  HGB 11.2*  HCT 33.6*  PLT 228   BMET  Recent Labs  04/17/15 0530 04/18/15 0514  CREATININE 0.65 0.70     Studies/Results: No results found.  Anti-infectives: Anti-infectives    Start     Dose/Rate Route Frequency Ordered Stop   04/15/15 1500  clindamycin (CLEOCIN) IVPB 900 mg     900 mg 100 mL/hr over 30 Minutes Intravenous On call to O.R. 04/15/15 1457 04/15/15 1740   04/15/15 1500  aztreonam (AZACTAM) 2 g in dextrose 5 % 50 mL IVPB     2 g 100 mL/hr over 30 Minutes Intravenous On call to O.R. 04/15/15 1457 04/15/15 1711   04/15/15 1430  cefoTEtan (CEFOTAN) 2 g in dextrose 5 % 50 mL IVPB  Status:  Discontinued    Comments:  Pharmacy may adjust dose strength for optimal dosing.   Send with patient on call to the OR.  Anesthesia to complete antibiotic administration <46min prior to incision per [facility].   2 g 100 mL/hr over 30 Minutes Intravenous On call to O.R. 04/15/15 1427 04/15/15 1451      Assessment/Plan: s/p Procedure(s): LAPAROSCOPY DIAGNOSTIC ileocalpexy  Doing well without apparent  complication. Okay for discharge.   LOS: 3 days    Diezel Mazur T 04/18/2015

## 2015-04-19 ENCOUNTER — Encounter (HOSPITAL_COMMUNITY): Payer: Self-pay | Admitting: Surgery

## 2015-04-28 NOTE — Discharge Summary (Signed)
Physician Discharge Summary  [name]:[id number] DOB: 1946/03/05 DOA: 04/14/2015  PCP: Agustina Caroli, MD  Consultation: none  Admit date: 04/14/2015 Discharge date: 04/18/2015  Recommendations for Outpatient Follow-up:   Follow-up Information    Follow up with GROSS,STEVEN C., MD. Schedule an appointment as soon as possible for a visit in 3 weeks.   Specialty:  General Surgery   Contact information:   [address] [place] [address] [phone]      Discharge Diagnoses:  1. Abdominal pain   Surgical Procedure: diagnostic laparoscopy with ileocecal pexy---Dr. Michaell Cowing  Discharge Condition: stable Disposition: home  Diet recommendation: regular  Filed Weights   04/14/15 1318 04/15/15 0105  Weight: 46.267 kg (102 lb) 46.7 kg (102 lb 15.3 oz)       Hospital Course:  [name] came from [facility] with abdominal pain and distention with CT suggesting "proximal colon mesentery twisting"  She did not require urgent surgery, but failed to improve.  She underwent the procedure listed above.  She tolerated it well and was transferred to the floor.  She remained stable and was progressed.  On POD#2 she was felt stable for discharge home.     Discharge Instructions  Discharge Instructions    Call MD for:  extreme fatigue    Complete by:  As directed      Call MD for:  hives    Complete by:  As directed      Call MD for:  persistant nausea and vomiting    Complete by:  As directed      Call MD for:  redness, tenderness, or signs of infection (pain, swelling, redness, odor or green/yellow discharge around incision site)    Complete by:  As directed      Call MD for:  severe uncontrolled pain    Complete by:  As directed      Call MD for:    Complete by:  As directed   Temperature > 101.52F     Diet - low sodium heart healthy    Complete by:  As directed      Discharge instructions    Complete by:  As directed   Please see discharge instruction  sheets.  Also refer to handout given an office.  Please call our office if you have any questions or concerns [phone]     Discharge patient    Complete by:  As directed      Discharge wound care:    Complete by:  As directed   If you have closed incisions, shower and bathe over these incisions with soap and water every day.  Remove all surgical dressings on postoperative day #3.  You do not need to replace dressings over the closed incisions unless you feel more comfortable with a Band-Aid covering it.   If you have an open wound that requires packing, please see wound care instructions.  In general, remove all dressings, wash wound with soap and water and then replace with saline moistened gauze.  Do the dressing change at least every day.  Please call our office [phone] if you have further questions.     Driving Restrictions    Complete by:  As directed   No driving until off narcotics and can safely swerve away without pain during an emergency     Increase activity slowly    Complete by:  As directed   Walk an hour a day.  Use 20-30 minute walks.  When you can walk 30 minutes without difficulty, it is fine to restart low impact/moderate activities such as biking, jogging, swimming, sexual activity, etc.  Eventually you can increase to unrestricted activity when not feeling pain.  If you feel pain: STOP!Marland Kitchen   Let pain protect you from overdoing it.  Use ice/heat & over-the-counter pain medications to help minimize soreness.  If that is not enough, then use your narcotic pain prescription as needed to remain active.  It is better to take extra pain medications and be more active than to stay bedridden to avoid all pain medications.     Lifting restrictions    Complete by:  As directed   Avoid heavy lifting initially.  Do not push through pain.  You have no specific weight limit - if it hurts to do, DON'T DO IT.   If you feel no pain, you are not injuring anything.  Pain will protect you  from injury.  Coughing and sneezing are far more stressful to your incision than any lifting.  Avoid resuming heavy lifting / intense activity until off all narcotic pain medications.  When ready to exercise more, give yourself 2 weeks to gradually get back to full intense exercise/activity.     May shower / Bathe    Complete by:  As directed      May walk up steps    Complete by:  As directed      Sexual Activity Restrictions    Complete by:  As directed   Sexual activity as tolerated.  Do not push through pain.  Pain will protect you from injury.     Walk with assistance    Complete by:  As directed   Walk over an hour a day.  May use a walker/cane/companion to help with balance and stamina.            Medication List    TAKE these medications        aspirin 81 MG tablet  Take 81 mg by mouth at bedtime.     Fish Oil 1000 MG Caps  Take 2,000 mg by mouth daily.     hydrochlorothiazide 12.5 MG capsule  Commonly known as:  MICROZIDE  Take 12.5 mg by mouth daily.     oxyCODONE-acetaminophen 5-325 MG per tablet  Commonly known as:  PERCOCET/ROXICET  Take 1 tablet by mouth every 4 (four) hours as needed for moderate pain or severe pain.     pravastatin 20 MG tablet  Commonly known as:  PRAVACHOL  Take 20 mg by mouth.     traMADol 50 MG tablet  Commonly known as:  ULTRAM  Take 1-2 tablets (50-100 mg total) by mouth every 6 (six) hours as needed for moderate pain or severe pain.           Follow-up Information    Follow up with GROSS,STEVEN C., MD. Schedule an appointment as soon as possible for a visit in 3 weeks.   Specialty:  General Surgery   Contact information:   [address] [place] [address] [phone]        The results of significant diagnostics from this hospitalization (including imaging, microbiology, ancillary and laboratory) are listed below for reference.    Significant Diagnostic Studies: Ct Abdomen Pelvis W  Contrast  04/14/2015   CLINICAL DATA:  Upper abdominal pain for 2 days with nausea and vomiting.  EXAM: CT ABDOMEN AND PELVIS WITH CONTRAST  TECHNIQUE: Multidetector CT imaging of the  abdomen and pelvis was performed using the standard protocol following bolus administration of intravenous contrast.  CONTRAST:  50mL OMNIPAQUE IOHEXOL 300 MG/ML SOLN, OMNIPAQUE IOHEXOL 300 MG/ML SOLN  COMPARISON:  01/16/2015  FINDINGS: Lower chest and abdominal wall: No acute findings in the lower chest. Right coronary atherosclerotic calcification. No abdominal wall hernia fluid collection.  Hepatobiliary: Few small presumed liver cysts.Cholecystectomy with mild intra and extrahepatic duct enlargement with no calcified choledocholithiasis. Given liver function tests this is likely reservoir effect.  Pancreas: Unremarkable.  Spleen: Unremarkable.  Adrenals/Urinary Tract: Negative adrenals. Numerous tiny cortical cysts. There is no chart indication of lithium use which can be associated with this pattern. No hydronephrosis or stone. Unremarkable bladder.  Reproductive:Hysterectomy with symmetric ovaries.  Stomach/Bowel: There is twisting of the small and proximal colonic mesentery with ileocecal valve in the left upper quadrant. Similar orientation was likely present previously as the appendix was likely seen in the left upper quadrant. There is no bowel obstruction, ischemic change, or congestion. Large volume of colonic gas and stool. No appendicitis.  Vascular/Lymphatic: Diffuse atherosclerotic calcification. No acute finding No mass or adenopathy.  Peritoneal: Small pelvic fluid, presumably reactive.  Musculoskeletal: No acute abnormalities.  IMPRESSION: 1. 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 No associated bowel obstruction or vascular compromise. 2. Large volume colonic gas and stool.   Electronically Signed   By: Marnee Spring M.D.   On: 04/14/2015 16:51     Microbiology: No results found for this or any previous visit (from the past 240 hour(s)).   Labs: Basic Metabolic Panel: No results for input(s): NA, K, CL, CO2, GLUCOSE, BUN, CREATININE, CALCIUM, MG, PHOS in the last 168 hours. Liver Function Tests: No results for input(s): AST, ALT, ALKPHOS, BILITOT, PROT, ALBUMIN in the last 168 hours. No results for input(s): LIPASE, AMYLASE in the last 168 hours. No results for input(s): AMMONIA in the last 168 hours. CBC: No results for input(s): WBC, NEUTROABS, HGB, HCT, MCV, PLT in the last 168 hours. Cardiac Enzymes: No results for input(s): CKTOTAL, CKMB, CKMBINDEX, TROPONINI in the last 168 hours. BNP: BNP (last 3 results) No results for input(s): BNP in the last 8760 hours.  ProBNP (last 3 results) No results for input(s): PROBNP in the last 8760 hours.  CBG: No results for input(s): GLUCAP in the last 168 hours.  Active Problems:   Epigastric abdominal pain   Time coordinating discharge: <30 mins  Signed:  Sevanna Ballengee, ANP-BC

## 2015-11-26 ENCOUNTER — Encounter (HOSPITAL_BASED_OUTPATIENT_CLINIC_OR_DEPARTMENT_OTHER): Payer: Self-pay | Admitting: Emergency Medicine

## 2015-11-26 ENCOUNTER — Emergency Department (HOSPITAL_BASED_OUTPATIENT_CLINIC_OR_DEPARTMENT_OTHER): Payer: Medicare Other

## 2015-11-26 ENCOUNTER — Inpatient Hospital Stay (HOSPITAL_BASED_OUTPATIENT_CLINIC_OR_DEPARTMENT_OTHER)
Admission: EM | Admit: 2015-11-26 | Discharge: 2015-11-29 | DRG: 481 | Disposition: A | Discharge status: Home Health | Payer: Medicare Other | Attending: Internal Medicine | Admitting: Internal Medicine

## 2015-11-26 DIAGNOSIS — W010XXA Fall on same level from slipping, tripping and stumbling without subsequent striking against object, initial encounter: Secondary | ICD-10-CM | POA: Diagnosis present

## 2015-11-26 DIAGNOSIS — I10 Essential (primary) hypertension: Secondary | ICD-10-CM | POA: Diagnosis present

## 2015-11-26 DIAGNOSIS — Z79899 Other long term (current) drug therapy: Secondary | ICD-10-CM | POA: Diagnosis not present

## 2015-11-26 DIAGNOSIS — Z419 Encounter for procedure for purposes other than remedying health state, unspecified: Secondary | ICD-10-CM

## 2015-11-26 DIAGNOSIS — S72002A Fracture of unspecified part of neck of left femur, initial encounter for closed fracture: Secondary | ICD-10-CM | POA: Diagnosis present

## 2015-11-26 DIAGNOSIS — E7800 Pure hypercholesterolemia, unspecified: Secondary | ICD-10-CM | POA: Diagnosis not present

## 2015-11-26 DIAGNOSIS — S72012A Unspecified intracapsular fracture of left femur, initial encounter for closed fracture: Principal | ICD-10-CM | POA: Diagnosis present

## 2015-11-26 DIAGNOSIS — J9811 Atelectasis: Secondary | ICD-10-CM | POA: Diagnosis not present

## 2015-11-26 DIAGNOSIS — Z7982 Long term (current) use of aspirin: Secondary | ICD-10-CM

## 2015-11-26 DIAGNOSIS — Z87891 Personal history of nicotine dependence: Secondary | ICD-10-CM | POA: Diagnosis not present

## 2015-11-26 DIAGNOSIS — E785 Hyperlipidemia, unspecified: Secondary | ICD-10-CM | POA: Diagnosis present

## 2015-11-26 DIAGNOSIS — Z01818 Encounter for other preprocedural examination: Secondary | ICD-10-CM | POA: Diagnosis not present

## 2015-11-26 DIAGNOSIS — Y92009 Unspecified place in unspecified non-institutional (private) residence as the place of occurrence of the external cause: Secondary | ICD-10-CM | POA: Diagnosis not present

## 2015-11-26 DIAGNOSIS — S72009A Fracture of unspecified part of neck of unspecified femur, initial encounter for closed fracture: Secondary | ICD-10-CM | POA: Diagnosis present

## 2015-11-26 DIAGNOSIS — M25552 Pain in left hip: Secondary | ICD-10-CM | POA: Diagnosis present

## 2015-11-26 HISTORY — DX: Fracture of unspecified part of neck of left femur, initial encounter for closed fracture: S72.002A

## 2015-11-26 LAB — CBC WITH DIFFERENTIAL/PLATELET
BASOS ABS: 0 10*3/uL (ref 0.0–0.1)
BASOS PCT: 0 %
EOS ABS: 0.1 10*3/uL (ref 0.0–0.7)
EOS PCT: 1 %
HCT: 38.5 % (ref 36.0–46.0)
Hemoglobin: 13.3 g/dL (ref 12.0–15.0)
LYMPHS PCT: 11 %
Lymphs Abs: 1.3 10*3/uL (ref 0.7–4.0)
MCH: 31.4 pg (ref 26.0–34.0)
MCHC: 34.5 g/dL (ref 30.0–36.0)
MCV: 90.8 fL (ref 78.0–100.0)
MONO ABS: 0.9 10*3/uL (ref 0.1–1.0)
Monocytes Relative: 7 %
Neutro Abs: 9.6 10*3/uL — ABNORMAL HIGH (ref 1.7–7.7)
Neutrophils Relative %: 81 %
PLATELETS: 211 10*3/uL (ref 150–400)
RBC: 4.24 MIL/uL (ref 3.87–5.11)
RDW: 11.9 % (ref 11.5–15.5)
WBC: 11.9 10*3/uL — AB (ref 4.0–10.5)

## 2015-11-26 LAB — BASIC METABOLIC PANEL
ANION GAP: 9 (ref 5–15)
BUN: 15 mg/dL (ref 6–20)
CALCIUM: 9.1 mg/dL (ref 8.9–10.3)
CO2: 27 mmol/L (ref 22–32)
Chloride: 103 mmol/L (ref 101–111)
Creatinine, Ser: 0.8 mg/dL (ref 0.44–1.00)
Glucose, Bld: 99 mg/dL (ref 65–99)
POTASSIUM: 3.5 mmol/L (ref 3.5–5.1)
SODIUM: 139 mmol/L (ref 135–145)

## 2015-11-26 MED ORDER — FENTANYL CITRATE (PF) 100 MCG/2ML IJ SOLN
25.0000 ug | Freq: Once | INTRAMUSCULAR | Status: AC
  Administered 2015-11-26: 25 ug via INTRAVENOUS
  Filled 2015-11-26: qty 2

## 2015-11-26 NOTE — ED Notes (Signed)
Pt was cleaning out pool and slipped and fell on hard ground, landing on left hip.  Pt reports severe pain in left hip since fall about 3 hours ago.

## 2015-11-26 NOTE — ED Notes (Signed)
MD aware of CBC results

## 2015-11-26 NOTE — Anesthesia Preprocedure Evaluation (Addendum)
Anesthesia Evaluation  Patient identified by MRN, date of birth, ID band Patient awake    Reviewed: Allergy & Precautions, NPO status , Patient's Chart, lab work & pertinent test results  Airway Mallampati: II       Dental  (+) Upper Dentures   Pulmonary former smoker,    breath sounds clear to auscultation       Cardiovascular hypertension, Pt. on medications and On Medications negative cardio ROS   Rhythm:Regular Rate:Normal     Neuro/Psych negative neurological ROS  negative psych ROS   GI/Hepatic negative GI ROS, Neg liver ROS,   Endo/Other  negative endocrine ROS  Renal/GU   negative genitourinary   Musculoskeletal negative musculoskeletal ROS (+)   Abdominal   Peds negative pediatric ROS (+)  Hematology negative hematology ROS (+)   Anesthesia Other Findings   Reproductive/Obstetrics negative OB ROS                            Lab Results  Component Value Date   WBC 11.9* 11/26/2015   HGB 13.3 11/26/2015   HCT 38.5 11/26/2015   MCV 90.8 11/26/2015   PLT 211 11/26/2015   Lab Results  Component Value Date   CREATININE 0.80 11/26/2015   BUN 15 11/26/2015   NA 139 11/26/2015   K 3.5 11/26/2015   CL 103 11/26/2015   CO2 27 11/26/2015   No results found for: INR, PROTIME  EKG: sinus bradycardia, frequent PVC's noted.   Anesthesia Physical  Anesthesia Plan  ASA: II  Anesthesia Plan: General   Post-op Pain Management:    Induction: Intravenous  Airway Management Planned: Oral ETT  Additional Equipment:   Intra-op Plan:   Post-operative Plan: Extubation in OR  Informed Consent: I have reviewed the patients History and Physical, chart, labs and discussed the procedure including the risks, benefits and alternatives for the proposed anesthesia with the patient or authorized representative who has indicated his/her understanding and acceptance.   Dental advisory  given  Plan Discussed with: CRNA  Anesthesia Plan Comments:         Anesthesia Quick Evaluation                                   Anesthesia Evaluation  Patient identified by MRN, date of birth, ID band Patient awake    Reviewed: Allergy & Precautions, NPO status , Patient's Chart, lab work & pertinent test results  Airway Mallampati: II       Dental  (+) Upper Dentures   Pulmonary former smoker,  breath sounds clear to auscultation        Cardiovascular hypertension, Pt. on medications negative cardio ROS  Rhythm:Regular Rate:Normal     Neuro/Psych negative neurological ROS  negative psych ROS   GI/Hepatic negative GI ROS, Neg liver ROS,   Endo/Other  negative endocrine ROS  Renal/GU   negative genitourinary   Musculoskeletal negative musculoskeletal ROS (+)   Abdominal   Peds negative pediatric ROS (+)  Hematology negative hematology ROS (+)   Anesthesia Other Findings   Reproductive/Obstetrics negative OB ROS                            Lab Results  Component Value Date   WBC 11.9* 11/26/2015   HGB 13.3 11/26/2015   HCT 38.5 11/26/2015   MCV 90.8  11/26/2015   PLT 211 11/26/2015   Lab Results  Component Value Date   CREATININE 0.80 11/26/2015   BUN 15 11/26/2015   NA 139 11/26/2015   K 3.5 11/26/2015   CL 103 11/26/2015   CO2 27 11/26/2015   No results found for: INR, PROTIME  EKG: sinus bradycardia, frequent PVC's noted.   Anesthesia Physical Anesthesia Plan  ASA: II  Anesthesia Plan: General   Post-op Pain Management:    Induction: Intravenous  Airway Management Planned: Oral ETT  Additional Equipment:   Intra-op Plan:   Post-operative Plan: Extubation in OR  Informed Consent: I have reviewed the patients History and Physical, chart, labs and discussed the procedure including the risks, benefits and alternatives for the proposed anesthesia with the patient or authorized  representative who has indicated his/her understanding and acceptance.   Dental advisory given  Plan Discussed with: CRNA  Anesthesia Plan Comments:         Anesthesia Quick Evaluation

## 2015-11-26 NOTE — ED Provider Notes (Signed)
CSN: [id number]     Arrival date & time 11/26/15  1644 History   First MD Initiated Contact with Patient 11/26/15 1735     Chief Complaint  Patient presents with  . Hip Injury    HPI   [name] is a 70 y.o. female with a PMH of HTN and HLD who presents to the ED with left hip pain. She states she fell while cleaning her pool prior to arrival and landed on her left hip. She reports pain since that time. She states movement exacerbates her pain. She has not tried anything for symptom relief. She denies hitting her head, LOC, additional injury, numbness, weakness, paresthesia. States she takes an ASA daily, though denies anticoagulant use.  Past Medical History  Diagnosis Date  . Hypertension   . High cholesterol    Past Surgical History  Procedure Laterality Date  . Tonsillectomy    . Abdominal hysterectomy    . Tubal ligation  1980  . Cholecystectomy N/A 01/21/2015    Procedure: LAPAROSCOPIC CHOLECYSTECTOMY;  Surgeon: Jimmye NormanJames Wyatt, MD;  [facility];  Service: General;  Laterality: N/A;  . Laparoscopy N/A 04/15/2015    Procedure: LAPAROSCOPY DIAGNOSTIC ileocalpexy ;  Surgeon: Karie SodaSteven Gross, MD;  Location: [facility] ORS;  Service: General;  Laterality: N/A;  . Small intestine surgery Right    No family history on file. Social History  Substance Use Topics  . Smoking status: Former Smoker    Quit date: 08/13/2012  . Smokeless tobacco: None  . Alcohol Use: Yes     Comment: 1 beer/day   OB History    No data available      Review of Systems  Respiratory: Negative for shortness of breath.   Cardiovascular: Negative for chest pain.  Gastrointestinal: Negative for nausea, vomiting and abdominal pain.  Musculoskeletal: Positive for arthralgias. Negative for back pain and neck pain.  Neurological: Negative for dizziness, syncope, weakness, light-headedness, numbness and headaches.  All other systems reviewed and are negative.     Allergies  Avelox; Penicillins; Morphine and  related; Sulfa antibiotics; and Aspirin  Home Medications   Prior to Admission medications   Medication Sig Start Date End Date Taking? Authorizing Provider  aspirin 81 MG tablet Take 81 mg by mouth at bedtime.     Historical Provider, MD  hydrochlorothiazide (MICROZIDE) 12.5 MG capsule Take 25 mg by mouth daily.     Historical Provider, MD  Omega-3 Fatty Acids (FISH OIL) 1000 MG CAPS Take 2,000 mg by mouth daily.    Historical Provider, MD  oxyCODONE-acetaminophen (PERCOCET/ROXICET) 5-325 MG per tablet Take 1 tablet by mouth every 4 (four) hours as needed for moderate pain or severe pain. 04/18/15   Glenna FellowsBenjamin Hoxworth, MD  pravastatin (PRAVACHOL) 20 MG tablet Take 80 mg by mouth daily.     Historical Provider, MD  traMADol (ULTRAM) 50 MG tablet Take 1-2 tablets (50-100 mg total) by mouth every 6 (six) hours as needed for moderate pain or severe pain. 01/21/15   Jimmye NormanJames Wyatt, MD    BP 148/87 mmHg  Pulse 78  Temp(Src) 99.1 F (37.3 C) (Oral)  Resp 16  Ht 5\' 2"  (1.575 m)  Wt 49.896 kg  BMI 20.11 kg/m2  SpO2 96% Physical Exam  Constitutional: She is oriented to person, place, and time. She appears well-developed and well-nourished. No distress.  HENT:  Head: Normocephalic and atraumatic.  Right Ear: External ear normal.  Left Ear: External ear normal.  Nose: Nose normal.  Mouth/Throat: Uvula  is midline, oropharynx is clear and moist and mucous membranes are normal.  Eyes: Conjunctivae, EOM and lids are normal. Pupils are equal, round, and reactive to light. Right eye exhibits no discharge. Left eye exhibits no discharge. No scleral icterus.  Neck: Normal range of motion. Neck supple.  Cardiovascular: Normal rate, regular rhythm, normal heart sounds, intact distal pulses and normal pulses.   Pulmonary/Chest: Effort normal and breath sounds normal. No respiratory distress. She has no wheezes. She has no rales. She exhibits no tenderness.  Abdominal: Soft. Normal appearance and bowel sounds  are normal. She exhibits no distension and no mass. There is no tenderness. There is no rigidity, no rebound and no guarding.  Musculoskeletal: She exhibits tenderness. She exhibits no edema.  TTP to left lateral hip with decreased ROM due to pain. Strength and sensation intact. Distal pulses intact. Otherwise, no TTP to upper or lower extremities bilaterally.  Neurological: She is alert and oriented to person, place, and time. She has normal strength. No sensory deficit.  Skin: Skin is warm, dry and intact. No rash noted. She is not diaphoretic. No erythema. No pallor.  Psychiatric: She has a normal mood and affect. Her speech is normal and behavior is normal.  Nursing note and vitals reviewed.   ED Course  Procedures (including critical care time)  Labs Review Labs Reviewed  CBC WITH DIFFERENTIAL/PLATELET - Abnormal; Notable for the following:    WBC 11.9 (*)    Neutro Abs 9.6 (*)    All other components within normal limits  BASIC METABOLIC PANEL    Imaging Review Dg Hip Unilat With Pelvis 2-3 Views Left  11/26/2015  CLINICAL DATA:  Left hip pain after fall EXAM: DG HIP (WITH OR WITHOUT PELVIS) 2-3V LEFT COMPARISON:  None. FINDINGS: Mildly impacted nondisplaced subcapital left femoral neck fracture. No dislocation or appreciable arthropathy in the left hip. No suspicious focal osseous lesion. Suggestion of diffuse osteopenia. IMPRESSION: Mildly impacted nondisplaced subcapital left femoral neck fracture. Suggestion of diffuse osteopenia. Electronically Signed   By: Delbert Phenix M.D.   On: 11/26/2015 17:42   I have personally reviewed and evaluated these images and lab results as part of my medical decision-making.   EKG Interpretation None      MDM   Final diagnoses:  Closed left hip fracture, initial encounter [facility])    70 year old female presents with left hip pain s/p mechanical fall prior to arrival. Denies hitting her head, LOC, additional injury, numbness, weakness,  paresthesia. States she takes an ASA daily. Denies anticoagulant use. Patient is afebrile. Vital signs stable. TTP to left lateral hip with decreased ROM due to pain. Strength and sensation intact. Distal pulses intact. Otherwise, no TTP to upper or lower extremities bilaterally. Imaging remarkable for mildly impacted nondisplaced subcapital left femoral neck fracture. Discussed findings with patient. She declines pain medication. Patient discussed with and seen by Dr. Cyndie Chime. Ortho consulted. Spoke with Dr. Ophelia Charter, who advised he may operate tonight and requested patient be NPO and admitted to [facility]. CBC remarkable for leukocytosis of 11.9. BMP unremarkable. Hospitalist consulted. Patient to be admitted to triad for further evaluation and management.  BP 148/87 mmHg  Pulse 78  Temp(Src) 99.1 F (37.3 C) (Oral)  Resp 16  Ht  (1.575 m)  Wt 49.896 kg  BMI 20.11 kg/m2  SpO2 96%      Mady Gemma, PA-C 11/26/15 1905  Leta Baptist, MD 11/27/15 [phone]

## 2015-11-26 NOTE — Progress Notes (Signed)
Patient ID: Lin GivensLouellen H Deloatch, female   DOB: 06-03-1946, 70 y.o.   MRN: [id number] Left femoral neck fx slight angulation. [facility] first case 7:30 AM for intramedullary compression screw of left hip. She may eat tonight.

## 2015-11-27 ENCOUNTER — Inpatient Hospital Stay (HOSPITAL_COMMUNITY): Payer: Medicare Other | Admitting: Certified Registered Nurse Anesthetist

## 2015-11-27 ENCOUNTER — Encounter (HOSPITAL_COMMUNITY)
Admission: EM | Disposition: A | Discharge status: Home Health | Payer: Self-pay | Source: Home / Self Care | Attending: Internal Medicine

## 2015-11-27 ENCOUNTER — Inpatient Hospital Stay (HOSPITAL_COMMUNITY): Payer: Medicare Other

## 2015-11-27 ENCOUNTER — Encounter (HOSPITAL_COMMUNITY): Payer: Self-pay | Admitting: Family Medicine

## 2015-11-27 DIAGNOSIS — S72009A Fracture of unspecified part of neck of unspecified femur, initial encounter for closed fracture: Secondary | ICD-10-CM | POA: Diagnosis present

## 2015-11-27 DIAGNOSIS — S72002A Fracture of unspecified part of neck of left femur, initial encounter for closed fracture: Secondary | ICD-10-CM

## 2015-11-27 DIAGNOSIS — Z01818 Encounter for other preprocedural examination: Secondary | ICD-10-CM

## 2015-11-27 HISTORY — PX: INTRAMEDULLARY (IM) NAIL INTERTROCHANTERIC: SHX5875

## 2015-11-27 HISTORY — DX: Fracture of unspecified part of neck of unspecified femur, initial encounter for closed fracture: S72.009A

## 2015-11-27 LAB — CBC
HCT: 35.6 % — ABNORMAL LOW (ref 36.0–46.0)
Hemoglobin: 12.3 g/dL (ref 12.0–15.0)
MCH: 30.8 pg (ref 26.0–34.0)
MCHC: 34.6 g/dL (ref 30.0–36.0)
MCV: 89 fL (ref 78.0–100.0)
PLATELETS: 191 10*3/uL (ref 150–400)
RBC: 4 MIL/uL (ref 3.87–5.11)
RDW: 12.3 % (ref 11.5–15.5)
WBC: 10.9 10*3/uL — AB (ref 4.0–10.5)

## 2015-11-27 LAB — CREATININE, SERUM: CREATININE: 0.79 mg/dL (ref 0.44–1.00)

## 2015-11-27 LAB — TYPE AND SCREEN
ABO/RH(D): A POS
ANTIBODY SCREEN: NEGATIVE

## 2015-11-27 LAB — URINALYSIS, ROUTINE W REFLEX MICROSCOPIC
BILIRUBIN URINE: NEGATIVE
Glucose, UA: NEGATIVE mg/dL
Hgb urine dipstick: NEGATIVE
Ketones, ur: NEGATIVE mg/dL
LEUKOCYTES UA: NEGATIVE
NITRITE: NEGATIVE
Protein, ur: NEGATIVE mg/dL
SPECIFIC GRAVITY, URINE: 1.012 (ref 1.005–1.030)
pH: 7 (ref 5.0–8.0)

## 2015-11-27 LAB — PROTIME-INR
INR: 0.96 (ref 0.00–1.49)
PROTHROMBIN TIME: 13 s (ref 11.6–15.2)

## 2015-11-27 LAB — MRSA PCR SCREENING: MRSA BY PCR: NEGATIVE

## 2015-11-27 SURGERY — FIXATION, FRACTURE, INTERTROCHANTERIC, WITH INTRAMEDULLARY ROD
Anesthesia: General | Site: Hip | Laterality: Left

## 2015-11-27 MED ORDER — ENOXAPARIN SODIUM 40 MG/0.4ML ~~LOC~~ SOLN: 40.0000 mg | SUBCUTANEOUS | Status: DC

## 2015-11-27 MED ORDER — ONDANSETRON HCL 4 MG/2ML IJ SOLN: 4.0000 mg | Freq: Four times a day (QID) | INTRAMUSCULAR | Status: DC | PRN

## 2015-11-27 MED ORDER — FENTANYL CITRATE (PF) 100 MCG/2ML IJ SOLN
INTRAMUSCULAR | Status: AC
  Filled 2015-11-27: qty 2

## 2015-11-27 MED ORDER — POLYETHYLENE GLYCOL 3350 17 G PO PACK: 17.0000 g | PACK | Freq: Every day | ORAL | Status: DC | PRN

## 2015-11-27 MED ORDER — SODIUM CHLORIDE 0.9 % IV SOLN
INTRAVENOUS | Status: AC
  Administered 2015-11-27: 01:00:00 via INTRAVENOUS

## 2015-11-27 MED ORDER — ACETAMINOPHEN 650 MG RE SUPP: 650.0000 mg | Freq: Four times a day (QID) | RECTAL | Status: DC | PRN

## 2015-11-27 MED ORDER — LIDOCAINE HCL (CARDIAC) 20 MG/ML IV SOLN
INTRAVENOUS | Status: AC
  Filled 2015-11-27: qty 5

## 2015-11-27 MED ORDER — LACTATED RINGERS IV SOLN
INTRAVENOUS | Status: DC
  Administered 2015-11-27: 07:00:00 via INTRAVENOUS

## 2015-11-27 MED ORDER — MENTHOL 3 MG MT LOZG: 1.0000 | LOZENGE | OROMUCOSAL | Status: DC | PRN

## 2015-11-27 MED ORDER — METHOCARBAMOL 500 MG PO TABS
500.0000 mg | ORAL_TABLET | Freq: Four times a day (QID) | ORAL | Status: DC | PRN
  Administered 2015-11-27 – 2015-11-28 (×3): 500 mg via ORAL
  Filled 2015-11-27 (×5): qty 1

## 2015-11-27 MED ORDER — FENTANYL CITRATE (PF) 100 MCG/2ML IJ SOLN
12.5000 ug | INTRAMUSCULAR | Status: DC | PRN
  Administered 2015-11-27 (×2): 50 ug via INTRAVENOUS
  Administered 2015-11-27 (×2): 12.5 ug via INTRAVENOUS
  Filled 2015-11-27: qty 2

## 2015-11-27 MED ORDER — EPHEDRINE SULFATE 50 MG/ML IJ SOLN
INTRAMUSCULAR | Status: DC | PRN
  Administered 2015-11-27: 5 mg via INTRAVENOUS

## 2015-11-27 MED ORDER — PHENYLEPHRINE HCL 10 MG/ML IJ SOLN
INTRAMUSCULAR | Status: DC | PRN
  Administered 2015-11-27 (×4): 80 ug via INTRAVENOUS

## 2015-11-27 MED ORDER — ACETAMINOPHEN 10 MG/ML IV SOLN: 1000.0000 mg | Freq: Once | INTRAVENOUS | Status: DC

## 2015-11-27 MED ORDER — DIPHENHYDRAMINE HCL 50 MG/ML IJ SOLN
INTRAMUSCULAR | Status: AC
  Filled 2015-11-27: qty 1

## 2015-11-27 MED ORDER — PROPOFOL 10 MG/ML IV BOLUS
INTRAVENOUS | Status: AC
  Filled 2015-11-27: qty 20

## 2015-11-27 MED ORDER — ONDANSETRON HCL 4 MG/2ML IJ SOLN
INTRAMUSCULAR | Status: DC | PRN
  Administered 2015-11-27: 4 mg via INTRAVENOUS

## 2015-11-27 MED ORDER — FENTANYL CITRATE (PF) 100 MCG/2ML IJ SOLN
25.0000 ug | INTRAMUSCULAR | Status: DC | PRN
  Administered 2015-11-27 (×2): 50 ug via INTRAVENOUS

## 2015-11-27 MED ORDER — METHOCARBAMOL 1000 MG/10ML IJ SOLN
500.0000 mg | Freq: Four times a day (QID) | INTRAVENOUS | Status: DC | PRN
  Filled 2015-11-27: qty 5

## 2015-11-27 MED ORDER — LIDOCAINE HCL (CARDIAC) 20 MG/ML IV SOLN
INTRAVENOUS | Status: DC | PRN
  Administered 2015-11-27: 60 mg via INTRAVENOUS

## 2015-11-27 MED ORDER — PRAVASTATIN SODIUM 80 MG PO TABS
80.0000 mg | ORAL_TABLET | Freq: Every day | ORAL | Status: DC
  Administered 2015-11-27 – 2015-11-28 (×2): 80 mg via ORAL
  Filled 2015-11-27 (×3): qty 1

## 2015-11-27 MED ORDER — OMEGA-3-ACID ETHYL ESTERS 1 G PO CAPS
1.0000 g | ORAL_CAPSULE | Freq: Every day | ORAL | Status: DC
  Administered 2015-11-27 – 2015-11-29 (×3): 1 g via ORAL
  Filled 2015-11-27 (×3): qty 1

## 2015-11-27 MED ORDER — ONDANSETRON HCL 4 MG/2ML IJ SOLN
INTRAMUSCULAR | Status: AC
  Filled 2015-11-27: qty 2

## 2015-11-27 MED ORDER — MIDAZOLAM HCL 5 MG/5ML IJ SOLN
INTRAMUSCULAR | Status: DC | PRN
  Administered 2015-11-27: 2 mg via INTRAVENOUS

## 2015-11-27 MED ORDER — DIPHENHYDRAMINE HCL 50 MG/ML IJ SOLN
12.5000 mg | Freq: Once | INTRAMUSCULAR | Status: AC
  Administered 2015-11-27: 12.5 mg via INTRAVENOUS

## 2015-11-27 MED ORDER — ACETAMINOPHEN 325 MG PO TABS: 650.0000 mg | ORAL_TABLET | Freq: Four times a day (QID) | ORAL | Status: DC | PRN

## 2015-11-27 MED ORDER — FENTANYL CITRATE (PF) 250 MCG/5ML IJ SOLN
INTRAMUSCULAR | Status: AC
  Filled 2015-11-27: qty 5

## 2015-11-27 MED ORDER — HYDROCHLOROTHIAZIDE 25 MG PO TABS
25.0000 mg | ORAL_TABLET | Freq: Every day | ORAL | Status: DC
  Administered 2015-11-27: 25 mg via ORAL
  Filled 2015-11-27: qty 1

## 2015-11-27 MED ORDER — MIDAZOLAM HCL 2 MG/2ML IJ SOLN
INTRAMUSCULAR | Status: AC
  Filled 2015-11-27: qty 2

## 2015-11-27 MED ORDER — BUPIVACAINE HCL (PF) 0.5 % IJ SOLN
INTRAMUSCULAR | Status: AC
  Filled 2015-11-27: qty 30

## 2015-11-27 MED ORDER — CEFAZOLIN SODIUM-DEXTROSE 2-4 GM/100ML-% IV SOLN
2.0000 g | Freq: Once | INTRAVENOUS | Status: AC
  Administered 2015-11-27: 2 g via INTRAVENOUS

## 2015-11-27 MED ORDER — SODIUM CHLORIDE 0.45 % IV SOLN
INTRAVENOUS | Status: DC
  Administered 2015-11-27: 75 mL/h via INTRAVENOUS

## 2015-11-27 MED ORDER — ENOXAPARIN SODIUM 40 MG/0.4ML ~~LOC~~ SOLN
40.0000 mg | SUBCUTANEOUS | Status: DC
  Administered 2015-11-28 – 2015-11-29 (×2): 40 mg via SUBCUTANEOUS
  Filled 2015-11-27 (×3): qty 0.4

## 2015-11-27 MED ORDER — HYDROMORPHONE HCL 1 MG/ML IJ SOLN: 1.0000 mg | Freq: Once | INTRAMUSCULAR | Status: DC

## 2015-11-27 MED ORDER — METHOCARBAMOL 500 MG PO TABS: 500.0000 mg | ORAL_TABLET | Freq: Four times a day (QID) | ORAL | Status: DC | PRN

## 2015-11-27 MED ORDER — BISACODYL 10 MG RE SUPP
10.0000 mg | Freq: Every day | RECTAL | Status: DC | PRN
  Administered 2015-11-29: 10 mg via RECTAL
  Filled 2015-11-27: qty 1

## 2015-11-27 MED ORDER — ONDANSETRON HCL 4 MG PO TABS
4.0000 mg | ORAL_TABLET | Freq: Four times a day (QID) | ORAL | Status: DC | PRN
  Administered 2015-11-28: 4 mg via ORAL
  Filled 2015-11-27: qty 1

## 2015-11-27 MED ORDER — CEFAZOLIN SODIUM-DEXTROSE 2-4 GM/100ML-% IV SOLN
INTRAVENOUS | Status: AC
  Filled 2015-11-27: qty 100

## 2015-11-27 MED ORDER — DOCUSATE SODIUM 100 MG PO CAPS
100.0000 mg | ORAL_CAPSULE | Freq: Two times a day (BID) | ORAL | Status: DC
  Administered 2015-11-27 – 2015-11-29 (×5): 100 mg via ORAL

## 2015-11-27 MED ORDER — PHENOL 1.4 % MT LIQD: 1.0000 | OROMUCOSAL | Status: DC | PRN

## 2015-11-27 MED ORDER — METOCLOPRAMIDE HCL 5 MG/ML IJ SOLN
5.0000 mg | Freq: Three times a day (TID) | INTRAMUSCULAR | Status: DC | PRN
  Administered 2015-11-28: 10 mg via INTRAVENOUS
  Filled 2015-11-27: qty 2

## 2015-11-27 MED ORDER — LACTATED RINGERS IV SOLN: INTRAVENOUS | Status: DC

## 2015-11-27 MED ORDER — BUPIVACAINE HCL 0.5 % IJ SOLN
INTRAMUSCULAR | Status: DC | PRN
  Administered 2015-11-27: 15 mL

## 2015-11-27 MED ORDER — DEXAMETHASONE SODIUM PHOSPHATE 10 MG/ML IJ SOLN
INTRAMUSCULAR | Status: AC
  Filled 2015-11-27: qty 1

## 2015-11-27 MED ORDER — 0.9 % SODIUM CHLORIDE (POUR BTL) OPTIME
TOPICAL | Status: DC | PRN
  Administered 2015-11-27: 1000 mL

## 2015-11-27 MED ORDER — SUCCINYLCHOLINE CHLORIDE 20 MG/ML IJ SOLN
INTRAMUSCULAR | Status: DC | PRN
  Administered 2015-11-27: 100 mg via INTRAVENOUS

## 2015-11-27 MED ORDER — FENTANYL CITRATE (PF) 100 MCG/2ML IJ SOLN: 25.0000 ug | INTRAMUSCULAR | Status: DC | PRN

## 2015-11-27 MED ORDER — ACETAMINOPHEN 10 MG/ML IV SOLN
INTRAVENOUS | Status: AC
  Filled 2015-11-27: qty 100

## 2015-11-27 MED ORDER — SORBITOL 70 % SOLN
30.0000 mL | Freq: Every day | Status: DC | PRN
  Filled 2015-11-27: qty 30

## 2015-11-27 MED ORDER — OXYCODONE-ACETAMINOPHEN 5-325 MG PO TABS
2.0000 | ORAL_TABLET | ORAL | Status: DC | PRN
  Administered 2015-11-27: 2 via ORAL
  Administered 2015-11-27 – 2015-11-28 (×4): 1 via ORAL
  Filled 2015-11-27 (×5): qty 2

## 2015-11-27 MED ORDER — FENTANYL CITRATE (PF) 100 MCG/2ML IJ SOLN
25.0000 ug | Freq: Once | INTRAMUSCULAR | Status: AC
  Administered 2015-11-27: 25 ug via INTRAVENOUS
  Filled 2015-11-27: qty 2

## 2015-11-27 MED ORDER — METHOCARBAMOL 1000 MG/10ML IJ SOLN: 500.0000 mg | Freq: Four times a day (QID) | INTRAVENOUS | Status: DC | PRN

## 2015-11-27 MED ORDER — PROPOFOL 10 MG/ML IV BOLUS
INTRAVENOUS | Status: DC | PRN
  Administered 2015-11-27: 130 mg via INTRAVENOUS

## 2015-11-27 MED ORDER — HYDROMORPHONE HCL 1 MG/ML IJ SOLN: 0.5000 mg | INTRAMUSCULAR | Status: DC | PRN

## 2015-11-27 MED ORDER — METOCLOPRAMIDE HCL 10 MG PO TABS: 5.0000 mg | ORAL_TABLET | Freq: Three times a day (TID) | ORAL | Status: DC | PRN

## 2015-11-27 SURGICAL SUPPLY — 42 items
BAG ZIPLOCK 12X15 (MISCELLANEOUS) ×3 IMPLANT
BIT DRILL CANN LG 4.3MM (BIT) ×1 IMPLANT
BNDG COHESIVE 6X5 TAN STRL LF (GAUZE/BANDAGES/DRESSINGS) ×9 IMPLANT
DRAPE C-ARM 42X120 X-RAY (DRAPES) ×3 IMPLANT
DRAPE INCISE IOBAN 66X45 STRL (DRAPES) ×3 IMPLANT
DRAPE ORTHO SPLIT 77X108 STRL (DRAPES) ×2
DRAPE SHEET LG 3/4 BI-LAMINATE (DRAPES) ×3 IMPLANT
DRAPE STERI IOBAN 125X83 (DRAPES) ×3 IMPLANT
DRAPE SURG ORHT 6 SPLT 77X108 (DRAPES) ×1 IMPLANT
DRAPE U-SHAPE 47X51 STRL (DRAPES) ×3 IMPLANT
DRILL BIT CANN LG 4.3MM (BIT) ×3
DRSG PAD ABDOMINAL 8X10 ST (GAUZE/BANDAGES/DRESSINGS) ×3 IMPLANT
DURAPREP 26ML APPLICATOR (WOUND CARE) ×3 IMPLANT
ELECT REM PT RETURN 9FT ADLT (ELECTROSURGICAL) ×3
ELECTRODE REM PT RTRN 9FT ADLT (ELECTROSURGICAL) ×1 IMPLANT
FACESHIELD WRAPAROUND (MASK) ×6 IMPLANT
GAUZE SPONGE 4X4 12PLY STRL (GAUZE/BANDAGES/DRESSINGS) ×3 IMPLANT
GAUZE XEROFORM 5X9 LF (GAUZE/BANDAGES/DRESSINGS) ×3 IMPLANT
GLOVE ORTHO TXT STRL SZ7.5 (GLOVE) ×3 IMPLANT
GLOVE SURG ORTHO 8.5 STRL (GLOVE) ×3 IMPLANT
GOWN STRL REUS W/TWL LRG LVL3 (GOWN DISPOSABLE) ×6 IMPLANT
GUIDEPIN 3.2X17.5 THRD DISP (PIN) ×6 IMPLANT
HFN A/R SCREW 65MM (Screw) ×3 IMPLANT
KIT BASIN OR (CUSTOM PROCEDURE TRAY) ×3 IMPLANT
MANIFOLD NEPTUNE II (INSTRUMENTS) ×3 IMPLANT
NAIL HIP FRACT 130D 11X180 (Screw) ×3 IMPLANT
PACK GENERAL/GYN (CUSTOM PROCEDURE TRAY) ×3 IMPLANT
PACK ORTHO EXTREMITY (CUSTOM PROCEDURE TRAY) ×3 IMPLANT
PAD CAST 4YDX4 CTTN HI CHSV (CAST SUPPLIES) ×1 IMPLANT
PADDING CAST COTTON 4X4 STRL (CAST SUPPLIES) ×2
POSITIONER SURGICAL ARM (MISCELLANEOUS) ×3 IMPLANT
SCREW BONE CORTICAL 5.0X38 (Screw) ×3 IMPLANT
SCREW DRILL BIT ANIT ROTATION (BIT) ×3 IMPLANT
SCREW LAG 6.5X80X10.5XLRG ST (Screw) ×1 IMPLANT
SCREW LAG 80MM (Screw) ×2 IMPLANT
STAPLER VISISTAT (STAPLE) ×3 IMPLANT
STOCKINETTE 8 INCH (MISCELLANEOUS) ×3 IMPLANT
SUT VIC AB 0 CT1 36 (SUTURE) ×6 IMPLANT
SUT VIC AB 2-0 CT1 27 (SUTURE) ×2
SUT VIC AB 2-0 CT1 TAPERPNT 27 (SUTURE) ×1 IMPLANT
TAPE CLOTH SURG 6X10 WHT LF (GAUZE/BANDAGES/DRESSINGS) ×3 IMPLANT
TOWEL OR 17X26 10 PK STRL BLUE (TOWEL DISPOSABLE) ×6 IMPLANT

## 2015-11-27 NOTE — Progress Notes (Signed)
[facility] PROGRESS NOTE  Lynn GivensLouellen H Summers ZOX:[id number] DOB: 1946-04-02 DOA: 11/26/2015 PCP: Agustina CaroliHICKS, KRISTIN D, MD  Assessment/Plan: 1. Left hip fracture  - Suffered ground-level mechanical fall onto left hip  - Radiographs reveal subcapital femoral neck fx  - Holding ASA 81, last dose was 4/15  -S/P Intramedullary nail left intertrochanteric 4-16. -pt per ortho.   2. Hypertension  -Hold HCTZ post op. SBP in the 100   3. Hyperlipidemia  - Continue current-dose Pravachol 80 mg qHS   DVT ppx: SQ Lovenox   Code Status: Full Code.  Family Communication: care discussed with patient.  Disposition Plan: remain inpatient.    Consultants:  Dr Ophelia Charteryates   Procedures: ORIF left femoral neck Fracture   Antibiotics: none HPI/Subjective: Doing ok, wants to stand up   Objective: Filed Vitals:   11/27/15 1247 11/27/15 1414  BP: 106/74 114/71  Pulse: 79 72  Temp: 99.1 F (37.3 C) 98.6 F (37 C)  Resp: 16 15    Intake/Output Summary (Last 24 hours) at 11/27/15 1510 Last data filed at 11/27/15 1415  Gross per 24 hour  Intake 1938.75 ml  Output   1235 ml  Net 703.75 ml   Filed Weights   11/26/15 1654 11/26/15 2339 11/27/15 0940  Weight: 49.896 kg (110 lb) 49.896 kg (110 lb) 49.896 kg (110 lb)    Exam:   General:  Alert in no distress  Cardiovascular: S 1, S 2 RRR  Respiratory: CTA  Abdomen: BS present , soft.   Musculoskeletal: dressing left hip   Data Reviewed: Basic Metabolic Panel:  Recent Labs Lab 11/26/15 1805 11/27/15 1019  NA 139  --   K 3.5  --   CL 103  --   CO2 27  --   GLUCOSE 99  --   BUN 15  --   CREATININE 0.80 0.79  CALCIUM 9.1  --    Liver Function Tests: No results for input(s): AST, ALT, ALKPHOS, BILITOT, PROT, ALBUMIN in the last 168 hours. No results for input(s): LIPASE, AMYLASE in the last 168 hours. No results for input(s): AMMONIA in the last 168 hours. CBC:  Recent Labs Lab 11/26/15 1805  11/27/15 1019  WBC 11.9* 10.9*  NEUTROABS 9.6*  --   HGB 13.3 12.3  HCT 38.5 35.6*  MCV 90.8 89.0  PLT 211 191   Cardiac Enzymes: No results for input(s): CKTOTAL, CKMB, CKMBINDEX, TROPONINI in the last 168 hours. BNP (last 3 results) No results for input(s): BNP in the last 8760 hours.  ProBNP (last 3 results) No results for input(s): PROBNP in the last 8760 hours.  CBG: No results for input(s): GLUCAP in the last 168 hours.  Recent Results (from the past 240 hour(s))  MRSA PCR Screening     Status: None   Collection Time: 11/27/15  5:32 AM  Result Value Ref Range Status   MRSA by PCR NEGATIVE NEGATIVE Final    Comment:        The GeneXpert MRSA Assay (FDA approved for NASAL specimens only), is one component of a comprehensive MRSA colonization surveillance program. It is not intended to diagnose MRSA infection nor to guide or monitor treatment for MRSA infections.      Studies: Chest Portable 1 View  11/27/2015  CLINICAL DATA:  Preoperative examination prior to orthopedic surgery. Ex-smoker. History of hypertension. EXAM: PORTABLE CHEST 1 VIEW COMPARISON:  None. FINDINGS: Borderline enlarged cardiac silhouette. Tortuous and partially calcified thoracic aorta. Diffusely prominent interstitial markings. Mild scoliosis. IMPRESSION:  Borderline cardiomegaly and mild to moderate chronic interstitial lung disease. Electronically Signed   By: Beckie Salts M.D.   On: 11/27/2015 07:31   Dg C-arm 1-60 Min-no Report  11/27/2015  CLINICAL DATA: Left hip IM nail C-ARM 1-60 MINUTES Fluoroscopy was utilized by the requesting physician.  No radiographic interpretation.   Dg Hip Operative Unilat W Or W/o Pelvis Left  11/27/2015  CLINICAL DATA:  70 year old female with a history of fractured hip EXAM: DG C-ARM 1-60 MIN-NO REPORT; OPERATIVE LEFT HIP WITH PELVIS FLUOROSCOPY TIME:  Please refer to the dictated operative report for full details of intraoperative findings and procedure.  COMPARISON:  11/26/2015 FINDINGS: Intraoperative fluoroscopic spot images. Surgical changes of antegrade left femoral intra medullary rod placement with femoral neck screw fixation for repair of left femoral neck fracture. Single distal interlocking screw. No complicating features. IMPRESSION: Intraoperative fluoroscopic spot images of the left hip ORIF, as above, with no complicating features. Please refer to the dictated operative report for full details of intraoperative findings and procedure. Signed, Yvone Neu. Loreta Ave, DO [facility] Electronically Signed   By: Gilmer Mor D.O.   On: 11/27/2015 10:17   Dg Hip Unilat With Pelvis 2-3 Views Left  11/26/2015  CLINICAL DATA:  Left hip pain after fall EXAM: DG HIP (WITH OR WITHOUT PELVIS) 2-3V LEFT COMPARISON:  None. FINDINGS: Mildly impacted nondisplaced subcapital left femoral neck fracture. No dislocation or appreciable arthropathy in the left hip. No suspicious focal osseous lesion. Suggestion of diffuse osteopenia. IMPRESSION: Mildly impacted nondisplaced subcapital left femoral neck fracture. Suggestion of diffuse osteopenia. Electronically Signed   By: Delbert Phenix M.D.   On: 11/26/2015 17:42    Scheduled Meds: . diphenhydrAMINE      . docusate sodium  100 mg Oral BID  . [START ON 11/28/2015] enoxaparin (LOVENOX) injection  40 mg Subcutaneous Q24H  . fentaNYL      . omega-3 acid ethyl esters  1 g Oral Daily  . pravastatin  80 mg Oral q1800   Continuous Infusions: . sodium chloride 75 mL/hr (11/27/15 1022)  . lactated ringers      Principal Problem:   Closed left hip fracture (HCC) Active Problems:   Essential (primary) hypertension   Hypercholesteremia   Hip fracture (HCC)   Preop testing    Time spent: 25 minutes.     Hartley Barefoot A  [facility] Pager [phone]. If 7PM-7AM, please contact night-coverage at [email or web address], password [facility] 11/27/2015, 3:10 PM   LOS: 1 day

## 2015-11-27 NOTE — Interval H&P Note (Signed)
History and Physical Interval Note:  11/27/2015 7:20 AM  [name]  has presented today for surgery, with the diagnosis of left intertrochanteric hip fracture  The various methods of treatment have been discussed with the patient and family. After consideration of risks, benefits and other options for treatment, the patient has consented to  Procedure(s): INTRAMEDULLARY (IM) NAIL INTERTROCHANTRIC (Left) as a surgical intervention .  The patient's history has been reviewed, patient examined, no change in status, stable for surgery.  I have reviewed the patient's chart and labs.  Questions were answered to the patient's satisfaction.     Trebor Galdamez C

## 2015-11-27 NOTE — Anesthesia Postprocedure Evaluation (Signed)
Anesthesia Post Note  Patient: [name]  Procedure(s) Performed: Procedure(s) (LRB): INTRAMEDULLARY (IM) NAIL INTERTROCHANTRIC (Left)  Patient location during evaluation: PACU Anesthesia Type: General Level of consciousness: sedated Pain management: satisfactory to patient Vital Signs Assessment: post-procedure vital signs reviewed and stable Respiratory status: spontaneous breathing Cardiovascular status: stable Anesthetic complications: no    Last Vitals:  Filed Vitals:   11/27/15 0930 11/27/15 0940  BP: 137/80 141/79  Pulse: 73 83  Temp:  37.1 C  Resp: 16 16    Last Pain:  Filed Vitals:   11/27/15 1000  PainSc: 4                  [name]

## 2015-11-27 NOTE — Transfer of Care (Signed)
Immediate Anesthesia Transfer of Care Note  Patient: [name]  Procedure(s) Performed: Procedure(s): INTRAMEDULLARY (IM) NAIL INTERTROCHANTRIC (Left)  Patient Location: PACU  Anesthesia Type:General  Level of Consciousness:  sedated, patient cooperative and responds to stimulation  Airway & Oxygen Therapy:Patient Spontanous Breathing and Patient connected to face mask oxgen  Post-op Assessment:  Report given to PACU RN and Post -op Vital signs reviewed and stable  Post vital signs:  Reviewed and stable  Last Vitals:  Filed Vitals:   11/26/15 2339 11/27/15 0514  BP: 132/76 132/81  Pulse: 79 82  Temp: 37.9 C 37.9 C  Resp: 20 18    Complications: No apparent anesthesia complications

## 2015-11-27 NOTE — H&P (Signed)
[facility] History and Physical  [name]:[id number] DOB: 1946-07-07 DOA: 11/26/2015  Referring physician: ED physician PCP: Agustina Caroli, MD  Specialists: None listed  Chief Complaint:  Fall, left hip pain   HPI: [name] is a generally healthy 70 y.o. female with PMH of hypertension and hyperlipidemia who presents to the ED with severe acute left hip pain following a ground-level mechanical fall at home. Patient had been in her usual state of fairly good health until just prior to her presentation when she slipped while cleaning her swimming pool, falling hard onto the left hip. She experienced immediate pain and difficulty ambulating and EMS was activated for transport to the hospital. She denies any head strike or loss of consciousness and does not take anticoagulants. She does take a daily 81 mg aspirin. She is a former smoker but denies any underlying cardiopulmonary disease. She was transported to the emergency department at [facility].  In the ED at [facility], patient was found to be afebrile, saturating well on room air, and with vital signs stable. CBC and BMP were obtained and notable only for a leukocytosis to 11,900 with neutrophilic predominance. Radiographs of the left hip and pelvis reveal a mildly impacted nondisplaced subcapital left femoral neck fracture and diffuse osteopenia. Orthopedic surgery was consulted by the EDP and recommended admission to [facility] for surgical repair of the fracture in the morning. She was given IV fentanyl for pain, remained hemodynamically stable, and was transported to [facility].  The patient is evaluated on the medical-surgical unit at [facility] where she was found to be in no apparent respiratory distress distress and with hemodynamic stability. She is complaining of severe left hip pain after just attempting to adjust her position in bed. Side from the hip pain, she reports pain in  her usual state. She denies fevers, chills, cough, dyspnea, chest pain, or palpitations. She understands that there is tentative plans for surgery in the morning and she has no questions at this time.  Where does patient live?   At home    Can patient participate in ADLs?  Yes        Review of Systems:   General: no fevers, chills, sweats, weight change, poor appetite, or fatigue HEENT: no blurry vision, hearing changes or sore throat Pulm: no dyspnea, cough, or wheeze CV: no chest pain or palpitations Abd: no nausea, vomiting, abdominal pain, diarrhea, or constipation GU: no dysuria, hematuria, increased urinary frequency, or urgency  Ext: no leg edema Neuro: no focal weakness, numbness, or tingling, no vision change or hearing loss Skin: no rash, no wounds MSK: No muscle spasm, no deformity, no red, hot, or swollen joint Heme: No easy bruising or bleeding Travel history: No recent long distant travel    Allergy:  Allergies  Allergen Reactions  . Avelox [Moxifloxacin] Swelling  . Penicillins Other (See Comments)    Reported whole-body skin peeling as a child   . Buprenorphine Hcl Nausea And Vomiting    vomiting  . Morphine And Related Other (See Comments)    vomiting  . Procaine Other (See Comments)    unknown  . Sulfa Antibiotics Other (See Comments)    Unknown  . Sulfacetamide Sodium Other (See Comments)    Unknown  . Aspirin Other (See Comments)    vomiting    Past Medical History  Diagnosis Date  . Hypertension   . High cholesterol     Past Surgical History  Procedure Laterality Date  . Tonsillectomy    . Abdominal hysterectomy    . Tubal ligation  1980  . Cholecystectomy N/A 01/21/2015    Procedure: LAPAROSCOPIC CHOLECYSTECTOMY;  Surgeon: Jimmye Norman, MD;  Location: [facility] OR;  Service: General;  Laterality: N/A;  . Laparoscopy N/A 04/15/2015    Procedure: LAPAROSCOPY DIAGNOSTIC ileocalpexy ;  Surgeon: Karie Soda, MD;  Location: [facility] ORS;  Service: General;   Laterality: N/A;  . Small intestine surgery Right     Social History:  reports that she quit smoking about 3 years ago. She does not have any smokeless tobacco history on file. She reports that she drinks alcohol. She reports that she does not use illicit drugs.  Family History: History reviewed. No pertinent family history.   Prior to Admission medications   Medication Sig Start Date End Date Taking? Authorizing Provider  aspirin 81 MG tablet Take 81 mg by mouth at bedtime.    Yes Historical Provider, MD  hydrochlorothiazide (MICROZIDE) 12.5 MG capsule Take 25 mg by mouth daily.    Yes Historical Provider, MD  naproxen sodium (ANAPROX) 220 MG tablet Take 440 mg by mouth 2 (two) times daily with a meal.   Yes Historical Provider, MD  Omega-3 Fatty Acids (FISH OIL) 1000 MG CAPS Take 2,000 mg by mouth daily.   Yes Historical Provider, MD  pravastatin (PRAVACHOL) 80 MG tablet Take 80 mg by mouth daily.  09/29/15  Yes Historical Provider, MD  oxyCODONE-acetaminophen (PERCOCET/ROXICET) 5-325 MG per tablet Take 1 tablet by mouth every 4 (four) hours as needed for moderate pain or severe pain. 04/18/15   Glenna Fellows, MD  traMADol (ULTRAM) 50 MG tablet Take 1-2 tablets (50-100 mg total) by mouth every 6 (six) hours as needed for moderate pain or severe pain. 01/21/15   Jimmye Norman, MD    Physical Exam: Filed Vitals:   11/26/15 1926 11/26/15 2114 11/26/15 2145 11/26/15 2339  BP: 137/83 147/91 162/93 132/76  Pulse: 82 82 81 79  Temp:    100.3 F (37.9 C)  TempSrc:    Oral  Resp: 18   20  Height:     (1.575 m)  Weight:    49.896 kg (110 lb)  SpO2: 96% 96% 95% 94%   General: Not in acute distress HEENT:       Eyes: PERRL, EOMI, no scleral icterus or conjunctival pallor.       ENT: No discharge from the ears or nose, no pharyngeal ulcers.        Neck: No JVD, no bruit, no appreciable mass Heme: No cervical adenopathy, no pallor Cardiac: S1/S2, RRR, No murmurs, No gallops or  rubs. Pulm: Good air movement bilaterally. No rales, wheezing, rhonchi or rubs. Abd: Soft, nondistended, nontender, no rebound pain or gaurding, BS present. Ext: No LE edema bilaterally. 2+DP/PT pulse bilaterally. Left leg neurovascularly intact.  Musculoskeletal: Left hip with subtle deformity, ecchymosis  Skin: No rashes or wounds on exposed surfaces  Neuro: Alert, oriented X3, cranial nerves II-XII grossly intact. No focal findings Psych: Patient is not overtly psychotic, appropriate mood and affect.  Labs on Admission:  Basic Metabolic Panel:  Recent Labs Lab 11/26/15 1805  NA 139  K 3.5  CL 103  CO2 27  GLUCOSE 99  BUN 15  CREATININE 0.80  CALCIUM 9.1   Liver Function Tests: No results for input(s): AST, ALT, ALKPHOS, BILITOT, PROT, ALBUMIN in the last 168 hours. No results for input(s): LIPASE, AMYLASE in the last 168  hours. No results for input(s): AMMONIA in the last 168 hours. CBC:  Recent Labs Lab 11/26/15 1805  WBC 11.9*  NEUTROABS 9.6*  HGB 13.3  HCT 38.5  MCV 90.8  PLT 211   Cardiac Enzymes: No results for input(s): CKTOTAL, CKMB, CKMBINDEX, TROPONINI in the last 168 hours.  BNP (last 3 results) No results for input(s): BNP in the last 8760 hours.  ProBNP (last 3 results) No results for input(s): PROBNP in the last 8760 hours.  CBG: No results for input(s): GLUCAP in the last 168 hours.  Radiological Exams on Admission: Dg Hip Unilat With Pelvis 2-3 Views Left  11/26/2015  CLINICAL DATA:  Left hip pain after fall EXAM: DG HIP (WITH OR WITHOUT PELVIS) 2-3V LEFT COMPARISON:  None. FINDINGS: Mildly impacted nondisplaced subcapital left femoral neck fracture. No dislocation or appreciable arthropathy in the left hip. No suspicious focal osseous lesion. Suggestion of diffuse osteopenia. IMPRESSION: Mildly impacted nondisplaced subcapital left femoral neck fracture. Suggestion of diffuse osteopenia. Electronically Signed   By: Delbert PhenixJason A Poff M.D.   On:  11/26/2015 17:42    EKG:  Ordered and pending   Assessment/Plan  1. Left hip fracture  - Suffered ground-level mechanical fall onto left hip  - Radiographs reveal subcapital femoral neck fx  - Orthopedics consulting and much appreciated  - Ice pack to hip; pain-control with fentanyl prn given reported intolerances to other agents  - Type and screen, EKG, CXR to be performed tonight  - Pt has previously tolerated general anesthesia without incident  - She is fairly active at baseline and ascends a flight of stairs without SOB or angina  - Holding ASA 81, last dose was 4/15   2. Hypertension  - At goal currently  - Managed at home with HCTZ   3. Hyperlipidemia  - Continue current-dose Pravachol 80 mg qHS    DVT ppx:  SQ Lovenox      Code Status: Full code Family Communication: None at bed side.               Disposition Plan: Admit to inpatient   Date of Service 11/27/2015    Briscoe Deutscherimothy S Opyd, MD [facility] Pager [phone]  If 7PM-7AM, please contact night-coverage [email or web address] Password [email or web address] 11/27/2015, 12:39 AM

## 2015-11-27 NOTE — H&P (View-Only) (Signed)
Reason for Consult:left femoral neck fx Referring Physician: Tyrell Antonio MD  [name] is an 70 y.o. female.  HPI: 70 yo female fell cleaning her pool with left hip pain and inability to ambulate. xrays femoral neck Fx.   Past Medical History  Diagnosis Date  . Hypertension   . High cholesterol     Past Surgical History  Procedure Laterality Date  . Tonsillectomy    . Abdominal hysterectomy    . Tubal ligation  1980  . Cholecystectomy N/A 01/21/2015    Procedure: LAPAROSCOPIC CHOLECYSTECTOMY;  Surgeon: Judeth Horn, MD;  Location: [facility];  Service: General;  Laterality: N/A;  . Laparoscopy N/A 04/15/2015    Procedure: LAPAROSCOPY DIAGNOSTIC ileocalpexy ;  Surgeon: Michael Boston, MD;  Location: [facility] OR[facility];  Service: General;  Laterality: N/A;  . Small intestine surgery Right     History reviewed. No pertinent family history.  Social History:  reports that she quit smoking about 3 years ago. She does not have any smokeless tobacco history on file. She reports that she drinks alcohol. She reports that she does not use illicit drugs.  Allergies:  Allergies  Allergen Reactions  . Avelox [Moxifloxacin] Swelling  . Penicillins Other (See Comments)    Reported whole-body skin peeling as a child   . Buprenorphine Hcl Nausea And Vomiting    vomiting  . Morphine And Related Other (See Comments)    vomiting  . Procaine Other (See Comments)    unknown  . Sulfa Antibiotics Other (See Comments)    Unknown  . Sulfacetamide Sodium Other (See Comments)    Unknown  . Aspirin Other (See Comments)    vomiting    Medications: I have reviewed the patient's current medications.  Results for orders placed or performed during the hospital encounter of 11/26/15 (from the past 48 hour(s))  CBC with Differential     Status: Abnormal   Collection Time: 11/26/15  6:05 PM  Result Value Ref Range   WBC 11.9 (H) 4.0 - 10.5 K/uL   RBC 4.24 3.87 - 5.11 MIL/uL   Hemoglobin 13.3 12.0 - 15.0 g/dL   HCT  38.5 36.0 - 46.0 %   MCV 90.8 78.0 - 100.0 fL   MCH 31.4 26.0 - 34.0 pg   MCHC 34.5 30.0 - 36.0 g/dL   RDW 11.9 11.5 - 15.5 %   Platelets 211 150 - 400 K/uL   Neutrophils Relative % 81 %   Neutro Abs 9.6 (H) 1.7 - 7.7 K/uL   Lymphocytes Relative 11 %   Lymphs Abs 1.3 0.7 - 4.0 K/uL   Monocytes Relative 7 %   Monocytes Absolute 0.9 0.1 - 1.0 K/uL   Eosinophils Relative 1 %   Eosinophils Absolute 0.1 0.0 - 0.7 K/uL   Basophils Relative 0 %   Basophils Absolute 0.0 0.0 - 0.1 K/uL  Basic metabolic panel     Status: None   Collection Time: 11/26/15  6:05 PM  Result Value Ref Range   Sodium 139 135 - 145 mmol/L   Potassium 3.5 3.5 - 5.1 mmol/L   Chloride 103 101 - 111 mmol/L   CO2 27 22 - 32 mmol/L   Glucose, Bld 99 65 - 99 mg/dL   BUN 15 6 - 20 mg/dL   Creatinine, Ser 0.80 0.44 - 1.00 mg/dL   Calcium 9.1 8.9 - 10.3 mg/dL   GFR calc non Af Amer >60 >60 mL/min   GFR calc Af Amer >60 >60 mL/min  Comment: (NOTE) The eGFR has been calculated using the CKD EPI equation. This calculation has not been validated in all clinical situations. eGFR's persistently <60 mL/min signify possible Chronic Kidney Disease.    Anion gap 9 5 - 15  Protime-INR     Status: None   Collection Time: 11/27/15  1:24 AM  Result Value Ref Range   Prothrombin Time 13.0 11.6 - 15.2 seconds   INR 0.96 0.00 - 1.49  Type and screen [facility]     Status: None   Collection Time: 11/27/15  1:24 AM  Result Value Ref Range   ABO/RH(D) A POS    Antibody Screen NEG    Sample Expiration 11/30/2015   Urinalysis, Routine w reflex microscopic (not at [facility])     Status: None   Collection Time: 11/27/15  4:25 AM  Result Value Ref Range   Color, Urine YELLOW YELLOW   APPearance CLEAR CLEAR   Specific Gravity, Urine 1.012 1.005 - 1.030   pH 7.0 5.0 - 8.0   Glucose, UA NEGATIVE NEGATIVE mg/dL   Hgb urine dipstick NEGATIVE NEGATIVE   Bilirubin Urine NEGATIVE NEGATIVE   Ketones, ur NEGATIVE  NEGATIVE mg/dL   Protein, ur NEGATIVE NEGATIVE mg/dL   Nitrite NEGATIVE NEGATIVE   Leukocytes, UA NEGATIVE NEGATIVE    Comment: MICROSCOPIC NOT DONE ON URINES WITH NEGATIVE PROTEIN, BLOOD, LEUKOCYTES, NITRITE, OR GLUCOSE <1000 mg/dL.    Dg Hip Unilat With Pelvis 2-3 Views Left  11/26/2015  CLINICAL DATA:  Left hip pain after fall EXAM: DG HIP (WITH OR WITHOUT PELVIS) 2-3V LEFT COMPARISON:  None. FINDINGS: Mildly impacted nondisplaced subcapital left femoral neck fracture. No dislocation or appreciable arthropathy in the left hip. No suspicious focal osseous lesion. Suggestion of diffuse osteopenia. IMPRESSION: Mildly impacted nondisplaced subcapital left femoral neck fracture. Suggestion of diffuse osteopenia. Electronically Signed   By: [name] M.D.   On: 11/26/2015 17:42    Review of Systems  Constitutional: Negative.   HENT:       Glasses  Eyes: Negative for pain and discharge.  Cardiovascular:       Pos for HTN  Gastrointestinal: Negative.   Genitourinary: Negative.   Skin: Negative.   Psychiatric/Behavioral: Negative.    Blood pressure 132/81, pulse 82, temperature 100.2 F (37.9 C), temperature source Oral, resp. rate 18, height _0  (1.575 m), weight 49.896 kg (110 lb), SpO2 94 %. Physical Exam  Constitutional: She appears well-developed and well-nourished.  HENT:  Head: Normocephalic.  Eyes: Pupils are equal, round, and reactive to light.  Neck: Normal range of motion.  Cardiovascular: Normal rate.   Respiratory: Effort normal.  GI: Soft. Bowel sounds are normal.  Musculoskeletal:  Pain with left hip ROM, pulses and sciatic function normal  Skin: Skin is warm and dry.    Assessment/Plan: Left femoral neck Fx, for ORIF stabilization.   Alnisa Hasley C 11/27/2015, 7:17 AM

## 2015-11-27 NOTE — Progress Notes (Signed)
Pt alert x4, spouse at the bedside, TX from the ED with belongings, Pt from home s/p fall skin dry and intact, no bruises noted. Will continue to monitor. Estill DoomsSimon, Ermin Parisien Mahario, RN 6:22 AM 11/27/2015

## 2015-11-27 NOTE — Consult Note (Signed)
Reason for Consult:left femoral neck fx Referring Physician: Regalado MD  [name] is an 70 y.o. female.  HPI: 70 yo female fell cleaning her pool with left hip pain and inability to ambulate. xrays femoral neck Fx.   Past Medical History  Diagnosis Date  . Hypertension   . High cholesterol     Past Surgical History  Procedure Laterality Date  . Tonsillectomy    . Abdominal hysterectomy    . Tubal ligation  1980  . Cholecystectomy N/A 01/21/2015    Procedure: LAPAROSCOPIC CHOLECYSTECTOMY;  Surgeon: James Wyatt, MD;  Location: [facility] OR;  Service: General;  Laterality: N/A;  . Laparoscopy N/A 04/15/2015    Procedure: LAPAROSCOPY DIAGNOSTIC ileocalpexy ;  Surgeon: Steven Gross, MD;  Location: [facility];  Service: General;  Laterality: N/A;  . Small intestine surgery Right     History reviewed. No pertinent family history.  Social History:  reports that she quit smoking about 3 years ago. She does not have any smokeless tobacco history on file. She reports that she drinks alcohol. She reports that she does not use illicit drugs.  Allergies:  Allergies  Allergen Reactions  . Avelox [Moxifloxacin] Swelling  . Penicillins Other (See Comments)    Reported whole-body skin peeling as a child   . Buprenorphine Hcl Nausea And Vomiting    vomiting  . Morphine And Related Other (See Comments)    vomiting  . Procaine Other (See Comments)    unknown  . Sulfa Antibiotics Other (See Comments)    Unknown  . Sulfacetamide Sodium Other (See Comments)    Unknown  . Aspirin Other (See Comments)    vomiting    Medications: I have reviewed the patient's current medications.  Results for orders placed or performed during the hospital encounter of 11/26/15 (from the past 48 hour(s))  CBC with Differential     Status: Abnormal   Collection Time: 11/26/15  6:05 PM  Result Value Ref Range   WBC 11.9 (H) 4.0 - 10.5 K/uL   RBC 4.24 3.87 - 5.11 MIL/uL   Hemoglobin 13.3 12.0 - 15.0 g/dL   HCT  38.5 36.0 - 46.0 %   MCV 90.8 78.0 - 100.0 fL   MCH 31.4 26.0 - 34.0 pg   MCHC 34.5 30.0 - 36.0 g/dL   RDW 11.9 11.5 - 15.5 %   Platelets 211 150 - 400 K/uL   Neutrophils Relative % 81 %   Neutro Abs 9.6 (H) 1.7 - 7.7 K/uL   Lymphocytes Relative 11 %   Lymphs Abs 1.3 0.7 - 4.0 K/uL   Monocytes Relative 7 %   Monocytes Absolute 0.9 0.1 - 1.0 K/uL   Eosinophils Relative 1 %   Eosinophils Absolute 0.1 0.0 - 0.7 K/uL   Basophils Relative 0 %   Basophils Absolute 0.0 0.0 - 0.1 K/uL  Basic metabolic panel     Status: None   Collection Time: 11/26/15  6:05 PM  Result Value Ref Range   Sodium 139 135 - 145 mmol/L   Potassium 3.5 3.5 - 5.1 mmol/L   Chloride 103 101 - 111 mmol/L   CO2 27 22 - 32 mmol/L   Glucose, Bld 99 65 - 99 mg/dL   BUN 15 6 - 20 mg/dL   Creatinine, Ser 0.80 0.44 - 1.00 mg/dL   Calcium 9.1 8.9 - 10.3 mg/dL   GFR calc non Af Amer >60 >60 mL/min   GFR calc Af Amer >60 >60 mL/min      Comment: (NOTE) The eGFR has been calculated using the CKD EPI equation. This calculation has not been validated in all clinical situations. eGFR's persistently <60 mL/min signify possible Chronic Kidney Disease.    Anion gap 9 5 - 15  Protime-INR     Status: None   Collection Time: 11/27/15  1:24 AM  Result Value Ref Range   Prothrombin Time 13.0 11.6 - 15.2 seconds   INR 0.96 0.00 - 1.49  Type and screen [facility]     Status: None   Collection Time: 11/27/15  1:24 AM  Result Value Ref Range   ABO/RH(D) A POS    Antibody Screen NEG    Sample Expiration 11/30/2015   Urinalysis, Routine w reflex microscopic (not at [facility])     Status: None   Collection Time: 11/27/15  4:25 AM  Result Value Ref Range   Color, Urine YELLOW YELLOW   APPearance CLEAR CLEAR   Specific Gravity, Urine 1.012 1.005 - 1.030   pH 7.0 5.0 - 8.0   Glucose, UA NEGATIVE NEGATIVE mg/dL   Hgb urine dipstick NEGATIVE NEGATIVE   Bilirubin Urine NEGATIVE NEGATIVE   Ketones, ur NEGATIVE  NEGATIVE mg/dL   Protein, ur NEGATIVE NEGATIVE mg/dL   Nitrite NEGATIVE NEGATIVE   Leukocytes, UA NEGATIVE NEGATIVE    Comment: MICROSCOPIC NOT DONE ON URINES WITH NEGATIVE PROTEIN, BLOOD, LEUKOCYTES, NITRITE, OR GLUCOSE <1000 mg/dL.    Dg Hip Unilat With Pelvis 2-3 Views Left  11/26/2015  CLINICAL DATA:  Left hip pain after fall EXAM: DG HIP (WITH OR WITHOUT PELVIS) 2-3V LEFT COMPARISON:  None. FINDINGS: Mildly impacted nondisplaced subcapital left femoral neck fracture. No dislocation or appreciable arthropathy in the left hip. No suspicious focal osseous lesion. Suggestion of diffuse osteopenia. IMPRESSION: Mildly impacted nondisplaced subcapital left femoral neck fracture. Suggestion of diffuse osteopenia. Electronically Signed   By: Jason A Poff M.D.   On: 11/26/2015 17:42    Review of Systems  Constitutional: Negative.   HENT:       Glasses  Eyes: Negative for pain and discharge.  Cardiovascular:       Pos for HTN  Gastrointestinal: Negative.   Genitourinary: Negative.   Skin: Negative.   Psychiatric/Behavioral: Negative.    Blood pressure 132/81, pulse 82, temperature 100.2 F (37.9 C), temperature source Oral, resp. rate 18, height 5' 2" (1.575 m), weight 49.896 kg (110 lb), SpO2 94 %. Physical Exam  Constitutional: She appears well-developed and well-nourished.  HENT:  Head: Normocephalic.  Eyes: Pupils are equal, round, and reactive to light.  Neck: Normal range of motion.  Cardiovascular: Normal rate.   Respiratory: Effort normal.  GI: Soft. Bowel sounds are normal.  Musculoskeletal:  Pain with left hip ROM, pulses and sciatic function normal  Skin: Skin is warm and dry.    Assessment/Plan: Left femoral neck Fx, for ORIF stabilization.   [name] 11/27/2015, 7:17 AM      

## 2015-11-27 NOTE — Anesthesia Procedure Notes (Signed)
Procedure Name: Intubation Date/Time: 11/27/2015 7:39 AM Performed by: Wynonia SoursWALKER, Chrissie Dacquisto L Pre-anesthesia Checklist: Patient identified, Emergency Drugs available, Suction available, Patient being monitored and Timeout performed Patient Re-evaluated:Patient Re-evaluated prior to inductionOxygen Delivery Method: Circle system utilized Preoxygenation: Pre-oxygenation with 100% oxygen Intubation Type: IV induction Ventilation: Mask ventilation without difficulty Laryngoscope Size: Mac and 4 Grade View: Grade I Tube type: Oral Tube size: 7.0 mm Number of attempts: 1 Airway Equipment and Method: Stylet Placement Confirmation: ETT inserted through vocal cords under direct vision,  positive ETCO2,  CO2 detector and breath sounds checked- equal and bilateral Secured at: 20 cm Tube secured with: Tape Dental Injury: Teeth and Oropharynx as per pre-operative assessment

## 2015-11-27 NOTE — Progress Notes (Signed)
Utilization review completed.  

## 2015-11-27 NOTE — Brief Op Note (Signed)
11/26/2015 - 11/27/2015  8:24 AM  PATIENT:  Wilford SportsLouellen H Toscano  70 y.o. female  PRE-OPERATIVE DIAGNOSIS:  left intertrochanteric hip fracture  POST-OPERATIVE DIAGNOSIS:  left intertrochanteric hip fracture  PROCEDURE:  Procedure(s): INTRAMEDULLARY (IM) NAIL INTERTROCHANTRIC (Left)   ( ORIF left femoral neck Fracture )  SURGEON:  Surgeon(s) and Role:    * Eldred MangesMark C Nikya Busler, MD - Primary  PHYSICIAN ASSISTANT:   ASSISTANTS: none   ANESTHESIA:   local and general  EBL:     BLOOD ADMINISTERED:none  DRAINS: none   LOCAL MEDICATIONS USED:  MARCAINE     SPECIMEN:  No Specimen  DISPOSITION OF SPECIMEN:  N/A  COUNTS:  YES  TOURNIQUET:  * No tourniquets in log *  DICTATION: .Other Dictation: Dictation Number [id number]  PLAN OF CARE: already inpt  PATIENT DISPOSITION:  PACU - hemodynamically stable.   Delay start of Pharmacological VTE agent (>24hrs) due to surgical blood loss or risk of bleeding: yes

## 2015-11-28 ENCOUNTER — Encounter (HOSPITAL_COMMUNITY): Payer: Self-pay | Admitting: Orthopaedic Surgery

## 2015-11-28 DIAGNOSIS — E7800 Pure hypercholesterolemia, unspecified: Secondary | ICD-10-CM

## 2015-11-28 DIAGNOSIS — I10 Essential (primary) hypertension: Secondary | ICD-10-CM

## 2015-11-28 LAB — CBC
HEMATOCRIT: 35.1 % — AB (ref 36.0–46.0)
Hemoglobin: 11.9 g/dL — ABNORMAL LOW (ref 12.0–15.0)
MCH: 30.9 pg (ref 26.0–34.0)
MCHC: 33.9 g/dL (ref 30.0–36.0)
MCV: 91.2 fL (ref 78.0–100.0)
Platelets: 172 10*3/uL (ref 150–400)
RBC: 3.85 MIL/uL — AB (ref 3.87–5.11)
RDW: 12.6 % (ref 11.5–15.5)
WBC: 8 10*3/uL (ref 4.0–10.5)

## 2015-11-28 LAB — URINE CULTURE

## 2015-11-28 LAB — BASIC METABOLIC PANEL
ANION GAP: 6 (ref 5–15)
BUN: 10 mg/dL (ref 6–20)
CO2: 30 mmol/L (ref 22–32)
Calcium: 8.4 mg/dL — ABNORMAL LOW (ref 8.9–10.3)
Chloride: 99 mmol/L — ABNORMAL LOW (ref 101–111)
Creatinine, Ser: 0.82 mg/dL (ref 0.44–1.00)
GFR calc Af Amer: >60 mL/min (ref >60)
GFR calc non Af Amer: >60 mL/min (ref >60)
GLUCOSE: 134 mg/dL — AB (ref 65–99)
POTASSIUM: 3.7 mmol/L (ref 3.5–5.1)
Sodium: 135 mmol/L (ref 135–145)

## 2015-11-28 MED ORDER — POLYETHYLENE GLYCOL 3350 17 G PO PACK: 17.0000 g | PACK | Freq: Every day | ORAL | Status: DC

## 2015-11-28 MED ORDER — ASPIRIN 325 MG PO TABS: 325.0000 mg | ORAL_TABLET | Freq: Every day | ORAL | Status: DC

## 2015-11-28 MED ORDER — OXYCODONE-ACETAMINOPHEN 5-325 MG PO TABS
1.0000 | ORAL_TABLET | ORAL | Status: DC | PRN
  Administered 2015-11-28 – 2015-11-29 (×4): 1 via ORAL
  Filled 2015-11-28: qty 1
  Filled 2015-11-28: qty 2
  Filled 2015-11-28 (×2): qty 1

## 2015-11-28 MED ORDER — OXYCODONE-ACETAMINOPHEN 5-325 MG PO TABS: 1.0000 | ORAL_TABLET | ORAL | Status: DC | PRN

## 2015-11-28 NOTE — Progress Notes (Signed)
Initial Nutrition Assessment  DOCUMENTATION CODES:   Not applicable  INTERVENTION:  - Continue Regular diet - RD will continue to monitor for additional nutrition-related needs  NUTRITION DIAGNOSIS:   Inadequate oral intake related to nausea, vomiting as evidenced by per patient/family report, meal completion < 25%.  GOAL:   Patient will meet greater than or equal to 90% of their needs  MONITOR:   PO intake, Weight trends, Labs, I & O's  REASON FOR ASSESSMENT:   Consult Hip fracture protocol  ASSESSMENT:   70 y.o. female with PMH of hypertension and hyperlipidemia who presents to the ED with severe acute left hip pain following a ground-level mechanical fall at home. Patient had been in her usual state of fairly good health until just prior to her presentation when she slipped while cleaning her swimming pool, falling hard onto the left hip. She experienced immediate pain and difficulty ambulating and EMS was activated for transport to the hospital. She denies any head strike or loss of consciousness and does not take anticoagulants. She does take a daily 81 mg aspirin. She is a former smoker but denies any underlying cardiopulmonary disease.   Pt seen for consult. BMI indicates normal weight. Per chart review, pt consumed 100% of lunch and 75% of dinner 4/16 and 10% of breakfast this AM. Pt actively vomiting at time of RD visit and RN in the room to attend to the same. Unable to obtain information from pt at this time. Pt admitted with L hip fx s/p fall. POD #1 intramedullary nail intertrochantric to L hip.  Unable to complete physical assessment at this time. Per chart review, pt has gained 8 lbs since 04/15/15 with stable weight of 102-110 lbs since 01/16/15. Will complete physical assessment at follow-up and document accordingly.  Pt meeting needs during hospitalization prior to this AM. Will continue to monitor PO intakes, episodes of N/V, and nutrition-related needs. Medications  reviewed; PRN Reglan and PRN Zofran. IVF: 1/2 NS @ 75 mL/hr. Labs reviewed; Cl: 99 mmol/L, Ca: 8.4 mg/dL.   Diet Order:  Diet regular Room service appropriate?: Yes; Fluid consistency:: Thin  Skin:  Wound (see comment) (L hip incision from 11/27/15)  Last BM:  4/14  Height:   Ht Readings from Last 1 Encounters:  11/27/15 5\' 2"  (1.575 m)    Weight:   Wt Readings from Last 1 Encounters:  11/27/15 110 lb (49.896 kg)    Ideal Body Weight:  50 kg (kg)  BMI:  Body mass index is 20.11 kg/(m^2).  Estimated Nutritional Needs:   Kcal:  1250-1500 (25-30 kcal/kg)  Protein:  60-70 grams (1.2-1.4 grams)  Fluid:  >/= 1.5 L/day  EDUCATION NEEDS:   No education needs identified at this time     Trenton GammonJessica Castin Donaghue, RD, LDN Inpatient Clinical Dietitian Pager # [phone] After hours/weekend pager # [phone]

## 2015-11-28 NOTE — Evaluation (Signed)
Occupational Therapy Evaluation Patient Details Name: [name] MRN: [id number] DOB: 1946/03/16 Today's Date: 11/28/2015    History of Present Illness Radiographs reveal subcapital femoral neck fx 11/26/15,S/p ORIF 11/27/15   Clinical Impression   Pt admitted with hip fx. Pt currently with functional limitations due to the deficits listed below (see OT Problem List).  Pt will benefit from skilled OT to increase their safety and independence with ADL and functional mobility for ADL to facilitate discharge to venue listed below.      Follow Up Recommendations  No OT follow up    Equipment Recommendations    tub bench? Pt may have one      Precautions / Restrictions Precautions Precautions: Fall Restrictions LLE Weight Bearing: Partial weight bearing LLE Partial Weight Bearing Percentage or Pounds: 50%      Mobility Bed Mobility Overal bed mobility: Needs Assistance Bed Mobility: Supine to Sit     Supine to sit: Min assist     General bed mobility comments: in recliner  Transfers Overall transfer level: Needs assistance Equipment used: Rolling walker (2 wheeled) Transfers: Sit to/from [employer] Sit to Stand: Min assist Stand pivot transfers: Min assist       General transfer comment: cues for hand placement         ADL Overall ADL's : Needs assistance/impaired     Grooming: Set up;Sitting   Upper Body Bathing: Set up;Sitting   Lower Body Bathing: Moderate assistance;Sit to/from stand;Cueing for safety;Cueing for sequencing   Upper Body Dressing : Set up;Sitting   Lower Body Dressing: Moderate assistance;Sit to/from stand;Cueing for sequencing;Cueing for safety   Toilet Transfer: Minimal assistance;RW;Regular Toilet;Comfort height toilet;Ambulation   Toileting- Clothing Manipulation and Hygiene: Minimal assistance;Sit to/from stand;Cueing for safety         General ADL Comments: cueing for [facility]                Pertinent Vitals/Pain Pain Assessment: 0-10 Pain Score: 2  Pain Location: L hip Pain Descriptors / Indicators: Sore Pain Intervention(s): Monitored during session     Hand Dominance     Extremity/Trunk Assessment Upper Extremity Assessment Upper Extremity Assessment: Overall WFL for tasks assessed   Lower Extremity Assessment Lower Extremity Assessment: RLE deficits/detail RLE Deficits / Details: advances the leg.       Communication Communication Communication: No difficulties   Cognition Arousal/Alertness: Awake/alert Behavior During Therapy: WFL for tasks assessed/performed Overall Cognitive Status: Within Functional Limits for tasks assessed                                Home Living Family/patient expects to be discharged to:: Private residence   Available Help at Discharge: Family Type of Home: House Home Access: Stairs to enter Secretary/administrator of Steps: 2 Entrance Stairs-Rails: Right Home Layout: One level     Bathroom Shower/Tub: Chief Strategy Officer: Standard     Home Equipment: Bedside commode;Crutches;Shower seat          Prior Functioning/Environment Level of Independence: Independent             OT Diagnosis: Generalized weakness   OT Problem List: Decreased strength   OT Treatment/Interventions: Self-care/ADL training;Patient/family education    OT Goals(Current goals can be found in the care plan section) Acute Rehab OT Goals Patient Stated Goal: to go home today OT Goal Formulation: With patient Time For Goal Achievement: 11/28/15  OT Frequency: Min  2X/week              End of Session Nurse Communication: Mobility status  Activity Tolerance: Patient tolerated treatment well Patient left: in chair;with chair alarm set   Time: 1319-1330 OT Time Calculation (min): 11 min Charges:  OT General Charges $OT Visit: 1 Procedure OT Evaluation $OT Eval Low Complexity: 1 Procedure G-Codes:     [name], [name] 11/28/2015, 1:53 PM

## 2015-11-28 NOTE — Evaluation (Signed)
Physical Therapy Evaluation Patient Details Name: [name] MRN: [id number] DOB: 08/30/1945 Today's Date: 11/28/2015   History of Present Illness  Radiographs reveal subcapital femoral neck fx 11/26/15,S/p ORIF 11/27/15  Clinical Impression  The patient is mobiliZing well, PWB. N/V sweaty after return to room. The patient will benefit from PT to address the problems listed in the note below.    Follow Up Recommendations Home health PT;Supervision - Intermittent    Equipment Recommendations  Rolling walker with 5" wheels (YOUTH.)    Recommendations for Other Services       Precautions / Restrictions Precautions Precautions: Fall Restrictions Weight Bearing Restrictions: Yes LLE Weight Bearing: Partial weight bearing LLE Partial Weight Bearing Percentage or Pounds: 50%      Mobility  Bed Mobility               General bed mobility comments: in recliner  Transfers Overall transfer level: Needs assistance Equipment used: Rolling walker (2 wheeled) Transfers: Sit to/from Stand Sit to Stand: Min assist         General transfer comment: cues for hand placement  Ambulation/Gait Ambulation/Gait assistance: Min assist Ambulation Distance (Feet): 120 Feet Assistive device: Rolling walker (2 wheeled) Gait Pattern/deviations: Step-to pattern;Antalgic     General Gait Details: cues  for partial WB.  Stairs            Wheelchair Mobility    Modified Rankin (Stroke Patients Only)       Balance                                             Pertinent Vitals/Pain Pain Assessment: 0-10 Pain Score: 2  Pain Location: L hip Pain Descriptors / Indicators: Discomfort;Tightness Pain Intervention(s): Monitored during session;Premedicated before session;Ice applied    Home Living Family/patient expects to be discharged to:: Private residence   Available Help at Discharge: Family Type of Home: House Home Access: Stairs to  enter Entrance Stairs-Rails: Right Entrance Stairs-Number of Steps: 2 Home Layout: One level Home Equipment: Bedside commode;Crutches      Prior Function Level of Independence: Independent               Hand Dominance        Extremity/Trunk Assessment   Upper Extremity Assessment: Defer to OT evaluation           Lower Extremity Assessment: RLE deficits/detail RLE Deficits / Details: advances the leg.       Communication      Cognition Arousal/Alertness: Awake/alert Behavior During Therapy: WFL for tasks assessed/performed Overall Cognitive Status: Within Functional Limits for tasks assessed                      General Comments      Exercises        Assessment/Plan    PT Assessment Patient needs continued PT services  PT Diagnosis Difficulty walking   PT Problem List Decreased strength;Decreased range of motion;Decreased activity tolerance;Decreased mobility;Decreased knowledge of precautions;Decreased knowledge of use of DME;Decreased safety awareness  PT Treatment Interventions DME instruction;Gait training;Stair training;Functional mobility training;Therapeutic activities;Therapeutic exercise;Patient/family education   PT Goals (Current goals can be found in the Care Plan section) Acute Rehab PT Goals Patient Stated Goal: to go home today PT Goal Formulation: With patient Time For Goal Achievement: 12/03/15 Potential to Achieve Goals: Good    Frequency Min  6X/week   Barriers to discharge        Co-evaluation               End of Session Equipment Utilized During Treatment: Gait belt Activity Tolerance: Patient tolerated treatment well Patient left: in chair;with call bell/phone within reach;with chair alarm set Nurse Communication: Mobility status (N/V after return to room.)         Time: 1006-1036 PT Time Calculation (min) (ACUTE ONLY): 30 min   Charges:   PT Evaluation $PT Eval Low Complexity: 1 Procedure PT  Treatments $Gait Training: 8-22 mins   PT G Codes:        Sharen HeckHill, Dhanush Jokerst Elizabeth Alaiza Yau PT [phone]  11/28/2015, 10:55 AM

## 2015-11-28 NOTE — Care Management Note (Signed)
Case Management Note  Patient Details  Name: [name] MRN: [id number] Date of Birth: 05-Nov-1945  Subjective/Objective:                  70  Yo admitted with left hip fx  Action/Plan: From home with spouse  Expected Discharge Date:  12/06/15               Expected Discharge Plan:  [place]  In-House Referral:     Discharge planning Services  CM Consult  Post Acute Care Choice:  Home Health Choice offered to:  Patient  DME Arranged:  Walker rolling (youth) DME Agency:  [place]:  PT [place] Agency:  [place]  Status of Service:  Completed, signed off  Medicare Important Message Given:    Date Medicare IM Given:    Medicare IM give by:    Date Additional Medicare IM Given:    Additional Medicare Important Message give by:     If discussed at [place] of Stay Meetings, dates discussed:    Additional Comments: This CM met with pt and went over PT recommendations.  Pt offered choice and chose [facility] for HHPT.  Pt states she has a tub bench at home and will need a youth rolling walker.  RW order placed and [facility] DME rep contacted for referral.  Will need HHPT order from MD.  [facility] rep contacted for referral for HHPT.  No other CM needs communicated. Lynnell Catalan, RN 11/28/2015, 4:00 PM [phone]

## 2015-11-28 NOTE — Progress Notes (Addendum)
[facility] PROGRESS NOTE  Lin GivensLouellen H Amon WUJ:[id number] DOB: 1945-09-07 DOA: 11/26/2015 PCP: Agustina CaroliHICKS, KRISTIN D, MD  Assessment/Plan: 1. Left hip fracture  - Suffered ground-level mechanical fall onto left hip  - Radiographs reveal subcapital femoral neck fx  -resume aspirin when ok by ortho  -S/P Intramedullary nail left intertrochanteric 4-16. -pt per ortho.  -Bowel regimen.   2. Hypertension  -Hold HCTZ post op. SBP in the 120   3. Hyperlipidemia  - Continue current-dose Pravachol 80 mg qHS   4-mild fever; suspect post op, atelectasis. Incentive spirometry.   DVT ppx: SQ Lovenox   Code Status: Full Code.  Family Communication: care discussed with patient.  Disposition Plan: remain inpatient.    Consultants:  Dr Ophelia Charteryates   Procedures: ORIF left femoral neck Fracture   Antibiotics: none HPI/Subjective: Walking with PT.  Vomit this am, small amount. Was able to urinate after foley removed.  No BM yet.    Objective: Filed Vitals:   11/28/15 0434 11/28/15 0800  BP: 118/69 125/84  Pulse: 86 81  Temp: 99.9 F (37.7 C) 98.2 F (36.8 C)  Resp:  18    Intake/Output Summary (Last 24 hours) at 11/28/15 1328 Last data filed at 11/28/15 0900  Gross per 24 hour  Intake    960 ml  Output   3425 ml  Net  -2465 ml   Filed Weights   11/26/15 1654 11/26/15 2339 11/27/15 0940  Weight: 49.896 kg (110 lb) 49.896 kg (110 lb) 49.896 kg (110 lb)    Exam:   General:  Alert in no distress  Cardiovascular: S 1, S 2 RRR  Respiratory: CTA  Abdomen: BS present , soft.   Musculoskeletal: dressing left hip   Data Reviewed: Basic Metabolic Panel:  Recent Labs Lab 11/26/15 1805 11/27/15 1019 11/28/15 0401  NA 139  --  135  K 3.5  --  3.7  CL 103  --  99*  CO2 27  --  30  GLUCOSE 99  --  134*  BUN 15  --  10  CREATININE 0.80 0.79 0.82  CALCIUM 9.1  --  8.4*   Liver Function Tests: No results for input(s): AST, ALT, ALKPHOS, BILITOT,  PROT, ALBUMIN in the last 168 hours. No results for input(s): LIPASE, AMYLASE in the last 168 hours. No results for input(s): AMMONIA in the last 168 hours. CBC:  Recent Labs Lab 11/26/15 1805 11/27/15 1019 11/28/15 0401  WBC 11.9* 10.9* 8.0  NEUTROABS 9.6*  --   --   HGB 13.3 12.3 11.9*  HCT 38.5 35.6* 35.1*  MCV 90.8 89.0 91.2  PLT 211 191 172   Cardiac Enzymes: No results for input(s): CKTOTAL, CKMB, CKMBINDEX, TROPONINI in the last 168 hours. BNP (last 3 results) No results for input(s): BNP in the last 8760 hours.  ProBNP (last 3 results) No results for input(s): PROBNP in the last 8760 hours.  CBG: No results for input(s): GLUCAP in the last 168 hours.  Recent Results (from the past 240 hour(s))  MRSA PCR Screening     Status: None   Collection Time: 11/27/15  5:32 AM  Result Value Ref Range Status   MRSA by PCR NEGATIVE NEGATIVE Final    Comment:        The GeneXpert MRSA Assay (FDA approved for NASAL specimens only), is one component of a comprehensive MRSA colonization surveillance program. It is not intended to diagnose MRSA infection nor to guide or monitor treatment for MRSA infections.  Studies: Chest Portable 1 View  11/27/2015  CLINICAL DATA:  Preoperative examination prior to orthopedic surgery. Ex-smoker. History of hypertension. EXAM: PORTABLE CHEST 1 VIEW COMPARISON:  None. FINDINGS: Borderline enlarged cardiac silhouette. Tortuous and partially calcified thoracic aorta. Diffusely prominent interstitial markings. Mild scoliosis. IMPRESSION: Borderline cardiomegaly and mild to moderate chronic interstitial lung disease. Electronically Signed   By: Beckie Salts M.D.   On: 11/27/2015 07:31   Dg C-arm 1-60 Min-no Report  11/27/2015  CLINICAL DATA: Left hip IM nail C-ARM 1-60 MINUTES Fluoroscopy was utilized by the requesting physician.  No radiographic interpretation.   Dg Hip Operative Unilat W Or W/o Pelvis Left  11/27/2015  CLINICAL DATA:   70 year old female with a history of fractured hip EXAM: DG C-ARM 1-60 MIN-NO REPORT; OPERATIVE LEFT HIP WITH PELVIS FLUOROSCOPY TIME:  Please refer to the dictated operative report for full details of intraoperative findings and procedure. COMPARISON:  11/26/2015 FINDINGS: Intraoperative fluoroscopic spot images. Surgical changes of antegrade left femoral intra medullary rod placement with femoral neck screw fixation for repair of left femoral neck fracture. Single distal interlocking screw. No complicating features. IMPRESSION: Intraoperative fluoroscopic spot images of the left hip ORIF, as above, with no complicating features. Please refer to the dictated operative report for full details of intraoperative findings and procedure. Signed, Yvone Neu. Loreta Ave, DO [facility] Electronically Signed   By: Gilmer Mor D.O.   On: 11/27/2015 10:17   Dg Hip Unilat With Pelvis 2-3 Views Left  11/26/2015  CLINICAL DATA:  Left hip pain after fall EXAM: DG HIP (WITH OR WITHOUT PELVIS) 2-3V LEFT COMPARISON:  None. FINDINGS: Mildly impacted nondisplaced subcapital left femoral neck fracture. No dislocation or appreciable arthropathy in the left hip. No suspicious focal osseous lesion. Suggestion of diffuse osteopenia. IMPRESSION: Mildly impacted nondisplaced subcapital left femoral neck fracture. Suggestion of diffuse osteopenia. Electronically Signed   By: Delbert Phenix M.D.   On: 11/26/2015 17:42    Scheduled Meds: . docusate sodium  100 mg Oral BID  . enoxaparin (LOVENOX) injection  40 mg Subcutaneous Q24H  . omega-3 acid ethyl esters  1 g Oral Daily  . pravastatin  80 mg Oral q1800   Continuous Infusions:    Principal Problem:   Closed left hip fracture (HCC) Active Problems:   Essential (primary) hypertension   Hypercholesteremia   Hip fracture (HCC)   Preop testing    Time spent: 25 minutes.     Hartley Barefoot A  [facility] Pager [phone]. If 7PM-7AM, please contact night-coverage at [email or web address], password [facility] 11/28/2015, 1:28 PM  LOS: 2 days

## 2015-11-28 NOTE — Op Note (Signed)
NAMDelbert Harness:  Summers, [name]             ACCOUNT NO.:  [id number]  MEDICAL RECORD NO.:  [id number]  LOCATION:  [facility]                         [facility]  PHYSICIAN:  Xzavier Swinger C. Ophelia CharterYates, M.D.    DATE OF BIRTH:  10-12-1945  DATE OF PROCEDURE:  11/27/2015 DATE OF DISCHARGE:                              OPERATIVE REPORT   PREOPERATIVE DIAGNOSIS:  Minimally displaced left femoral neck fracture.  POSTOPERATIVE DIAGNOSIS:  Minimally displaced left femoral neck fracture.  PROCEDURE:  Open reduction and internal fixation of left femoral neck with trochanteric compression screw Biomet Affixus Short with interlocks and derotation screw.  SURGEON:  Nivan Melendrez C. Ophelia CharterYates, M.D.  ANESTHESIA:  General.  ESTIMATED BLOOD LOSS:  100 mL.  DRAINS:  None.  DESCRIPTION OF PROCEDURE:  After induction of general anesthesia, the patient was placed on the fracture table using the [facility] boot securement of the foot.  Foley catheter was placed.  A time-out procedure, Ancef prophylaxis, reduction with internal rotation.  No significant traction. When she was in anatomic position, AP and lateral.  Prepping with DuraPrep, squared with towels.  Large shower curtain.  Drape was applied.  Incision was made proximal to trochanter, gluteus medius, fascia was split.  Tip of the trochanter was palpated.  A pin was placed checked under fluoro, drilled over reamed 11 x 130 nail was passed and then under C-arm visualization, placement of a pin low in the neck on AP, slightly posterior on lateral.  Derotation pin was placed.  Lower pin was reamed.  Screw was locked into place.  Derotation pin was removed and the __________ proximal derotation screw was placed as well. Distal interlock was done static, locked in with a 38 mm screw which was slightly long than the small female.  Alignment looked good.  AP lateral final spot pictures were taken.  Irrigation with saline solution. Closure of the fascia 0 Vicryl, 2-0 Vicryl subcutaneous  tissue and skin staple closure.  Marcaine infiltration in the skin.  Postop dressing and transferred to recovery room.     Brodi Kari C. Ophelia CharterYates, M.D.     MCY/MEDQ  D:  11/27/2015  T:  11/27/2015  Job:  [id number]

## 2015-11-29 LAB — BASIC METABOLIC PANEL
ANION GAP: 9 (ref 5–15)
BUN: 6 mg/dL (ref 6–20)
CALCIUM: 8.7 mg/dL — AB (ref 8.9–10.3)
CO2: 29 mmol/L (ref 22–32)
CREATININE: 0.63 mg/dL (ref 0.44–1.00)
Chloride: 97 mmol/L — ABNORMAL LOW (ref 101–111)
GFR calc Af Amer: >60 mL/min (ref >60)
GFR calc non Af Amer: >60 mL/min (ref >60)
GLUCOSE: 154 mg/dL — AB (ref 65–99)
Potassium: 3.5 mmol/L (ref 3.5–5.1)
Sodium: 135 mmol/L (ref 135–145)

## 2015-11-29 LAB — CBC
HEMATOCRIT: 31.9 % — AB (ref 36.0–46.0)
Hemoglobin: 10.6 g/dL — ABNORMAL LOW (ref 12.0–15.0)
MCH: 29.4 pg (ref 26.0–34.0)
MCHC: 33.2 g/dL (ref 30.0–36.0)
MCV: 88.6 fL (ref 78.0–100.0)
PLATELETS: 158 10*3/uL (ref 150–400)
RBC: 3.6 MIL/uL — ABNORMAL LOW (ref 3.87–5.11)
RDW: 12.2 % (ref 11.5–15.5)
WBC: 7.9 10*3/uL (ref 4.0–10.5)

## 2015-11-29 MED ORDER — NITROFURANTOIN MONOHYD MACRO 100 MG PO CAPS
100.0000 mg | ORAL_CAPSULE | Freq: Two times a day (BID) | ORAL | Status: DC
  Administered 2015-11-29: 100 mg via ORAL
  Filled 2015-11-29 (×2): qty 1

## 2015-11-29 MED ORDER — BISACODYL 10 MG RE SUPP: 10.0000 mg | Freq: Every day | RECTAL | Status: DC | PRN

## 2015-11-29 MED ORDER — SENNA 8.6 MG PO TABS
1.0000 | ORAL_TABLET | Freq: Every day | ORAL | Status: DC
  Administered 2015-11-29: 8.6 mg via ORAL

## 2015-11-29 MED ORDER — NITROFURANTOIN MONOHYD MACRO 100 MG PO CAPS: 100.0000 mg | ORAL_CAPSULE | Freq: Two times a day (BID) | ORAL | Status: DC

## 2015-11-29 MED ORDER — SENNA 8.6 MG PO TABS: 1.0000 | ORAL_TABLET | Freq: Every day | ORAL | Status: DC

## 2015-11-29 MED ORDER — DOCUSATE SODIUM 100 MG PO CAPS: 100.0000 mg | ORAL_CAPSULE | Freq: Two times a day (BID) | ORAL | Status: DC

## 2015-11-29 NOTE — Progress Notes (Signed)
Occupational Therapy Treatment Patient Details Name: Lynn GivensLouellen H Summers MRN: [id number] DOB: 11/05/1945 Today's Date: 11/29/2015    History of present illness Radiographs reveal subcapital femoral neck fx 11/26/15,S/p ORIF 11/27/15   OT comments  Patient making good progress towards OT goals, continue plan of care for now. Pt with decreased RW safety awareness, educated pt on how to safely and effectively use RW to increase independence and decrease risk of falls. See under ADL comment for more information regarding this OT treatment session.    Follow Up Recommendations  No OT follow up;Supervision - Intermittent    Equipment Recommendations  Tub/shower bench (IF patient doesn't have one)    Recommendations for Other Services  None at this time   Precautions / Restrictions Precautions Precautions: Fall Restrictions Weight Bearing Restrictions: Yes LLE Weight Bearing: Partial weight bearing LLE Partial Weight Bearing Percentage or Pounds: 50    Mobility Bed Mobility Overal bed mobility: Needs Assistance Bed Mobility: Supine to Sit     Supine to sit: Supervision     General bed mobility comments: Supervision for safety, HOB down, minimal use of bed rails   Transfers Overall transfer level: Needs assistance Equipment used: Rolling walker (2 wheeled) Transfers: Sit to/from Stand Sit to Stand: Supervision General transfer comment: Supervision for safety, cueing for safety    Balance Overall balance assessment: Needs assistance Sitting-balance support: No upper extremity supported;Feet supported Sitting balance-Leahy Scale: Good     Standing balance support: Bilateral upper extremity supported;During functional activity Standing balance-Leahy Scale: Good Standing balance comment: Heavy reliance on RW    ADL Overall ADL's : Needs assistance/impaired Eating/Feeding: Set up;Sitting   Grooming: Set up;Standing Grooming Details (indicate cue type and reason): at  sink Upper Body Bathing: Set up;Sitting   Lower Body Bathing: Supervison/ safety;Sit to/from stand;Cueing for back precautions;With adaptive equipment;Cueing for safety   Upper Body Dressing : Set up;Sitting   Lower Body Dressing: Supervision/safety;Sit to/from stand;Cueing for back precautions;Cueing for safety;With adaptive equipment   Toilet Transfer: Supervision/safety;RW;BSC;Ambulation Toilet Transfer Details (indicate cue type and reason): Cues for safety with RW, pt tends to want to push RW out of the way and take steps holding onto grab bars and BSC.  Toileting- Clothing Manipulation and Hygiene: Supervision/safety;Sitting/lateral lean   Tub/ Engineer, structuralhower Transfer: Supervision/safety;Ambulation;3 in 1;Rolling walker;Tub transfer   Functional mobility during ADLs: Supervision/safety;Cueing for safety;Rolling walker General ADL Comments: Reiterated importance of RW and safety with RW. Pt used AE to increase independence with LB ADLs. Pt practiced simulated tub/shower transfer using drop arm 3-n-1, pt states she may have a tub transfer bench at home to use.      Cognition   Behavior During Therapy: WFL for tasks assessed/performed Overall Cognitive Status: Within Functional Limits for tasks assessed                 Pertinent Vitals/ Pain       Pain Assessment: 0-10 Pain Score: 2  Pain Location: LLE Pain Descriptors / Indicators: Sore Pain Intervention(s): Monitored during session;Repositioned   Frequency Min 2X/week     Progress Toward Goals  OT Goals(current goals can now befound in the care plan section)  Progress towards OT goals: Progressing toward goals  Acute Rehab OT Goals Patient Stated Goal: to go home today OT Goal Formulation: With patient Time For Goal Achievement: 12/05/15 (error entered during evaluation)  Plan Discharge plan remains appropriate    End of Session Equipment Utilized During Treatment: Rolling walker   Activity Tolerance Patient  tolerated treatment  well   Patient Left in chair;with chair alarm set;with call bell/phone within reach    Time: 0913-0931 OT Time Calculation (min): 18 min  Charges: OT General Charges $OT Visit: 1 Procedure OT Treatments $Self Care/Home Management : 8-22 mins  Edwin Cap , MS, OTR/L, CLT Pager: [phone]  11/29/2015, 9:48 AM

## 2015-11-29 NOTE — Care Management Important Message (Signed)
Important Message  Patient Details  Name: Lin GivensLouellen H Honea MRN: [id number] Date of Birth: 1946-03-23   Medicare Important Message Given:  Yes    Haskell FlirtJamison, Quintavia Rogstad 11/29/2015, 10:45 AMImportant Message  Patient Details  Name: Lin GivensLouellen H Motter MRN: [id number] Date of Birth: 1946-03-23   Medicare Important Message Given:  Yes    Haskell FlirtJamison, Lorenza Shakir 11/29/2015, 10:45 AM

## 2015-11-29 NOTE — Progress Notes (Signed)
[facility]    [facility] providing the [facility]:  RW  If patient discharges after hours, please call [phone].   Renard HamperLecretia Williamson 11/29/2015, 12:09 PM

## 2015-11-29 NOTE — Progress Notes (Signed)
Physical Therapy Treatment Patient Details Name: Lynn GivensLouellen H Summers MRN: [id number] DOB: October 07, 1945 Today's Date: 11/29/2015    History of Present Illness Radiographs reveal subcapital femoral neck fx 11/26/15,S/p ORIF 11/27/15    PT Comments    POD # 3 Assisted with amb and practiced stairs twice.  Performed TE's following HEP.    Follow Up Recommendations  Home health PT;Supervision - Intermittent     Equipment Recommendations  Rolling walker with 5" wheels    Recommendations for Other Services       Precautions / Restrictions Precautions Precautions: Fall Restrictions Weight Bearing Restrictions: Yes LLE Weight Bearing: Partial weight bearing LLE Partial Weight Bearing Percentage or Pounds: 50    Mobility  Bed Mobility Overal bed mobility: Needs Assistance Bed Mobility: Supine to Sit     Supine to sit: Supervision     General bed mobility comments: Pt OOB in recliner  Transfers Overall transfer level: Needs assistance Equipment used: Rolling walker (2 wheeled) Transfers: Sit to/from Stand Sit to Stand: Supervision         General transfer comment: Supervision for safety, cueing for safety  Ambulation/Gait Ambulation/Gait assistance: Supervision;Min guard Ambulation Distance (Feet): 125 Feet Assistive device: Rolling walker (2 wheeled) Gait Pattern/deviations: Step-to pattern;Decreased stance time - left     General Gait Details: cues  for partial WB.   Stairs Stairs: Yes Stairs assistance: Min guard Stair Management: One rail Right;Step to pattern;Forwards;With crutches Number of Stairs: 2 General stair comments: performed twice using one R rail and one L crutch.  Instructed on proper tech and PWB.   Wheelchair Mobility    Modified Rankin (Stroke Patients Only)       Balance Overall balance assessment: Needs assistance Sitting-balance support: No upper extremity supported;Feet supported Sitting balance-Leahy Scale: Good     Standing  balance support: Bilateral upper extremity supported;During functional activity Standing balance-Leahy Scale: Good Standing balance comment: Heavy reliance on RW                     Cognition Arousal/Alertness: Awake/alert Behavior During Therapy: WFL for tasks assessed/performed Overall Cognitive Status: Within Functional Limits for tasks assessed                      Exercises      General Comments        Pertinent Vitals/Pain Pain Assessment: 0-10 Pain Score: 2  Pain Location: L LE Pain Descriptors / Indicators: Sore Pain Intervention(s): Monitored during session;Repositioned;Ice applied    Home Living                      Prior Function            PT Goals (current goals can now be found in the care plan section) Acute Rehab PT Goals Patient Stated Goal: to go home today Progress towards PT goals: Progressing toward goals    Frequency  Min 6X/week    PT Plan Current plan remains appropriate    Co-evaluation             End of Session Equipment Utilized During Treatment: Gait belt Activity Tolerance: Patient tolerated treatment well Patient left: in chair;with call bell/phone within reach;with chair alarm set     Time: 1000-1025 PT Time Calculation (min) (ACUTE ONLY): 25 min  Charges:  $Gait Training: 8-22 mins $Therapeutic Exercise: 8-22 mins  G Codes:      Armando Reichert 2015-12-25, 1:27 PM

## 2015-11-29 NOTE — Discharge Summary (Signed)
Physician Discharge Summary  [name] OZH:[id number] DOB: 1945-10-15 DOA: 11/26/2015  PCP: Agustina Caroli, MD  Admit date: 11/26/2015 Discharge date: 11/29/2015  Time spent: 35 minutes  Recommendations for Outpatient Follow-up:  Follow up with Dr Ophelia Charter post op  Resume BP medications if BP increased.   Discharge Diagnoses:    Closed left hip fracture (HCC)   UTI   Essential (primary) hypertension   Hypercholesteremia   Hip fracture (HCC)   Preop testing   Discharge Condition: stable.   Diet recommendation: heart healthy   Filed Weights   11/26/15 1654 11/26/15 2339 11/27/15 0940  Weight: 49.896 kg (110 lb) 49.896 kg (110 lb) 49.896 kg (110 lb)    History of present illness:  [name] is a generally healthy 70 y.o. female with PMH of hypertension and hyperlipidemia who presents to the ED with severe acute left hip pain following a ground-level mechanical fall at home. Patient had been in her usual state of fairly good health until just prior to her presentation when she slipped while cleaning her swimming pool, falling hard onto the left hip. She experienced immediate pain and difficulty ambulating and EMS was activated for transport to the hospital. She denies any head strike or loss of consciousness and does not take anticoagulants. She does take a daily 81 mg aspirin. She is a former smoker but denies any underlying cardiopulmonary disease. She was transported to the emergency department at [facility].  In the ED at [facility], patient was found to be afebrile, saturating well on room air, and with vital signs stable. CBC and BMP were obtained and notable only for a leukocytosis to 11,900 with neutrophilic predominance. Radiographs of the left hip and pelvis reveal a mildly impacted nondisplaced subcapital left femoral neck fracture and diffuse osteopenia. Orthopedic surgery was consulted by the EDP and recommended admission to [facility] for  surgical repair of the fracture in the morning. She was given IV fentanyl for pain, remained hemodynamically stable, and was transported to [facility].  The patient is evaluated on the medical-surgical unit at [facility] where she was found to be in no apparent respiratory distress distress and with hemodynamic stability. She is complaining of severe left hip pain after just attempting to adjust her position in bed. Side from the hip pain, she reports pain in her usual state. She denies fevers, chills, cough, dyspnea, chest pain, or palpitations. She understands that there is tentative plans for surgery in the morning and she has no questions at this time.  Hospital Course:  1. Left hip fracture  - Suffered ground-level mechanical fall onto left hip  - Radiographs reveal subcapital femoral neck fx  -resume aspirin when ok by ortho  -S/P Intramedullary nail left intertrochanteric 4-16. -pt per ortho.  -Bowel regimen.  -discharge today if of by ortho  2. Hypertension  -Hold HCTZ post op. SBP in the 120  Hold BP medications at discharge,   3. Hyperlipidemia  - Continue current-dose Pravachol 80 mg qHS   4-mild fever; suspect post op, atelectasis. Incentive spirometry.  UA with multiple bacteria. Will cover for UTI with 3 days of nitrofurantoin.   DVT ppx: SQ Lovenox   Procedures: ORIF left femoral neck Fracture   Consultations:  Dr Ophelia Charter  Discharge Exam: Filed Vitals:   11/28/15 2135 11/29/15 0630  BP: 109/66 123/73  Pulse: 89 94  Temp: 100.2 F (37.9 C) 99 F (37.2 C)  Resp: 17 17  General: NAD Cardiovascular: S 1, S 2 RRR Respiratory: CTA  Discharge Instructions   Discharge Instructions    Diet - low sodium heart healthy    Complete by:  As directed      Partial weight bearing    Complete by:  As directed   Laterality:  left  Extremity:  Lower  50 % Weight Bearing times 6 wks          Current Discharge Medication List    START  taking these medications   Details  bisacodyl (DULCOLAX) 10 MG suppository Place 1 suppository (10 mg total) rectally daily as needed for moderate constipation. Qty: 12 suppository, Refills: 0    docusate sodium (COLACE) 100 MG capsule Take 1 capsule (100 mg total) by mouth 2 (two) times daily. Qty: 10 capsule, Refills: 0    nitrofurantoin, macrocrystal-monohydrate, (MACROBID) 100 MG capsule Take 1 capsule (100 mg total) by mouth every 12 (twelve) hours. Qty: 6 capsule, Refills: 0    senna (SENOKOT) 8.6 MG TABS tablet Take 1 tablet (8.6 mg total) by mouth daily. Qty: 120 each, Refills: 0      CONTINUE these medications which have CHANGED   Details  aspirin (BAYER ASPIRIN) 325 MG tablet Take 1 tablet (325 mg total) by mouth daily.    oxyCODONE-acetaminophen (ROXICET) 5-325 MG tablet Take 1-2 tablets by mouth every 4 (four) hours as needed for severe pain. Qty: 40 tablet, Refills: 0      CONTINUE these medications which have NOT CHANGED   Details  Omega-3 Fatty Acids (FISH OIL) 1000 MG CAPS Take 2,000 mg by mouth daily.    pravastatin (PRAVACHOL) 80 MG tablet Take 80 mg by mouth daily.     traMADol (ULTRAM) 50 MG tablet Take 1-2 tablets (50-100 mg total) by mouth every 6 (six) hours as needed for moderate pain or severe pain. Qty: 40 tablet, Refills: 0      STOP taking these medications     hydrochlorothiazide (MICROZIDE) 12.5 MG capsule      naproxen sodium (ANAPROX) 220 MG tablet        Allergies  Allergen Reactions  . Avelox [Moxifloxacin] Swelling  . Penicillins Other (See Comments)    Reported whole-body skin peeling as a child   . Buprenorphine Hcl Nausea And Vomiting    vomiting  . Morphine And Related Other (See Comments)    vomiting  . Procaine Other (See Comments)    unknown  . Sulfa Antibiotics Other (See Comments)    Unknown  . Sulfacetamide Sodium Other (See Comments)    Unknown  . Aspirin Other (See Comments)    vomiting   Follow-up Information     Follow up with YATES,MARK C, MD In 2 weeks.   Specialty:  Orthopedic Surgery   Contact information:   [address] [place] [address] [phone]       Follow up with HICKS, Sherin Quarry, MD In 2 weeks.   Specialty:  Internal Medicine       The results of significant diagnostics from this hospitalization (including imaging, microbiology, ancillary and laboratory) are listed below for reference.    Significant Diagnostic Studies: Chest Portable 1 View  11/27/2015  CLINICAL DATA:  Preoperative examination prior to orthopedic surgery. Ex-smoker. History of hypertension. EXAM: PORTABLE CHEST 1 VIEW COMPARISON:  None. FINDINGS: Borderline enlarged cardiac silhouette. Tortuous and partially calcified thoracic aorta. Diffusely prominent interstitial markings. Mild scoliosis. IMPRESSION: Borderline cardiomegaly and mild to moderate chronic interstitial lung disease. Electronically Signed  By: Beckie SaltsSteven  Reid M.D.   On: 11/27/2015 07:31   Dg C-arm 1-60 Min-no Report  11/27/2015  CLINICAL DATA: Left hip IM nail C-ARM 1-60 MINUTES Fluoroscopy was utilized by the requesting physician.  No radiographic interpretation.   Dg Hip Operative Unilat W Or W/o Pelvis Left  11/27/2015  CLINICAL DATA:  70 year old female with a history of fractured hip EXAM: DG C-ARM 1-60 MIN-NO REPORT; OPERATIVE LEFT HIP WITH PELVIS FLUOROSCOPY TIME:  Please refer to the dictated operative report for full details of intraoperative findings and procedure. COMPARISON:  11/26/2015 FINDINGS: Intraoperative fluoroscopic spot images. Surgical changes of antegrade left femoral intra medullary rod placement with femoral neck screw fixation for repair of left femoral neck fracture. Single distal interlocking screw. No complicating features. IMPRESSION: Intraoperative fluoroscopic spot images of the left hip ORIF, as above, with no complicating features. Please refer to the dictated operative report for full details of  intraoperative findings and procedure. Signed, Yvone NeuJaime S. Loreta AveWagner, DO [facility] Electronically Signed   By: Gilmer MorJaime  Wagner D.O.   On: 11/27/2015 10:17   Dg Hip Unilat With Pelvis 2-3 Views Left  11/26/2015  CLINICAL DATA:  Left hip pain after fall EXAM: DG HIP (WITH OR WITHOUT PELVIS) 2-3V LEFT COMPARISON:  None. FINDINGS: Mildly impacted nondisplaced subcapital left femoral neck fracture. No dislocation or appreciable arthropathy in the left hip. No suspicious focal osseous lesion. Suggestion of diffuse osteopenia. IMPRESSION: Mildly impacted nondisplaced subcapital left femoral neck fracture. Suggestion of diffuse osteopenia. Electronically Signed   By: Delbert PhenixJason A Poff M.D.   On: 11/26/2015 17:42    Microbiology: Recent Results (from the past 240 hour(s))  Urine culture     Status: None   Collection Time: 11/27/15  4:25 AM  Result Value Ref Range Status   Specimen Description URINE, CATHETERIZED  Final   Special Requests NONE  Final   Culture MULTIPLE SPECIES PRESENT, SUGGEST RECOLLECTION  Final   Report Status 11/28/2015 FINAL  Final  MRSA PCR Screening     Status: None   Collection Time: 11/27/15  5:32 AM  Result Value Ref Range Status   MRSA by PCR NEGATIVE NEGATIVE Final    Comment:        The GeneXpert MRSA Assay (FDA approved for NASAL specimens only), is one component of a comprehensive MRSA colonization surveillance program. It is not intended to diagnose MRSA infection nor to guide or monitor treatment for MRSA infections.      Labs: Basic Metabolic Panel:  Recent Labs Lab 11/26/15 1805 11/27/15 1019 11/28/15 0401 11/29/15 0406  NA 139  --  135 135  K 3.5  --  3.7 3.5  CL 103  --  99* 97*  CO2 27  --  30 29  GLUCOSE 99  --  134* 154*  BUN 15  --  10 6  CREATININE 0.80 0.79 0.82 0.63  CALCIUM 9.1  --  8.4* 8.7*   Liver Function Tests: No results for input(s): AST, ALT, ALKPHOS, BILITOT, PROT, ALBUMIN in  the last 168 hours. No results for input(s): LIPASE, AMYLASE in the last 168 hours. No results for input(s): AMMONIA in the last 168 hours. CBC:  Recent Labs Lab 11/26/15 1805 11/27/15 1019 11/28/15 0401 11/29/15 0406  WBC 11.9* 10.9* 8.0 7.9  NEUTROABS 9.6*  --   --   --   HGB 13.3 12.3 11.9* 10.6*  HCT 38.5 35.6* 35.1* 31.9*  MCV 90.8 89.0 91.2 88.6  PLT 211  191 172 158   Cardiac Enzymes: No results for input(s): CKTOTAL, CKMB, CKMBINDEX, TROPONINI in the last 168 hours. BNP: BNP (last 3 results) No results for input(s): BNP in the last 8760 hours.  ProBNP (last 3 results) No results for input(s): PROBNP in the last 8760 hours.  CBG: No results for input(s): GLUCAP in the last 168 hours.     Signed:  Alba Cory MD.  [facility] 11/29/2015, 8:06 AM

## 2016-05-16 DIAGNOSIS — H938X3 Other specified disorders of ear, bilateral: Secondary | ICD-10-CM | POA: Insufficient documentation

## 2017-02-05 ENCOUNTER — Ambulatory Visit (INDEPENDENT_AMBULATORY_CARE_PROVIDER_SITE_OTHER): Payer: Medicare Other

## 2017-02-05 ENCOUNTER — Encounter (INDEPENDENT_AMBULATORY_CARE_PROVIDER_SITE_OTHER): Payer: Self-pay | Admitting: Orthopaedic Surgery

## 2017-02-05 ENCOUNTER — Ambulatory Visit (INDEPENDENT_AMBULATORY_CARE_PROVIDER_SITE_OTHER): Payer: Medicare Other | Admitting: Orthopaedic Surgery

## 2017-02-05 VITALS — BP 147/86 | HR 67 | Ht 63.0 in | Wt 115.0 lb

## 2017-02-05 DIAGNOSIS — M79652 Pain in left thigh: Secondary | ICD-10-CM | POA: Diagnosis not present

## 2017-02-05 NOTE — Progress Notes (Signed)
Office Visit Note   Patient: [name]           Date of Birth: May 18, 1946           MRN: [id number] Visit Date: 02/05/2017              Requested by: Agustina Caroli, MD No address on file PCP: Agustina Caroli, MD   Assessment & Plan: Visit Diagnoses:  1. Left thigh pain     Plan: Patient is a weak quad gait. Quad weakness may know testing. ADductor strength is strong bilaterally. No quad weakness on the right. Sensory testing is normal. She will work hard on quad strengthening with therapy weekly basis and then the other days the week she'll do therapy on her own. I'll recheck her in 8 weeks.  Follow-Up Instructions: Recheck 8 weeks to check her progress.  Orders:  Orders Placed This Encounter  Procedures  . XR FEMUR MIN 2 VIEWS LEFT   No orders of the defined types were placed in this encounter.     Procedures: No procedures performed   Clinical Data: No additional findings.   Subjective: Chief Complaint  Patient presents with  . Left Leg - Pain    HPI 71 year old female returns 14 months post nail for intertrochanteric fracture. She's been a Mid[place] she relates it tired when she goes to the store she can't walk upstairs with this reciprocal gait. States she has a wall clinic in normal person and leaned forward with the step on the left side with a weak quad gait. She's used some Aleve when necessary.  Review of Systems 14 point review of systems is updated is unchanged from last visit other than as it pertains to history of present illness   Objective: Vital Signs: BP (!) 147/86   Pulse 67   Ht 5\' 3"  (1.6 m)   Wt 115 lb (52.2 kg)   BMI 20.37 kg/m   Physical Exam  Constitutional: She is oriented to person, place, and time. She appears well-developed.  HENT:  Head: Normocephalic.  Right Ear: External ear normal.  Left Ear: External ear normal.  Eyes: Pupils are equal, round, and reactive to light.  Neck: No tracheal deviation present. No  thyromegaly present.  Cardiovascular: Normal rate.   Pulmonary/Chest: Effort normal.  Abdominal: Soft.  Musculoskeletal:  No sciatic notch tenderness pelvis level she is a mature the weak quad gait with the 4 flexion at the waist with each step on the left. She has weakness of the left quadrant may think removed over, with arm strength opposite leg shows good quad strength no abductor weakness hamstrings are normal no pitting edema hip incisions laterally for her rush or TROCHANTERIC  nail looked good.  Neurological: She is alert and oriented to person, place, and time.  Skin: Skin is warm and dry.  Psychiatric: She has a normal mood and affect. Her behavior is normal.    Ortho Exam  Specialty Comments:  No specialty comments available.  Imaging: No results found.   PMFS History: Patient Active Problem List   Diagnosis Date Noted  . Hip fracture (HCC) 11/27/2015  . Preop testing   . Closed left hip fracture (HCC) 11/26/2015  . Epigastric abdominal pain 04/14/2015  . Essential (primary) hypertension 03/31/2015  . 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 Hypercholesteremia 03/31/2015  . Kidney cysts 03/31/2015   Past Medical History:  Diagnosis Date  . High cholesterol   . Hypertension     No family  history on file.  Past Surgical History:  Procedure Laterality Date  . ABDOMINAL HYSTERECTOMY    . CHOLECYSTECTOMY N/A 01/21/2015   Procedure: LAPAROSCOPIC CHOLECYSTECTOMY;  Surgeon: Jimmye NormanJames Wyatt, MD;  [facility];  Service: General;  Laterality: N/A;  . INTRAMEDULLARY (IM) NAIL INTERTROCHANTERIC Left 11/27/2015   Procedure: INTRAMEDULLARY (IM) NAIL INTERTROCHANTRIC;  Surgeon: Eldred MangesMark C Yates, MD;  Location: [facility] ORS;  Service: Orthopedics;  Laterality: Left;  . LAPAROSCOPY N/A 04/15/2015   Procedure: LAPAROSCOPY DIAGNOSTIC ileocalpexy ;  Surgeon: Karie SodaSteven Gross, MD;  Location: [facility] ORS;  Service: General;  Laterality: N/A;  . SMALL INTESTINE SURGERY Right   . TONSILLECTOMY    . TUBAL LIGATION   1980   Social History   Occupational History  . Not on file.   Social History Main Topics  . Smoking status: Former Smoker    Quit date: 08/13/2012  . Smokeless tobacco: Never Used  . Alcohol use Yes     Comment: 1 beer/day  . Drug use: No  . Sexual activity: Not on file

## 2017-04-09 ENCOUNTER — Ambulatory Visit (INDEPENDENT_AMBULATORY_CARE_PROVIDER_SITE_OTHER): Payer: Medicare Other | Admitting: Orthopaedic Surgery

## 2017-04-09 VITALS — BP 134/76 | HR 66 | Temp 97.5°F

## 2017-04-09 DIAGNOSIS — S72002D Fracture of unspecified part of neck of left femur, subsequent encounter for closed fracture with routine healing: Secondary | ICD-10-CM

## 2017-04-09 NOTE — Progress Notes (Signed)
Office Visit Note   Patient: [name]           Date of Birth: 1946/06/03           MRN: [id number] Visit Date: 04/09/2017              Requested by: Agustina Caroli, MD No address on file PCP: Agustina Caroli, MD   Assessment & Plan: Visit Diagnoses:  1. Closed fracture of left hip with routine healing, subsequent encounter     Plan: Patient has good improvement quad weakness and now walking with a normal gait. She'll continue strengthening program at the [facility] and I will check her back again on a when necessary basis. She separate the results of her therapy.  Follow-Up Instructions: Return if symptoms worsen or fail to improve.   Orders:  No orders of the defined types were placed in this encounter.  No orders of the defined types were placed in this encounter.     Procedures: No procedures performed   Clinical Data: No additional findings.   Subjective: No chief complaint on file.   HPI patient turns for 2 month follow-up after intertrochanteric fracture with persistent quad weakness. She's been through therapy as made excellent improvement in along [facility] with a weak quad gait. She is community ambulating female with much less problems and states she is doing great.  Review of Systems use systems updated and is unchanged from last visit.   Objective: Vital Signs: There were no vitals taken for this visit.  Physical Exam  Constitutional: She is oriented to person, place, and time. She appears well-developed.  HENT:  Head: Normocephalic.  Right Ear: External ear normal.  Left Ear: External ear normal.  Eyes: Pupils are equal, round, and reactive to light.  Neck: No tracheal deviation present. No thyromegaly present.  Cardiovascular: Normal rate.   Pulmonary/Chest: Effort normal.  Abdominal: Soft.  Musculoskeletal:  Patient is a mature he no longer walk 1/position with a week without quad gait on the left. She can get up from a chair  without using her hands. Quad takes good manual resistance with testing abductors are strong.  Neurological: She is alert and oriented to person, place, and time.  Skin: Skin is warm and dry.  Psychiatric: She has a normal mood and affect. Her behavior is normal.    Ortho Exam  Specialty Comments:  No specialty comments available.  Imaging: No results found.   PMFS History: Patient Active Problem List   Diagnosis Date Noted  . Hip fracture (HCC) 11/27/2015  . Preop testing   . Closed left hip fracture (HCC) 11/26/2015  . Epigastric abdominal pain 04/14/2015  . Essential (primary) hypertension 03/31/2015  . 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 Hypercholesteremia 03/31/2015  . Kidney cysts 03/31/2015   Past Medical History:  Diagnosis Date  . High cholesterol   . Hypertension     No family history on file.  Past Surgical History:  Procedure Laterality Date  . ABDOMINAL HYSTERECTOMY    . CHOLECYSTECTOMY N/A 01/21/2015   Procedure: LAPAROSCOPIC CHOLECYSTECTOMY;  Surgeon: Jimmye Norman, MD;  Location: [facility] OR;  Service: General;  Laterality: N/A;  . INTRAMEDULLARY (IM) NAIL INTERTROCHANTERIC Left 11/27/2015   Procedure: INTRAMEDULLARY (IM) NAIL INTERTROCHANTRIC;  Surgeon: Eldred Manges, MD;  Location: [facility]S;  Service: Orthopedics;  Laterality: Left;  . LAPAROSCOPY N/A 04/15/2015   Procedure: LAPAROSCOPY DIAGNOSTIC ileocalpexy ;  Surgeon: Karie Soda, MD;  Location: [facility] ORS;  Service: General;  Laterality: N/A;  . SMALL INTESTINE SURGERY Right   . TONSILLECTOMY    . TUBAL LIGATION  1980   Social History   Occupational History  . Not on file.   Social History Main Topics  . Smoking status: Former Smoker    Quit date: 08/13/2012  . Smokeless tobacco: Never Used  . Alcohol use Yes     Comment: 1 beer/day  . Drug use: No  . Sexual activity: Not on file

## 2020-05-06 ENCOUNTER — Emergency Department (HOSPITAL_BASED_OUTPATIENT_CLINIC_OR_DEPARTMENT_OTHER): Payer: Medicare Other

## 2020-05-06 ENCOUNTER — Other Ambulatory Visit: Payer: Self-pay

## 2020-05-06 ENCOUNTER — Emergency Department (HOSPITAL_BASED_OUTPATIENT_CLINIC_OR_DEPARTMENT_OTHER)
Admission: EM | Admit: 2020-05-06 | Discharge: 2020-05-06 | Disposition: A | Discharge status: Home / Self Care | Payer: Medicare Other | Attending: Emergency Medicine | Admitting: Emergency Medicine

## 2020-05-06 ENCOUNTER — Encounter (HOSPITAL_BASED_OUTPATIENT_CLINIC_OR_DEPARTMENT_OTHER): Payer: Self-pay | Admitting: *Deleted

## 2020-05-06 DIAGNOSIS — S82892A Other fracture of left lower leg, initial encounter for closed fracture: Secondary | ICD-10-CM

## 2020-05-06 DIAGNOSIS — Z87891 Personal history of nicotine dependence: Secondary | ICD-10-CM | POA: Diagnosis not present

## 2020-05-06 DIAGNOSIS — I10 Essential (primary) hypertension: Secondary | ICD-10-CM | POA: Diagnosis not present

## 2020-05-06 DIAGNOSIS — S93326A Dislocation of tarsometatarsal joint of unspecified foot, initial encounter: Secondary | ICD-10-CM | POA: Diagnosis not present

## 2020-05-06 DIAGNOSIS — Z79899 Other long term (current) drug therapy: Secondary | ICD-10-CM | POA: Diagnosis not present

## 2020-05-06 DIAGNOSIS — S99912A Unspecified injury of left ankle, initial encounter: Secondary | ICD-10-CM | POA: Diagnosis present

## 2020-05-06 DIAGNOSIS — Z7982 Long term (current) use of aspirin: Secondary | ICD-10-CM | POA: Insufficient documentation

## 2020-05-06 DIAGNOSIS — W19XXXA Unspecified fall, initial encounter: Secondary | ICD-10-CM | POA: Diagnosis not present

## 2020-05-06 DIAGNOSIS — Y93K1 Activity, walking an animal: Secondary | ICD-10-CM | POA: Insufficient documentation

## 2020-05-06 DIAGNOSIS — S93325A Dislocation of tarsometatarsal joint of left foot, initial encounter: Secondary | ICD-10-CM

## 2020-05-06 DIAGNOSIS — S8292XA Unspecified fracture of left lower leg, initial encounter for closed fracture: Secondary | ICD-10-CM | POA: Diagnosis not present

## 2020-05-06 MED ORDER — ONDANSETRON HCL 4 MG PO TABS: 4.0000 mg | ORAL_TABLET | Freq: Four times a day (QID) | ORAL | 0 refills | Status: DC

## 2020-05-06 MED ORDER — HYDROCODONE-ACETAMINOPHEN 5-325 MG PO TABS
1.0000 | ORAL_TABLET | Freq: Once | ORAL | Status: AC
  Administered 2020-05-06: 1 via ORAL
  Filled 2020-05-06: qty 1

## 2020-05-06 MED ORDER — ONDANSETRON 4 MG PO TBDP
4.0000 mg | ORAL_TABLET | Freq: Once | ORAL | Status: AC
  Administered 2020-05-06: 4 mg via ORAL
  Filled 2020-05-06: qty 1

## 2020-05-06 MED ORDER — HYDROCODONE-ACETAMINOPHEN 5-325 MG PO TABS: 1.0000 | ORAL_TABLET | Freq: Three times a day (TID) | ORAL | 0 refills | Status: DC

## 2020-05-06 NOTE — ED Provider Notes (Signed)
[facility] EMERGENCY DEPARTMENT Provider Note   CSN: [id number] Arrival date & time: 05/06/20  1738     History Chief Complaint  Patient presents with  . Ankle Injury    [name] is a 74 y.o. female.  HPI   Pt is a 74 y/o female with a h/o HTN, HLD, who who presents emergency department today for evaluation after a fall.  Patient states she was walking her dog when it started running away and pulled her to the ground.  She twisted her ankle and now has pain to the left ankle/foot.  She denies any head trauma or LOC.  She denies any other injuries at all.  There are no wounds and she denies any numbness/weakness.  Past Medical History:  Diagnosis Date  . High cholesterol   . Hypertension     Patient Active Problem List   Diagnosis Date Noted  . Hip fracture (HCC) 11/27/2015  . Preop testing   . Closed left hip fracture (HCC) 11/26/2015  . Epigastric abdominal pain 04/14/2015  . Essential (primary) hypertension 03/31/2015  . 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 Hypercholesteremia 03/31/2015  . Kidney cysts 03/31/2015    Past Surgical History:  Procedure Laterality Date  . ABDOMINAL HYSTERECTOMY    . CHOLECYSTECTOMY N/A 01/21/2015   Procedure: LAPAROSCOPIC CHOLECYSTECTOMY;  Surgeon: Jimmye Norman, MD;  Location: [facility] OR;  Service: General;  Laterality: N/A;  . INTRAMEDULLARY (IM) NAIL INTERTROCHANTERIC Left 11/27/2015   Procedure: INTRAMEDULLARY (IM) NAIL INTERTROCHANTRIC;  Surgeon: Eldred Manges, MD;  Location: [facility] ORS;  Service: Orthopedics;  Laterality: Left;  . LAPAROSCOPY N/A 04/15/2015   Procedure: LAPAROSCOPY DIAGNOSTIC ileocalpexy ;  Surgeon: Karie Soda, MD;  Location: [facility] ORS;  Service: General;  Laterality: N/A;  . SMALL INTESTINE SURGERY Right   . TONSILLECTOMY    . TUBAL LIGATION  1980     OB History   No obstetric history on file.     No family history on file.  Social History   Tobacco Use  . Smoking status: Former Smoker    Quit date:  08/13/2012    Years since quitting: 7.7  . Smokeless tobacco: Never Used  Substance Use Topics  . Alcohol use: Yes    Comment: 1 beer/day  . Drug use: No    Home Medications Prior to Admission medications   Medication Sig Start Date End Date Taking? Authorizing Provider  aspirin (BAYER ASPIRIN) 325 MG tablet Take 1 tablet (325 mg total) by mouth daily. 11/28/15   Eldred Manges, MD  bisacodyl (DULCOLAX) 10 MG suppository Place 1 suppository (10 mg total) rectally daily as needed for moderate constipation. Patient not taking: Reported on 02/05/2017 11/29/15   Regalado, Jon Billings A, MD  docusate sodium (COLACE) 100 MG capsule Take 1 capsule (100 mg total) by mouth 2 (two) times daily. Patient not taking: Reported on 02/05/2017 11/29/15   Regalado, Jon Billings A, MD  hydrochlorothiazide (MICROZIDE) 12.5 MG capsule TAKE 2 CAPSULES DAILY 11/27/16   [provider]  HYDROcodone-acetaminophen (NORCO/VICODIN) 5-325 MG tablet Take 1 tablet by mouth every 8 (eight) hours. 05/06/20   Leean Amezcua S, PA-C  nitrofurantoin, macrocrystal-monohydrate, (MACROBID) 100 MG capsule Take 1 capsule (100 mg total) by mouth every 12 (twelve) hours. 11/29/15   Regalado, Belkys A, MD  Omega-3 Fatty Acids (FISH OIL) 1000 MG CAPS Take 2,000 mg by mouth daily.    [provider]  ondansetron (ZOFRAN) 4 MG tablet Take 1 tablet (4 mg total) by mouth every  6 (six) hours. 05/06/20   Jennessy Sandridge S, PA-C  oxyCODONE-acetaminophen (ROXICET) 5-325 MG tablet Take 1-2 tablets by mouth every 4 (four) hours as needed for severe pain. Patient not taking: Reported on 02/05/2017 11/28/15   Eldred MangesYates, Mark C, MD  pravastatin (PRAVACHOL) 80 MG tablet Take 80 mg by mouth daily.  09/29/15   [provider]  senna (SENOKOT) 8.6 MG TABS tablet Take 1 tablet (8.6 mg total) by mouth daily. 11/29/15   Regalado, Belkys A, MD  traMADol (ULTRAM) 50 MG tablet Take 1-2 tablets (50-100 mg total) by mouth every 6 (six) hours as needed for  moderate pain or severe pain. Patient not taking: Reported on 02/05/2017 01/21/15   Jimmye NormanWyatt, James, MD    Allergies    Avelox [moxifloxacin], Penicillins, Buprenorphine hcl, Morphine and related, Procaine, Sulfa antibiotics, Sulfacetamide sodium, and Aspirin  Review of Systems   Review of Systems  Constitutional: Negative for chills and fever.  HENT: Negative for ear pain and sore throat.   Eyes: Negative for visual disturbance.  Respiratory: Negative for shortness of breath.   Cardiovascular: Negative for chest pain.  Gastrointestinal: Negative for abdominal pain and vomiting.  Genitourinary: Negative for flank pain.  Musculoskeletal: Negative for back pain.       Left foot/ankle pain  Skin: Negative for wound.  Neurological: Negative for headaches.  All other systems reviewed and are negative.   Physical Exam Updated Vital Signs BP (!) 151/95   Pulse 86   Temp 99.2 F (37.3 C)   Resp 18   Ht 5\' 2"  (1.575 m)   Wt 49.9 kg   SpO2 96%   BMI 20.12 kg/m   Physical Exam Vitals and nursing note reviewed.  Constitutional:      General: She is not in acute distress.    Appearance: She is well-developed.  HENT:     Head: Normocephalic and atraumatic.  Eyes:     Conjunctiva/sclera: Conjunctivae normal.  Cardiovascular:     Rate and Rhythm: Normal rate and regular rhythm.  Pulmonary:     Effort: Pulmonary effort is normal.     Breath sounds: Normal breath sounds.  Abdominal:     Palpations: Abdomen is soft.     Tenderness: There is no abdominal tenderness.  Musculoskeletal:     Cervical back: Neck supple.     Comments: No CTL spine TTP. TTP to the left lateral malleolus and along the left midfoot with associated swelling. DP pulses intact. Sensation intact. No proximal tib/fib fx.   Skin:    General: Skin is warm and dry.  Neurological:     Mental Status: She is alert.     ED Results / Procedures / Treatments   Labs (all labs ordered are listed, but only abnormal  results are displayed) Labs Reviewed - No data to display  EKG None  Radiology DG Ankle Complete Left  Result Date: 05/06/2020 CLINICAL DATA:  Fall, left ankle swelling EXAM: LEFT ANKLE COMPLETE - 3+ VIEW COMPARISON:  None FINDINGS: Obliquely oriented trans syndesmotic fracture of the distal fibular metaphysis with extension into the ankle mortise. Small amount of lucency towards the tip of the medial malleolus could reflect a small avulsive type injury as well, should correlate for medial point tenderness. Moderate ankle joint effusion. Circumferential soft tissue swelling. Swelling most pronounced laterally adjacent the visible fracture line. No other acute osseous or soft tissue abnormality. IMPRESSION: 1. Obliquely oriented transsyndesmotic fracture (Weber B) of the distal fibular metaphysis with extension into the  ankle mortise. 2. Small amount of lucency towards the tip of the medial malleolus could reflect a small avulsive type injury as well, should correlate for medial point tenderness. Electronically Signed   By: Kreg Shropshire M.D.   On: 05/06/2020 19:05   CT Foot Left Wo Contrast  Result Date: 05/06/2020 CLINICAL DATA:  Lisfranc type midfoot injury. EXAM: CT OF THE LEFT FOOT WITHOUT CONTRAST TECHNIQUE: Multidetector CT imaging of the left foot was performed according to the standard protocol. Multiplanar CT image reconstructions were also generated. COMPARISON:  Radiographs, same date. FINDINGS: As demonstrated on the foot radiographs there is a complex comminuted Lisfranc type midfoot injury with numerous fractures involving the metatarsal bases and the cuneiforms. The second metatarsal base is subluxed laterally and there is a small bony fragment between the base of the second metatarsal and the medial cuneiform consistent with a disruption of the Lisfranc ligament. There is also mild lateral and dorsal displacement of the third metatarsal base in relation to the lateral cuneiform. The  fourth and fifth metatarsal bases are normally aligned with the cuboid but there are nondisplaced fractures involving the base of the fourth metatarsal and a comminuted fracture of the cuboid. I do not see a definite fracture of the fifth metatarsal base. The metatarsal shafts are intact and the MTP joints are maintained. The tibiotalar joint space is maintained. Suspect a subtle nondisplaced fracture involving the posterior malleolus of the tibia. The talus is intact and the navicular bone is intact. No calcaneal fractures. There is a longitudinal mildly displaced fracture of the distal fibula. IMPRESSION: 1. Lisfranc type midfoot injury with complex comminuted midfoot fractures and tarsal metatarsal subluxations as detailed above. 2. Suspect subtle nondisplaced fracture involving the posterior malleolus of the tibia. 3. Mildly displaced longitudinal fracture of the distal fibula. Electronically Signed   By: Rudie Meyer M.D.   On: 05/06/2020 20:42   DG Foot Complete Left  Result Date: 05/06/2020 CLINICAL DATA:  Larey Seat today.  Foot and ankle pain. EXAM: LEFT FOOT - COMPLETE 3+ VIEW COMPARISON:  None. FINDINGS: Complex midfoot fractures and suspected Lisfranc type midfoot injury. There are fractures of the first, second and third metatarsal bases and suspected fractures of the medial and middle cuneiform is. Mild widening between the first and second metatarsal bases and between the medial and middle cuneiform is. CT examination of the left foot is recommended for further evaluation. IMPRESSION: 1. Complex midfoot fractures and suspected Lisfranc type midfoot injury. 2. CT examination of the left foot is recommended for further evaluation. Electronically Signed   By: Rudie Meyer M.D.   On: 05/06/2020 19:58    Procedures Procedures (including critical care time)  SPLINT APPLICATION Date/Time: 10:08 PM Authorized by: Karrie Meres Consent: Verbal consent obtained. Risks and benefits: risks, benefits  and alternatives were discussed Consent given by: patient Splint applied by: technician Location details: LLE Splint type: posterior ankle with stirrup Post-procedure: The splinted body part was neurovascularly unchanged following the procedure. Patient tolerance: Patient tolerated the procedure well with no immediate complications.   Medications Ordered in ED Medications  HYDROcodone-acetaminophen (NORCO/VICODIN) 5-325 MG per tablet 1 tablet (1 tablet Oral Given 05/06/20 2000)  ondansetron (ZOFRAN-ODT) disintegrating tablet 4 mg (4 mg Oral Given 05/06/20 2000)    ED Course  I have reviewed the triage vital signs and the nursing notes.  Pertinent labs & imaging results that were available during my care of the patient were reviewed by me and considered in my medical decision  making (see chart for details).    MDM Rules/Calculators/A&P                          74 year old female presenting for evaluation of mechanical fall.  Complaining of left ankle/foot pain.  Has tenderness and swelling on exam.  All imaging reviewed/interpreted X-ray left ankle -  1. Obliquely oriented transsyndesmotic fracture (Weber B) of the distal fibular metaphysis with extension into the ankle mortise. 2. Small amount of lucency towards the tip of the medial malleolus could reflect a small avulsive type injury as well, should correlate for medial point tenderness.  X-ray left foot - 1. Complex midfoot fractures and suspected Lisfranc type midfoot injury. 2. CT examination of the left foot is recommended for further evaluation.  CT left foot - 1. Lisfranc type midfoot injury with complex comminuted midfoot fractures and tarsal metatarsal subluxations as detailed above. 2. Suspect subtle nondisplaced fracture involving the posterior malleolus of the tibia. 3. Mildly displaced longitudinal fracture of the distal fibula.  Patient states that she cannot use crutches but has a walker at home and feels that she will  be able to get around and not bear weight by using her walker as this is what she used when she sustained a femur fracture.  She also has a wheelchair at home.  CONSULT with Dr. Ophelia Charter with orthopedic surgery who recommends ankle splint, immobilization, nonweight bearing and f/u in the office.   Reassessed patient following splint placement.  Discussed findings and plan for follow-up with Dr. Ophelia Charter office.  Emphasized importance of keeping leg elevated.  Advised on specific return precautions.  She voices understanding of the plan and reasons to return.  Questions answered.  Patient stable for discharge.   Final Clinical Impression(s) / ED Diagnoses Final diagnoses:  Closed fracture of left ankle, initial encounter  Lisfranc dislocation, left, initial encounter    Rx / DC Orders ED Discharge Orders         Ordered    HYDROcodone-acetaminophen (NORCO/VICODIN) 5-325 MG tablet  Every 8 hours        05/06/20 2204    ondansetron (ZOFRAN) 4 MG tablet  Every 6 hours        05/06/20 2204           Karrie Meres, PA-C 05/06/20 2208    Pollyann Savoy, MD 05/06/20 [phone]

## 2020-05-06 NOTE — Discharge Instructions (Signed)
Prescription given for Norco. Take medication as directed and do not operate machinery, drive a car, or work while taking this medication as it can make you drowsy.  You are also given a prescription for Zofran which can help with nausea.  You should take this medication when you take the pain medication so that you do not experience any nausea.  Additionally, the pain medication may make you constipated if you experience this you should buy some over-the-counter MiraLAX and take it.  It is extremely important that she keep your leg elevated to help reduce the amount of swelling.  Your leg needs to be elevated above your heart.  You can also use ice packs to help reduce swelling.  Do not put any weight on your leg.  You will need to follow-up with Dr. Marlene Bast office.  Please call the office on Monday to schedule an appointment for follow-up.  You will likely be able to be seen in the clinic on Tuesday.  Please return the emergency department for any new or worsening concerns in the meantime.

## 2020-05-06 NOTE — ED Triage Notes (Signed)
fall with left ankle swelling x 1 hr

## 2020-05-11 ENCOUNTER — Encounter: Payer: Self-pay | Admitting: Orthopaedic Surgery

## 2020-05-11 ENCOUNTER — Ambulatory Visit (INDEPENDENT_AMBULATORY_CARE_PROVIDER_SITE_OTHER): Payer: Medicare Other | Admitting: Orthopaedic Surgery

## 2020-05-11 DIAGNOSIS — S92325A Nondisplaced fracture of second metatarsal bone, left foot, initial encounter for closed fracture: Secondary | ICD-10-CM

## 2020-05-11 DIAGNOSIS — S93325A Dislocation of tarsometatarsal joint of left foot, initial encounter: Secondary | ICD-10-CM

## 2020-05-11 DIAGNOSIS — S8265XA Nondisplaced fracture of lateral malleolus of left fibula, initial encounter for closed fracture: Secondary | ICD-10-CM | POA: Diagnosis not present

## 2020-05-11 DIAGNOSIS — S92335A Nondisplaced fracture of third metatarsal bone, left foot, initial encounter for closed fracture: Secondary | ICD-10-CM | POA: Diagnosis not present

## 2020-05-11 DIAGNOSIS — S92355A Nondisplaced fracture of fifth metatarsal bone, left foot, initial encounter for closed fracture: Secondary | ICD-10-CM

## 2020-05-11 DIAGNOSIS — S92212A Displaced fracture of cuboid bone of left foot, initial encounter for closed fracture: Secondary | ICD-10-CM

## 2020-05-11 DIAGNOSIS — S92345A Nondisplaced fracture of fourth metatarsal bone, left foot, initial encounter for closed fracture: Secondary | ICD-10-CM | POA: Diagnosis not present

## 2020-05-11 NOTE — Progress Notes (Signed)
Office Visit Note   Patient: [name]           Date of Birth: 1946/07/02           MRN: [id number] Visit Date: 05/11/2020              Requested by: Agustina Caroli, MD No address on file PCP: Agustina Caroli, MD   Assessment & Plan: Visit Diagnoses:  1. Lisfranc dislocation, left, initial encounter   2. Closed nondisplaced fracture of lateral malleolus of left fibula, initial encounter     Plan: Long discussion with patient concerning her foot injury as well as lateral malleolar fracture.  Operative versus nonoperative treatment discussed this was a low energy fracture.  Lisfranc injury is present however she is in satisfactory position and will cast today and proceed with nonoperative treatment.  She has fractures of her second third and fourth metatarsals.  Fractures of the cuneiform.  Repeat x-rays on return in her cast AP lateral and oblique of the foot and we discussed that if she begins to sublux then operative stabilization would be needed for both the lateral malleolus as well as Lisfranc joint with multiple screw fixation.  X-rays of her ankle and x-rays of her foot on return.  She has knee roller, wheelchair also walker and previously used a walker when she had a broken hip and promises to be compliant with nonweightbearing on her foot.   Follow-Up Instructions: No follow-ups on file.   Orders:  No orders of the defined types were placed in this encounter.  No orders of the defined types were placed in this encounter.     Procedures: No procedures performed   Clinical Data: No additional findings.   Subjective: Chief Complaint  Patient presents with  . Left Foot - Pain    HPI 74 year old female fell 05/06/2020 when she was knocked over by dog.  X-ray CT scan of her foot demonstrated close lives [place] type injury.  There was distal fibula lateral malleolar fracture which is displaced 1 mm.  Patient is in a splint has been nonweightbearing.  She has a  knee roller also wheelchair and also a walker available.  Previously treated by me with left trochanteric nail for femoral neck fracture in 2017.  Review of Systems all other systems are negative is obtained HPI.   Objective: Vital Signs: BP 127/74   Pulse 79   Physical Exam Constitutional:      Appearance: She is well-developed.  HENT:     Head: Normocephalic.     Right Ear: External ear normal.     Left Ear: External ear normal.  Eyes:     Pupils: Pupils are equal, round, and reactive to light.  Neck:     Thyroid: No thyromegaly.     Trachea: No tracheal deviation.  Cardiovascular:     Rate and Rhythm: Normal rate.  Pulmonary:     Effort: Pulmonary effort is normal.  Abdominal:     Palpations: Abdomen is soft.  Skin:    General: Skin is warm and dry.  Neurological:     Mental Status: She is alert and oriented to person, place, and time.  Psychiatric:        Behavior: Behavior normal.     Ortho Exam patient has some mild forefoot edema tenderness over the distal fibula.  Less foot swelling than expected for her multiple proximal metatarsal fractures involvement.  Pulses palpable sensation of foot is intact. Specialty Comments:  No specialty comments available.  Imaging: CLINICAL DATA:  Lisfranc type midfoot injury.  EXAM: CT OF THE LEFT FOOT WITHOUT CONTRAST  TECHNIQUE: Multidetector CT imaging of the left foot was performed according to the standard protocol. Multiplanar CT image reconstructions were also generated.  COMPARISON:  Radiographs, same date.  FINDINGS: As demonstrated on the foot radiographs there is a complex comminuted Lisfranc type midfoot injury with numerous fractures involving the metatarsal bases and the cuneiforms. The second metatarsal base is subluxed laterally and there is a small bony fragment between the base of the second metatarsal and the medial cuneiform consistent with a disruption of the Lisfranc ligament.  There is  also mild lateral and dorsal displacement of the third metatarsal base in relation to the lateral cuneiform. The fourth and fifth metatarsal bases are normally aligned with the cuboid but there are nondisplaced fractures involving the base of the fourth metatarsal and a comminuted fracture of the cuboid. I do not see a definite fracture of the fifth metatarsal base.  The metatarsal shafts are intact and the MTP joints are maintained.  The tibiotalar joint space is maintained. Suspect a subtle nondisplaced fracture involving the posterior malleolus of the tibia. The talus is intact and the navicular bone is intact. No calcaneal fractures. There is a longitudinal mildly displaced fracture of the distal fibula.  IMPRESSION: 1. Lisfranc type midfoot injury with complex comminuted midfoot fractures and tarsal metatarsal subluxations as detailed above. 2. Suspect subtle nondisplaced fracture involving the posterior malleolus of the tibia. 3. Mildly displaced longitudinal fracture of the distal fibula.   Electronically Signed   By: Rudie Meyer M.D.   On: 05/06/2020 20:42   PMFS History: Patient Active Problem List   Diagnosis Date Noted  . Lisfranc dislocation, left, initial encounter 05/13/2020  . Lateral malleolar fracture 05/13/2020  . Hip fracture (HCC) 11/27/2015  . Preop testing   . Closed left hip fracture (HCC) 11/26/2015  . Epigastric abdominal pain 04/14/2015  . Essential (primary) hypertension 03/31/2015  . 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 Hypercholesteremia 03/31/2015  . Kidney cysts 03/31/2015   Past Medical History:  Diagnosis Date  . High cholesterol   . Hypertension     History reviewed. No pertinent family history.  Past Surgical History:  Procedure Laterality Date  . ABDOMINAL HYSTERECTOMY    . CHOLECYSTECTOMY N/A 01/21/2015   Procedure: LAPAROSCOPIC CHOLECYSTECTOMY;  Surgeon: Jimmye Norman, MD;  Location: [facility] OR;  Service: General;  Laterality: N/A;  .  INTRAMEDULLARY (IM) NAIL INTERTROCHANTERIC Left 11/27/2015   Procedure: INTRAMEDULLARY (IM) NAIL INTERTROCHANTRIC;  Surgeon: Eldred Manges, MD;  Location: [facility] ORS;  Service: Orthopedics;  Laterality: Left;  . LAPAROSCOPY N/A 04/15/2015   Procedure: LAPAROSCOPY DIAGNOSTIC ileocalpexy ;  Surgeon: Karie Soda, MD;  Location: [facility];  Service: General;  Laterality: N/A;  . SMALL INTESTINE SURGERY Right   . TONSILLECTOMY    . TUBAL LIGATION  1980   Social History   Occupational History  . Not on file  Tobacco Use  . Smoking status: Former Smoker    Quit date: 08/13/2012    Years since quitting: 7.7  . Smokeless tobacco: Never Used  Substance and Sexual Activity  . Alcohol use: Yes    Comment: 1 beer/day  . Drug use: No  . Sexual activity: Not on file

## 2020-05-13 ENCOUNTER — Ambulatory Visit: Payer: Medicare Other | Admitting: Orthopaedic Surgery

## 2020-05-13 ENCOUNTER — Other Ambulatory Visit: Payer: Self-pay | Admitting: Orthopaedic Surgery

## 2020-05-13 ENCOUNTER — Telehealth: Payer: Self-pay | Admitting: Orthopaedic Surgery

## 2020-05-13 DIAGNOSIS — S93325A Dislocation of tarsometatarsal joint of left foot, initial encounter: Secondary | ICD-10-CM

## 2020-05-13 DIAGNOSIS — S8263XA Displaced fracture of lateral malleolus of unspecified fibula, initial encounter for closed fracture: Secondary | ICD-10-CM

## 2020-05-13 HISTORY — DX: Dislocation of tarsometatarsal joint of left foot, initial encounter: S93.325A

## 2020-05-13 HISTORY — DX: Displaced fracture of lateral malleolus of unspecified fibula, initial encounter for closed fracture: S82.63XA

## 2020-05-13 MED ORDER — HYDROCODONE-ACETAMINOPHEN 5-325 MG PO TABS: 1.0000 | ORAL_TABLET | Freq: Three times a day (TID) | ORAL | 0 refills | Status: DC

## 2020-05-13 NOTE — Telephone Encounter (Signed)
Patient called requesting a refill of hydrocodone. Please send to pharmacy on file. Patient phone number is [phone].

## 2020-05-13 NOTE — Telephone Encounter (Signed)
Done ucall thanks 

## 2020-05-13 NOTE — Telephone Encounter (Signed)
Please advise 

## 2020-05-25 ENCOUNTER — Ambulatory Visit: Payer: Self-pay

## 2020-05-25 ENCOUNTER — Encounter: Payer: Self-pay | Admitting: Orthopaedic Surgery

## 2020-05-25 ENCOUNTER — Ambulatory Visit (INDEPENDENT_AMBULATORY_CARE_PROVIDER_SITE_OTHER): Payer: Medicare Other | Admitting: Orthopaedic Surgery

## 2020-05-25 VITALS — Ht 62.0 in | Wt 110.0 lb

## 2020-05-25 DIAGNOSIS — S8265XA Nondisplaced fracture of lateral malleolus of left fibula, initial encounter for closed fracture: Secondary | ICD-10-CM

## 2020-05-25 DIAGNOSIS — S93325A Dislocation of tarsometatarsal joint of left foot, initial encounter: Secondary | ICD-10-CM | POA: Diagnosis not present

## 2020-05-25 MED ORDER — HYDROCODONE-ACETAMINOPHEN 5-325 MG PO TABS
1.0000 | ORAL_TABLET | Freq: Three times a day (TID) | ORAL | 0 refills | Status: DC
Start: 2020-05-25 — End: 2021-08-03

## 2020-05-26 NOTE — Progress Notes (Signed)
   Post-Op Visit Note   Patient: [name]           Date of Birth: Dec 12, 1945           MRN: [id number] Visit Date: 05/25/2020 PCP: Agustina Caroli, MD   Assessment & Plan: Follow-up left foot Lisfranc fractures essentially nondisplaced and lateral malleolar fracture treated with cast nonweightbearing.  X-rays show satisfactory position.  Return visit 3 weeks for cast off and x-rays out of cast.  She remains nonweightbearing.  Chief Complaint:  Chief Complaint  Patient presents with  . Left Ankle - Follow-up  . Left Foot - Follow-up   Visit Diagnoses:  1. Lisfranc dislocation, left, initial encounter   2. Closed nondisplaced fracture of lateral malleolus of left fibula, initial encounter     Plan: Return 3 weeks x-rays as above.Norco renewed.   Follow-Up Instructions: Return in about 3 weeks (around 06/15/2020).   Orders:  Orders Placed This Encounter  Procedures  . XR Ankle Complete Left  . XR Foot Complete Left   Meds ordered this encounter  Medications  . HYDROcodone-acetaminophen (NORCO/VICODIN) 5-325 MG tablet    Sig: Take 1 tablet by mouth every 8 (eight) hours.    Dispense:  8 tablet    Refill:  0    Imaging: No results found.  PMFS History: Patient Active Problem List   Diagnosis Date Noted  . Lisfranc dislocation, left, initial encounter 05/13/2020  . Lateral malleolar fracture 05/13/2020  . Hip fracture (HCC) 11/27/2015  . Preop testing   . Closed left hip fracture (HCC) 11/26/2015  . Epigastric abdominal pain 04/14/2015  . Essential (primary) hypertension 03/31/2015  . 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 Hypercholesteremia 03/31/2015  . Kidney cysts 03/31/2015   Past Medical History:  Diagnosis Date  . High cholesterol   . Hypertension     No family history on file.  Past Surgical History:  Procedure Laterality Date  . ABDOMINAL HYSTERECTOMY    . CHOLECYSTECTOMY N/A 01/21/2015   Procedure: LAPAROSCOPIC CHOLECYSTECTOMY;  Surgeon: Jimmye Norman,  MD;  Location: [facility] OR;  Service: General;  Laterality: N/A;  . INTRAMEDULLARY (IM) NAIL INTERTROCHANTERIC Left 11/27/2015   Procedure: INTRAMEDULLARY (IM) NAIL INTERTROCHANTRIC;  Surgeon: Eldred Manges, MD;  Location: [facility] ORS;  Service: Orthopedics;  Laterality: Left;  . LAPAROSCOPY N/A 04/15/2015   Procedure: LAPAROSCOPY DIAGNOSTIC ileocalpexy ;  Surgeon: Karie Soda, MD;  Location: [facility] ORS;  Service: General;  Laterality: N/A;  . SMALL INTESTINE SURGERY Right   . TONSILLECTOMY    . TUBAL LIGATION  1980   Social History   Occupational History  . Not on file  Tobacco Use  . Smoking status: Former Smoker    Quit date: 08/13/2012    Years since quitting: 7.7  . Smokeless tobacco: Never Used  Substance and Sexual Activity  . Alcohol use: Yes    Comment: 1 beer/day  . Drug use: No  . Sexual activity: Not on file

## 2020-06-15 ENCOUNTER — Ambulatory Visit (INDEPENDENT_AMBULATORY_CARE_PROVIDER_SITE_OTHER): Payer: Medicare Other | Admitting: Orthopaedic Surgery

## 2020-06-15 ENCOUNTER — Ambulatory Visit (INDEPENDENT_AMBULATORY_CARE_PROVIDER_SITE_OTHER): Payer: Medicare Other

## 2020-06-15 ENCOUNTER — Encounter: Payer: Self-pay | Admitting: Orthopaedic Surgery

## 2020-06-15 VITALS — Ht 62.0 in | Wt 110.0 lb

## 2020-06-15 DIAGNOSIS — S8265XA Nondisplaced fracture of lateral malleolus of left fibula, initial encounter for closed fracture: Secondary | ICD-10-CM

## 2020-06-15 DIAGNOSIS — S93325A Dislocation of tarsometatarsal joint of left foot, initial encounter: Secondary | ICD-10-CM | POA: Diagnosis not present

## 2020-06-15 NOTE — Progress Notes (Signed)
   Post-Op Visit Note   Patient: [name]           Date of Birth: 01/15/1946           MRN: [id number] Visit Date: 06/15/2020 PCP: Agustina Caroli, MD   Assessment & Plan: Post lateral malleolar nondisplaced fracture and Lisfranc injury treated nonweightbearing.  X-rays show maintenance of position.  She can begin touchdown weightbearing and progress.  Recheck 1 month repeat x-rays on return.  Chief Complaint:  Chief Complaint  Patient presents with  . Left Ankle - Follow-up, Fracture  . Left Foot - Follow-up, Fracture   Visit Diagnoses:  1. Closed nondisplaced fracture of lateral malleolus of left fibula, initial encounter   2. Lisfranc dislocation, left, initial encounter     Plan: Cam boot begin touchdown weightbearing.  Return 1 month for repeat x-rays left foot.  She will not need x-rays of her left ankle on return.  Follow-Up Instructions: No follow-ups on file.   Orders:  Orders Placed This Encounter  Procedures  . XR Ankle Complete Left  . XR Foot Complete Left   No orders of the defined types were placed in this encounter.   Imaging: XR Ankle Complete Left  Result Date: 06/15/2020 Three-view x-rays left ankle obtained and reviewed.  This shows oblique fibular fracture nondisplaced with interval healing more medial than lateral.  Mortise remains reduced without shifting. Impression: Left lateral malleolar fracture with interval healing.  XR Foot Complete Left  Result Date: 06/15/2020 Three-view x-rays left foot obtained and reviewed.  This shows Lisfranc fracture with fracture of the proximal portions of the second third fourth and fifth metatarsals without shifting or displacement. Impression: Left Lisfranc injuries with interval healing.  Position has been maintained comparison to 05/06/2020 CT scan.   PMFS History: Patient Active Problem List   Diagnosis Date Noted  . Lisfranc dislocation, left, initial encounter 05/13/2020  . Lateral malleolar  fracture 05/13/2020  . Hip fracture (HCC) 11/27/2015  . Preop testing   . Closed left hip fracture (HCC) 11/26/2015  . Epigastric abdominal pain 04/14/2015  . Essential (primary) hypertension 03/31/2015  . 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 Hypercholesteremia 03/31/2015  . Kidney cysts 03/31/2015   Past Medical History:  Diagnosis Date  . High cholesterol   . Hypertension     No family history on file.  Past Surgical History:  Procedure Laterality Date  . ABDOMINAL HYSTERECTOMY    . CHOLECYSTECTOMY N/A 01/21/2015   Procedure: LAPAROSCOPIC CHOLECYSTECTOMY;  Surgeon: Jimmye Norman, MD;  Location: [facility] OR;  Service: General;  Laterality: N/A;  . INTRAMEDULLARY (IM) NAIL INTERTROCHANTERIC Left 11/27/2015   Procedure: INTRAMEDULLARY (IM) NAIL INTERTROCHANTRIC;  Surgeon: Eldred Manges, MD;  Location: [facility];  Service: Orthopedics;  Laterality: Left;  . LAPAROSCOPY N/A 04/15/2015   Procedure: LAPAROSCOPY DIAGNOSTIC ileocalpexy ;  Surgeon: Karie Soda, MD;  Location: [facility];  Service: General;  Laterality: N/A;  . SMALL INTESTINE SURGERY Right   . TONSILLECTOMY    . TUBAL LIGATION  1980   Social History   Occupational History  . Not on file  Tobacco Use  . Smoking status: Former Smoker    Quit date: 08/13/2012    Years since quitting: 7.8  . Smokeless tobacco: Never Used  Substance and Sexual Activity  . Alcohol use: Yes    Comment: 1 beer/day  . Drug use: No  . Sexual activity: Not on file

## 2020-06-29 ENCOUNTER — Encounter: Payer: Self-pay | Admitting: Orthopaedic Surgery

## 2020-07-19 ENCOUNTER — Ambulatory Visit (INDEPENDENT_AMBULATORY_CARE_PROVIDER_SITE_OTHER): Payer: Medicare Other | Admitting: Orthopaedic Surgery

## 2020-07-19 ENCOUNTER — Encounter: Payer: Self-pay | Admitting: Orthopaedic Surgery

## 2020-07-19 ENCOUNTER — Other Ambulatory Visit: Payer: Self-pay

## 2020-07-19 ENCOUNTER — Ambulatory Visit (INDEPENDENT_AMBULATORY_CARE_PROVIDER_SITE_OTHER): Payer: Medicare Other

## 2020-07-19 VITALS — BP 184/87 | HR 76 | Ht 62.0 in | Wt 110.0 lb

## 2020-07-19 DIAGNOSIS — S93325A Dislocation of tarsometatarsal joint of left foot, initial encounter: Secondary | ICD-10-CM

## 2020-07-19 NOTE — Progress Notes (Signed)
   Post-Op Visit Note   Patient: [name]           Date of Birth: Apr 20, 1946           MRN: [id number] Visit Date: 07/19/2020 PCP: Agustina Caroli, MD   Assessment & Plan: Post nondisplaced Lisfranc injury treated conservatively with casting left foot.  She is almost 3 months post injury she can begin wearing her tennis shoe progressive weightbearing as tolerated go from her walker to a cane.  She will call if she needs formal physical therapy.  I will recheck her in 2 weeks to make sure she is making progress.  She is able to stand put weight on her foot without pain.  X-rays show fracture is healed.  Chief Complaint:  Chief Complaint  Patient presents with  . Left Ankle - Fracture, Follow-up  . Left Foot - Follow-up, Fracture   Visit Diagnoses:  1. Lisfranc dislocation, left, initial encounter     Plan: Progressive weightbearing as tolerated.no xray needed on return.   Follow-Up Instructions: No follow-ups on file.   Orders:  Orders Placed This Encounter  Procedures  . XR Foot Complete Left   No orders of the defined types were placed in this encounter.   Imaging: No results found.  PMFS History: Patient Active Problem List   Diagnosis Date Noted  . Lisfranc dislocation, left, initial encounter 05/13/2020  . Lateral malleolar fracture 05/13/2020  . Hip fracture (HCC) 11/27/2015  . Preop testing   . Closed left hip fracture (HCC) 11/26/2015  . Epigastric abdominal pain 04/14/2015  . Essential (primary) hypertension 03/31/2015  . 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 Hypercholesteremia 03/31/2015  . Kidney cysts 03/31/2015   Past Medical History:  Diagnosis Date  . High cholesterol   . Hypertension     No family history on file.  Past Surgical History:  Procedure Laterality Date  . ABDOMINAL HYSTERECTOMY    . CHOLECYSTECTOMY N/A 01/21/2015   Procedure: LAPAROSCOPIC CHOLECYSTECTOMY;  Surgeon: Jimmye Norman, MD;  Location: [facility] OR;  Service: General;  Laterality:  N/A;  . INTRAMEDULLARY (IM) NAIL INTERTROCHANTERIC Left 11/27/2015   Procedure: INTRAMEDULLARY (IM) NAIL INTERTROCHANTRIC;  Surgeon: Eldred Manges, MD;  Location: [facility];  Service: Orthopedics;  Laterality: Left;  . LAPAROSCOPY N/A 04/15/2015   Procedure: LAPAROSCOPY DIAGNOSTIC ileocalpexy ;  Surgeon: Karie Soda, MD;  Location: [facility];  Service: General;  Laterality: N/A;  . SMALL INTESTINE SURGERY Right   . TONSILLECTOMY    . TUBAL LIGATION  1980   Social History   Occupational History  . Not on file  Tobacco Use  . Smoking status: Former Smoker    Quit date: 08/13/2012    Years since quitting: 7.9  . Smokeless tobacco: Never Used  Substance and Sexual Activity  . Alcohol use: Yes    Comment: 1 beer/day  . Drug use: No  . Sexual activity: Not on file

## 2020-08-02 ENCOUNTER — Ambulatory Visit: Payer: Medicare Other | Admitting: Orthopaedic Surgery

## 2020-08-09 ENCOUNTER — Ambulatory Visit (INDEPENDENT_AMBULATORY_CARE_PROVIDER_SITE_OTHER): Payer: Medicare Other

## 2020-08-09 ENCOUNTER — Other Ambulatory Visit: Payer: Self-pay

## 2020-08-09 ENCOUNTER — Encounter: Payer: Self-pay | Admitting: Orthopaedic Surgery

## 2020-08-09 ENCOUNTER — Ambulatory Visit (INDEPENDENT_AMBULATORY_CARE_PROVIDER_SITE_OTHER): Payer: Medicare Other | Admitting: Orthopaedic Surgery

## 2020-08-09 VITALS — BP 168/73 | HR 62 | Ht 62.0 in | Wt 110.0 lb

## 2020-08-09 DIAGNOSIS — M25562 Pain in left knee: Secondary | ICD-10-CM

## 2020-08-09 DIAGNOSIS — M25572 Pain in left ankle and joints of left foot: Secondary | ICD-10-CM | POA: Diagnosis not present

## 2020-08-09 NOTE — Progress Notes (Signed)
   Post-Op Visit Note   Patient: [name]           Date of Birth: 1946/04/14           MRN: [id number] Visit Date: 08/09/2020 PCP: Agustina Caroli, MD   Assessment & Plan: 74 year old female returns for follow-up Lisfranc fracture treated conservatively.  Is also had previous truck nail for left hip fracture.  She had some burning in her left foot posteriorly in the heel walking with a stiff knee gait and has had pain in her knee states at times feels like her knee wants to give way.  She is not noted any swelling or increased warmth.  Pain in her knee may be slightly more lateral than medial side.  She used 1 hydrocodone go to sleep last night.  She is not been through any physical therapy.  Chief Complaint:  Chief Complaint  Patient presents with  . Left Foot - Pain  . Left Knee - Pain   Visit Diagnoses:  1. Pain in left ankle and joints of left foot   2. Acute pain of left knee     Plan: Left physical therapy work on 9 gait sequence that she is walking with a stiff  Follow-Up Instructions: Return in about 5 weeks (around 09/13/2020).   Orders:  Orders Placed This Encounter  Procedures  . XR KNEE 3 VIEW LEFT  . XR Foot Complete Left  . Ambulatory referral to Physical Therapy   No orders of the defined types were placed in this encounter.   Imaging: No results found.  PMFS History: Patient Active Problem List   Diagnosis Date Noted  . Lisfranc dislocation, left, initial encounter 05/13/2020  . Lateral malleolar fracture 05/13/2020  . Hip fracture (HCC) 11/27/2015  . Preop testing   . Closed left hip fracture (HCC) 11/26/2015  . Epigastric abdominal pain 04/14/2015  . Essential (primary) hypertension 03/31/2015  . 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 Hypercholesteremia 03/31/2015  . Kidney cysts 03/31/2015   Past Medical History:  Diagnosis Date  . High cholesterol   . Hypertension     No family history on file.  Past Surgical History:  Procedure Laterality  Date  . ABDOMINAL HYSTERECTOMY    . CHOLECYSTECTOMY N/A 01/21/2015   Procedure: LAPAROSCOPIC CHOLECYSTECTOMY;  Surgeon: Jimmye Norman, MD;  Location: [facility] OR;  Service: General;  Laterality: N/A;  . INTRAMEDULLARY (IM) NAIL INTERTROCHANTERIC Left 11/27/2015   Procedure: INTRAMEDULLARY (IM) NAIL INTERTROCHANTRIC;  Surgeon: Eldred Manges, MD;  Location: [facility];  Service: Orthopedics;  Laterality: Left;  . LAPAROSCOPY N/A 04/15/2015   Procedure: LAPAROSCOPY DIAGNOSTIC ileocalpexy ;  Surgeon: Karie Soda, MD;  Location: [facility];  Service: General;  Laterality: N/A;  . SMALL INTESTINE SURGERY Right   . TONSILLECTOMY    . TUBAL LIGATION  1980   Social History   Occupational History  . Not on file  Tobacco Use  . Smoking status: Former Smoker    Quit date: 08/13/2012    Years since quitting: 7.9  . Smokeless tobacco: Never Used  Substance and Sexual Activity  . Alcohol use: Yes    Comment: 1 beer/day  . Drug use: No  . Sexual activity: Not on file

## 2020-08-09 NOTE — Progress Notes (Deleted)
   Office Visit Note   Patient: [name]           Date of Birth: 1945-09-29           MRN: [id number] Visit Date: 08/09/2020              Requested by: Agustina Caroli, MD No address on file PCP: Agustina Caroli, MD   Assessment & Plan: Visit Diagnoses:  1. Pain in left ankle and joints of left foot   2. Acute pain of left knee     Plan: ***  Follow-Up Instructions: Return in about 5 weeks (around 09/13/2020).   Orders:  Orders Placed This Encounter  Procedures  . XR KNEE 3 VIEW LEFT  . XR Foot Complete Left   No orders of the defined types were placed in this encounter.     Procedures: No procedures performed   Clinical Data: No additional findings.   Subjective: Chief Complaint  Patient presents with  . Left Foot - Pain  . Left Knee - Pain    HPI  Review of Systems   Objective: Vital Signs: BP (!) 168/73   Pulse 62   Ht 5\' 2"  (1.575 m)   Wt 110 lb (49.9 kg)   BMI 20.12 kg/m   Physical Exam  Ortho Exam  Specialty Comments:  No specialty comments available.  Imaging: No results found.   PMFS History: Patient Active Problem List   Diagnosis Date Noted  . Lisfranc dislocation, left, initial encounter 05/13/2020  . Lateral malleolar fracture 05/13/2020  . Hip fracture (HCC) 11/27/2015  . Preop testing   . Closed left hip fracture (HCC) 11/26/2015  . Epigastric abdominal pain 04/14/2015  . Essential (primary) hypertension 03/31/2015  . 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 Hypercholesteremia 03/31/2015  . Kidney cysts 03/31/2015   Past Medical History:  Diagnosis Date  . High cholesterol   . Hypertension     No family history on file.  Past Surgical History:  Procedure Laterality Date  . ABDOMINAL HYSTERECTOMY    . CHOLECYSTECTOMY N/A 01/21/2015   Procedure: LAPAROSCOPIC CHOLECYSTECTOMY;  Surgeon: 03/23/2015, MD;  Location: [facility] OR;  Service: General;  Laterality: N/A;  . INTRAMEDULLARY (IM) NAIL INTERTROCHANTERIC Left 11/27/2015    Procedure: INTRAMEDULLARY (IM) NAIL INTERTROCHANTRIC;  Surgeon: 11/29/2015, MD;  Location: [facility];  Service: Orthopedics;  Laterality: Left;  . LAPAROSCOPY N/A 04/15/2015   Procedure: LAPAROSCOPY DIAGNOSTIC ileocalpexy ;  Surgeon: 06/15/2015, MD;  Location: [facility];  Service: General;  Laterality: N/A;  . SMALL INTESTINE SURGERY Right   . TONSILLECTOMY    . TUBAL LIGATION  1980   Social History   Occupational History  . Not on file  Tobacco Use  . Smoking status: Former Smoker    Quit date: 08/13/2012    Years since quitting: 7.9  . Smokeless tobacco: Never Used  Substance and Sexual Activity  . Alcohol use: Yes    Comment: 1 beer/day  . Drug use: No  . Sexual activity: Not on file

## 2020-08-19 ENCOUNTER — Ambulatory Visit: Payer: Medicare Other | Admitting: Orthopaedic Surgery

## 2020-09-13 ENCOUNTER — Ambulatory Visit (INDEPENDENT_AMBULATORY_CARE_PROVIDER_SITE_OTHER): Payer: Medicare Other | Admitting: Orthopaedic Surgery

## 2020-09-13 ENCOUNTER — Encounter: Payer: Self-pay | Admitting: Orthopaedic Surgery

## 2020-09-13 ENCOUNTER — Other Ambulatory Visit: Payer: Self-pay

## 2020-09-13 VITALS — Ht 62.0 in | Wt 110.0 lb

## 2020-09-13 DIAGNOSIS — S8265XA Nondisplaced fracture of lateral malleolus of left fibula, initial encounter for closed fracture: Secondary | ICD-10-CM | POA: Diagnosis not present

## 2020-09-13 DIAGNOSIS — S93325A Dislocation of tarsometatarsal joint of left foot, initial encounter: Secondary | ICD-10-CM | POA: Diagnosis not present

## 2020-09-13 NOTE — Progress Notes (Signed)
Office Visit Note   Patient: [name]           Date of Birth: 23-Jul-1946           MRN: [id number] Visit Date: 09/13/2020              Requested by: Agustina Caroli, MD No address on file PCP: Agustina Caroli, MD   Assessment & Plan: Visit Diagnoses:  1. Lisfranc dislocation, left, initial encounter     Plan: She will finish out therapy, we will check her back again on a as needed basis .She is happy with the results of treatment.  Follow-Up Instructions: No follow-ups on file.   Orders:  No orders of the defined types were placed in this encounter.  No orders of the defined types were placed in this encounter.     Procedures: No procedures performed   Clinical Data: No additional findings.   Subjective: Chief Complaint  Patient presents with  . Left Foot - Follow-up  . Left Ankle - Follow-up    HPI patient returns for follow-up of nondisplaced lateral malleolar fracture and Lisfranc fracture dislocation.  She is having trouble walking and went to physical therapy for her left foot and states that therapy which she did not think she needed turned out to be a great thing and now she is walking wonderfully.  She has 1 final visit.  She is able ambulate in the community a rapid speed no pain as needed.  She still having slight swelling in her foot as expected.  Review of Systems 14 point update noncontributory.  Objective: Vital Signs: Ht 5\' 2"  (1.575 m)   Wt 110 lb (49.9 kg)   BMI 20.12 kg/m   Physical Exam Constitutional:      Appearance: She is well-developed.  HENT:     Head: Normocephalic.     Right Ear: External ear normal.     Left Ear: External ear normal.  Eyes:     Pupils: Pupils are equal, round, and reactive to light.  Neck:     Thyroid: No thyromegaly.     Trachea: No tracheal deviation.  Cardiovascular:     Rate and Rhythm: Normal rate.  Pulmonary:     Effort: Pulmonary effort is normal.  Abdominal:     Palpations:  Abdomen is soft.  Skin:    General: Skin is warm and dry.  Neurological:     Mental Status: She is alert and oriented to person, place, and time.  Psychiatric:        Mood and Affect: Mood and affect normal.        Behavior: Behavior normal.     Ortho Exam normal heel toe gait normal stride length she gets rapidly from sitting to standing no limping.  Good ankle range of motion.  Slight swelling over the midfoot.  Specialty Comments:  No specialty comments available.  Imaging: No results found.   PMFS History: Patient Active Problem List   Diagnosis Date Noted  . Lisfranc dislocation, left, initial encounter 05/13/2020  . Lateral malleolar fracture 05/13/2020  . Hip fracture (HCC) 11/27/2015  . Preop testing   . Closed left hip fracture (HCC) 11/26/2015  . Epigastric abdominal pain 04/14/2015  . Essential (primary) hypertension 03/31/2015  . 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 Hypercholesteremia 03/31/2015  . Kidney cysts 03/31/2015   Past Medical History:  Diagnosis Date  . High cholesterol   . Hypertension     No family history on  file.  Past Surgical History:  Procedure Laterality Date  . ABDOMINAL HYSTERECTOMY    . CHOLECYSTECTOMY N/A 01/21/2015   Procedure: LAPAROSCOPIC CHOLECYSTECTOMY;  Surgeon: Jimmye Norman, MD;  Location: [facility] OR;  Service: General;  Laterality: N/A;  . INTRAMEDULLARY (IM) NAIL INTERTROCHANTERIC Left 11/27/2015   Procedure: INTRAMEDULLARY (IM) NAIL INTERTROCHANTRIC;  Surgeon: Eldred Manges, MD;  Location: [facility];  Service: Orthopedics;  Laterality: Left;  . LAPAROSCOPY N/A 04/15/2015   Procedure: LAPAROSCOPY DIAGNOSTIC ileocalpexy ;  Surgeon: Karie Soda, MD;  Location: [facility];  Service: General;  Laterality: N/A;  . SMALL INTESTINE SURGERY Right   . TONSILLECTOMY    . TUBAL LIGATION  1980   Social History   Occupational History  . Not on file  Tobacco Use  . Smoking status: Former Smoker    Quit date: 08/13/2012    Years since quitting: 8.0  .  Smokeless tobacco: Never Used  Substance and Sexual Activity  . Alcohol use: Yes    Comment: 1 beer/day  . Drug use: No  . Sexual activity: Not on file

## 2021-08-03 ENCOUNTER — Ambulatory Visit (INDEPENDENT_AMBULATORY_CARE_PROVIDER_SITE_OTHER): Payer: Medicare Other | Admitting: Family

## 2021-08-03 ENCOUNTER — Encounter: Payer: Self-pay | Admitting: Family

## 2021-08-03 VITALS — BP 152/60 | HR 74 | Temp 98.0°F | Ht 62.0 in | Wt 111.4 lb

## 2021-08-03 DIAGNOSIS — E785 Hyperlipidemia, unspecified: Secondary | ICD-10-CM | POA: Diagnosis not present

## 2021-08-03 DIAGNOSIS — I10 Essential (primary) hypertension: Secondary | ICD-10-CM

## 2021-08-03 DIAGNOSIS — Z1211 Encounter for screening for malignant neoplasm of colon: Secondary | ICD-10-CM

## 2021-08-03 DIAGNOSIS — R7303 Prediabetes: Secondary | ICD-10-CM | POA: Diagnosis not present

## 2021-08-03 DIAGNOSIS — Z87891 Personal history of nicotine dependence: Secondary | ICD-10-CM

## 2021-08-03 NOTE — Progress Notes (Signed)
[name] is a 75 y.o. 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 Swallow whole.     ergocalciferol (VITAMIN D2) 1.25 MG (50000 UT) capsule Take 50,000 Units by mouth once a week.     pravastatin (PRAVACHOL) 80 MG tablet Take 80 mg by mouth daily.      No current facility-administered medications for this visit.    Allergies: Avelox [moxifloxacin], Penicillins, Buprenorphine hcl, Morphine and related, Procaine, Sulfa antibiotics, Sulfacetamide sodium, and Aspirin  Past Medical History:  Diagnosis Date   High cholesterol    Hypertension     Past Surgical History:  Procedure Laterality Date   ABDOMINAL HYSTERECTOMY     CHOLECYSTECTOMY N/A 01/21/2015   Procedure: LAPAROSCOPIC CHOLECYSTECTOMY;  Surgeon: Jimmye Norman, MD;  Location: [facility] OR;  Service: General;  Laterality: N/A;   INTRAMEDULLARY (IM) NAIL INTERTROCHANTERIC Left 11/27/2015   Procedure: INTRAMEDULLARY (IM) NAIL INTERTROCHANTRIC;  Surgeon: Eldred Manges, MD;  Location: [facility];  Service: Orthopedics;  Laterality: Left;   LAPAROSCOPY N/A 04/15/2015   Procedure: LAPAROSCOPY DIAGNOSTIC ileocalpexy ;  Surgeon: Karie Soda, MD;  Location: [facility];  Service: General;  Laterality: N/A;   SMALL INTESTINE SURGERY Right    TONSILLECTOMY     TUBAL LIGATION  1980    No family history on file.  Social History   Tobacco Use   Smoking status: Former     Types: Cigarettes    Quit date: 08/13/2012    Years since quitting: 8.9   Smokeless tobacco: Never  Substance Use Topics   Alcohol use: Yes    Comment: 1 beer/day    Subjective:  Presents today as new patient; transferring from [facility];  History of hypertension, hyperlipidemia, pre-diabetes; last CPE/ labs done in May 2022; Does have history of hypertension- did not take her medication this morning and admits she was stressed driving here; home blood pressure readings average 132-142/70-75; Denies any chest pain, shortness of breath, blurred vision or headache Does not want to do mammogram; agrees to consider Cologuard- refuses colonoscopy; Defers any type of vaccine;  Under care of retina specialist and will be having right lid ptosis surgery later in 2023;     Objective:  Vitals:   08/03/21 0845 08/03/21 0920  BP: (!) 160/70 (!) 152/60  Pulse: 74   Temp: 98 F (36.7 C)   TempSrc: Oral   SpO2: 97%   Weight: 111 lb 6.4 oz (50.5 kg)   Height: 5\' 2"  (1.575 m)     General: Well developed, well nourished, in no acute distress  Skin : Warm and dry.  Head: Normocephalic and atraumatic  Eyes: Sclera and conjunctiva clear; pupils round and reactive to light; extraocular movements intact  Ears: External normal; canals clear; tympanic membranes normal  Oropharynx: Pink, supple. No suspicious lesions  Neck: Supple without thyromegaly, adenopathy  Lungs: Respirations unlabored; clear to auscultation bilaterally without wheeze,  rales, rhonchi  CVS exam: normal rate and regular rhythm.  Abdomen: Soft; nontender; nondistended; normoactive bowel sounds; no masses or hepatosplenomegaly  Musculoskeletal: No deformities; no active joint inflammation  Extremities: No edema, cyanosis, clubbing  Vessels: Symmetric bilaterally  Neurologic: Alert and oriented; speech intact; face symmetrical; moves all extremities well; CNII-XII intact without focal deficit   Assessment:  1. Essential  (primary) hypertension   2. Hyperlipidemia, unspecified hyperlipidemia type   3. Pre-diabetes   4. Screening for colon cancer   5. History of tobacco abuse     Plan:  Patient did not take her medication this morning; per patient, well controlled at home; continue to monitor and take medication daily; re-check at next OV; Plan for labs at next OV; continue same medication;  Hgba1c was 5.8 in May 2022; Order updated for Cologuard; Discussed lung cancer CT and patient refuses at this time;  Patient refuses mammogram and vaccines today;   Follow up in 5 months, sooner prn for CPE;   Return in about 5 months (around 01/01/2022).  Orders Placed This Encounter  Procedures   Cologuard    Requested Prescriptions    No prescriptions requested or ordered in this encounter

## 2021-08-17 ENCOUNTER — Emergency Department (HOSPITAL_BASED_OUTPATIENT_CLINIC_OR_DEPARTMENT_OTHER)
Admission: EM | Admit: 2021-08-17 | Discharge: 2021-08-17 | Disposition: A | Discharge status: Home / Self Care | Payer: Medicare Other | Attending: Emergency Medicine | Admitting: Emergency Medicine

## 2021-08-17 ENCOUNTER — Encounter (HOSPITAL_BASED_OUTPATIENT_CLINIC_OR_DEPARTMENT_OTHER): Payer: Self-pay | Admitting: Emergency Medicine

## 2021-08-17 ENCOUNTER — Other Ambulatory Visit: Payer: Self-pay

## 2021-08-17 ENCOUNTER — Emergency Department (HOSPITAL_BASED_OUTPATIENT_CLINIC_OR_DEPARTMENT_OTHER): Payer: Medicare Other

## 2021-08-17 DIAGNOSIS — J181 Lobar pneumonia, unspecified organism: Secondary | ICD-10-CM | POA: Diagnosis not present

## 2021-08-17 DIAGNOSIS — J189 Pneumonia, unspecified organism: Secondary | ICD-10-CM

## 2021-08-17 DIAGNOSIS — R0781 Pleurodynia: Secondary | ICD-10-CM | POA: Diagnosis present

## 2021-08-17 LAB — BASIC METABOLIC PANEL
Anion gap: 12 (ref 5–15)
BUN: 9 mg/dL (ref 8–23)
CO2: 26 mmol/L (ref 22–32)
Calcium: 9.5 mg/dL (ref 8.9–10.3)
Chloride: 97 mmol/L — ABNORMAL LOW (ref 98–111)
Creatinine, Ser: 0.77 mg/dL (ref 0.44–1.00)
GFR, Estimated: >60 mL/min (ref >60)
Glucose, Bld: 106 mg/dL — ABNORMAL HIGH (ref 70–99)
Potassium: 3.3 mmol/L — ABNORMAL LOW (ref 3.5–5.1)
Sodium: 135 mmol/L (ref 135–145)

## 2021-08-17 LAB — CBC WITH DIFFERENTIAL/PLATELET
Abs Immature Granulocytes: 0.04 10*3/uL (ref 0.00–0.07)
Basophils Absolute: 0 10*3/uL (ref 0.0–0.1)
Basophils Relative: 0 %
Eosinophils Absolute: 0.2 10*3/uL (ref 0.0–0.5)
Eosinophils Relative: 2 %
HCT: 39.3 % (ref 36.0–46.0)
Hemoglobin: 13.3 g/dL (ref 12.0–15.0)
Immature Granulocytes: 0 %
Lymphocytes Relative: 12 %
Lymphs Abs: 1.2 10*3/uL (ref 0.7–4.0)
MCH: 31.4 pg (ref 26.0–34.0)
MCHC: 33.8 g/dL (ref 30.0–36.0)
MCV: 92.9 fL (ref 80.0–100.0)
Monocytes Absolute: 0.8 10*3/uL (ref 0.1–1.0)
Monocytes Relative: 8 %
Neutro Abs: 8 10*3/uL — ABNORMAL HIGH (ref 1.7–7.7)
Neutrophils Relative %: 78 %
Platelets: 262 10*3/uL (ref 150–400)
RBC: 4.23 MIL/uL (ref 3.87–5.11)
RDW: 12 % (ref 11.5–15.5)
WBC: 10.3 10*3/uL (ref 4.0–10.5)
nRBC: 0 % (ref 0.0–0.2)

## 2021-08-17 LAB — TROPONIN I (HIGH SENSITIVITY): Troponin I (High Sensitivity): 7 ng/L (ref <18)

## 2021-08-17 MED ORDER — DOXYCYCLINE HYCLATE 100 MG PO TABS
100.0000 mg | ORAL_TABLET | Freq: Once | ORAL | Status: AC
  Administered 2021-08-17: 100 mg via ORAL
  Filled 2021-08-17: qty 1

## 2021-08-17 MED ORDER — IOHEXOL 350 MG/ML SOLN
75.0000 mL | Freq: Once | INTRAVENOUS | Status: AC | PRN
  Administered 2021-08-17: 75 mL via INTRAVENOUS

## 2021-08-17 MED ORDER — DOXYCYCLINE HYCLATE 100 MG PO CAPS: 100.0000 mg | ORAL_CAPSULE | Freq: Two times a day (BID) | ORAL | 0 refills | Status: DC

## 2021-08-17 NOTE — ED Provider Notes (Addendum)
[facility] EMERGENCY DEPARTMENT Provider Note    CSN: [id number] Arrival date & time: 08/17/21 1554  History Chief Complaint  Patient presents with   Chest Pain    [name] is a 76 y.o. female with remote history of tobacco use reports she had an episode of dry heaves 3 days ago that has since turned into productive cough and pleuritic R upper chest pain. No fever. Does not feel SOB. She was seen at [facility] today where Covid and Flu tests were negative, but CXR showed RUL mass/consolidation. Sent to the ED for further evaluation.    Home Medications Prior to Admission medications   Medication Sig Start Date End Date Taking? Authorizing Provider  doxycycline (VIBRAMYCIN) 100 MG capsule Take 1 capsule (100 mg total) by mouth 2 (two) times daily. 08/17/21  Yes Truddie Hidden, MD  amLODipine (NORVASC) 5 MG tablet  07/08/20   [provider]  aspirin EC 81 MG tablet Take 81 mg by mouth daily. Swallow whole.    [provider]  ergocalciferol (VITAMIN D2) 1.25 MG (50000 UT) capsule Take 50,000 Units by mouth once a week.    [provider]  pravastatin (PRAVACHOL) 80 MG tablet Take 80 mg by mouth daily.  09/29/15   [provider]     Allergies    Avelox [moxifloxacin], Penicillins, Buprenorphine hcl, Morphine and related, Procaine, Sulfa antibiotics, Sulfacetamide sodium, and Aspirin   Review of Systems   Review of Systems Please see HPI for pertinent positives and negatives  Physical Exam BP (!) 169/86 (BP Location: Right Arm)    Pulse 91    Temp 99.4 F (37.4 C) (Oral)    Resp 18    Ht 5\' 2"  (1.575 m)    Wt 50.5 kg    SpO2 96%    BMI 20.36 kg/m   Physical Exam Vitals and nursing note reviewed.  Constitutional:      Appearance: Normal appearance.  HENT:     Head: Normocephalic and atraumatic.     Nose: Nose normal.     Mouth/Throat:     Mouth: Mucous membranes are moist.  Eyes:     Extraocular Movements: Extraocular  movements intact.     Conjunctiva/sclera: Conjunctivae normal.  Cardiovascular:     Rate and Rhythm: Normal rate.  Pulmonary:     Effort: Pulmonary effort is normal.     Breath sounds: Normal breath sounds. No decreased breath sounds, wheezing, rhonchi or rales.  Abdominal:     General: Abdomen is flat.     Palpations: Abdomen is soft.     Tenderness: There is no abdominal tenderness.  Musculoskeletal:        General: No swelling. Normal range of motion.     Cervical back: Neck supple.     Right lower leg: No edema.     Left lower leg: No edema.  Skin:    General: Skin is warm and dry.  Neurological:     General: No focal deficit present.     Mental Status: She is alert.  Psychiatric:        Mood and Affect: Mood normal.    ED Results / Procedures / Treatments   EKG EKG Interpretation  Date/Time:  Thursday August 17 2021 16:07:53 EST Ventricular Rate:  94 PR Interval:  146 QRS Duration: 84 QT Interval:  368 QTC Calculation: 460 R Axis:   -7 Text Interpretation: Normal sinus rhythm Possible Left atrial enlargement Nonspecific ST and T  wave abnormality Abnormal ECG No significant change since last tracing Confirmed by Calvert Cantor [phone]) on 08/17/2021 4:38:24 PM  Procedures Procedures  Medications Ordered in the ED Medications  doxycycline (VIBRA-TABS) tablet 100 mg (has no administration in time range)  iohexol (OMNIPAQUE) 350 MG/ML injection 75 mL (75 mLs Intravenous Contrast Given 08/17/21 1735)    Initial Impression and Plan  Patient with abnormal outpatient CXR, concerning for mass, PNA or PE. Will check labs, send for CTA. She is otherwise well appearing without hypoxia or hypotension.   ED Course   Clinical Course as of 08/17/21 1832  Thu Aug 17, 2021  1803 CBC is normal. BMP is unremarkable. Trop is neg. No need for delta given duration of her symptoms.  [CS]  [id number] CT images and results reviewed, most consistent with infectious etiology although  underlying mass cannot be excluded. Will give a dose of Abx for CAP, Anticipate discharge home with outpatient follow up if continues to do well.  [CS]    Clinical Course User Index [CS] Truddie Hidden, MD     MDM Rules/Calculators/A&P Medical Decision Making Problems Addressed: Community acquired pneumonia of right upper lobe of lung: acute illness or injury with systemic symptoms  Amount and/or Complexity of Data Reviewed External Data Reviewed: radiology and notes. Labs: ordered. Decision-making details documented in ED Course. Radiology: ordered and independent interpretation performed. Decision-making details documented in ED Course. ECG/medicine tests: ordered and independent interpretation performed. Decision-making details documented in ED Course.  Risk Prescription drug management.    Final Clinical Impression(s) / ED Diagnoses Final diagnoses:  Community acquired pneumonia of right upper lobe of lung    Rx / DC Orders ED Discharge Orders          Ordered    doxycycline (VIBRAMYCIN) 100 MG capsule  2 times daily        08/17/21 1831               Truddie Hidden, MD 08/17/21 [phone]

## 2021-08-17 NOTE — ED Triage Notes (Signed)
Reports right sided chest pain that started on Monday.  Seen at [facility].  They did a chest xray.  Told her to come directly to the ED.  Hinhomogeneous opacity in right lung apex per xray.

## 2021-08-17 NOTE — ED Notes (Signed)
Pt returned from CT °

## 2021-08-17 NOTE — ED Notes (Signed)
Patient transported to CT. Unable to obtain vitals at this time.

## 2021-08-17 NOTE — ED Notes (Signed)
Patient transported to CT 

## 2021-08-24 LAB — COLOGUARD: COLOGUARD: NEGATIVE

## 2021-08-31 ENCOUNTER — Encounter: Payer: Self-pay | Admitting: Family

## 2021-08-31 ENCOUNTER — Ambulatory Visit (INDEPENDENT_AMBULATORY_CARE_PROVIDER_SITE_OTHER): Payer: Medicare Other | Admitting: Family

## 2021-08-31 VITALS — BP 146/80 | HR 71 | Temp 98.2°F | Ht 62.0 in | Wt 109.4 lb

## 2021-08-31 DIAGNOSIS — R9389 Abnormal findings on diagnostic imaging of other specified body structures: Secondary | ICD-10-CM

## 2021-08-31 DIAGNOSIS — I10 Essential (primary) hypertension: Secondary | ICD-10-CM | POA: Diagnosis not present

## 2021-08-31 MED ORDER — AMLODIPINE BESYLATE 2.5 MG PO TABS: 2.5000 mg | ORAL_TABLET | Freq: Every day | ORAL | 0 refills | Status: DC

## 2021-08-31 NOTE — Progress Notes (Signed)
[name] is a 76 y.o. female with the following history as recorded in EpicCare:  Patient Active Problem List   Diagnosis Date Noted   Lisfranc dislocation, left, initial encounter 05/13/2020   Lateral malleolar fracture 05/13/2020   Hip fracture ([place]) 11/27/2015   Preop testing    Closed left hip fracture ([place]) 11/26/2015   Epigastric abdominal pain 04/14/2015   Essential (primary) hypertension 03/31/2015   Hepatic cyst 03/31/2015   Hypercholesteremia 03/31/2015   Kidney cysts 03/31/2015    Current Outpatient Medications  Medication Sig Dispense Refill   amLODipine (NORVASC) 2.5 MG tablet Take 1 tablet (2.5 mg total) by mouth daily. 30 tablet 0   aspirin EC 81 MG tablet Take 81 mg by mouth daily. Swallow whole.     ergocalciferol (VITAMIN D2) 1.25 MG (50000 UT) capsule Take 50,000 Units by mouth once a week.     pravastatin (PRAVACHOL) 80 MG tablet Take 80 mg by mouth daily.      doxycycline (VIBRAMYCIN) 100 MG capsule Take 1 capsule (100 mg total) by mouth 2 (two) times daily. (Patient not taking: Reported on 08/31/2021) 20 capsule 0   No current facility-administered medications for this visit.    Allergies: Avelox [moxifloxacin], Penicillins, Buprenorphine hcl, Morphine and related, Procaine, Sulfa antibiotics, Sulfacetamide sodium, and Aspirin  Past Medical History:  Diagnosis Date   High cholesterol    Hypertension     Past Surgical History:  Procedure Laterality Date   ABDOMINAL HYSTERECTOMY     CHOLECYSTECTOMY N/A 01/21/2015   Procedure: LAPAROSCOPIC CHOLECYSTECTOMY;  Surgeon: Judeth Horn, MD;  Location: [facility];  Service: General;  Laterality: N/A;   INTRAMEDULLARY (IM) NAIL INTERTROCHANTERIC Left 11/27/2015   Procedure: INTRAMEDULLARY (IM) NAIL INTERTROCHANTRIC;  Surgeon: Marybelle Killings, MD;  Location: [facility];  Service: Orthopedics;  Laterality: Left;   LAPAROSCOPY N/A 04/15/2015   Procedure: LAPAROSCOPY DIAGNOSTIC ileocalpexy ;  Surgeon: Michael Boston, MD;  Location:  [facility];  Service: General;  Laterality: N/A;   SMALL INTESTINE SURGERY Right    TONSILLECTOMY     TUBAL LIGATION  1980    No family history on file.  Social History   Tobacco Use   Smoking status: Former    Types: Cigarettes    Quit date: 08/13/2012    Years since quitting: 9.0   Smokeless tobacco: Never  Substance Use Topics   Alcohol use: Yes    Comment: 1 beer/day    Subjective:  Seen 2 weeks ago in ER with chest pain- diagnosed with RUL pneumonia; treated with 10 days of Doxycycline; was instructed to follow up on imaging to get repeat chest CT updated when treatment completed; notes she is feeling better today;   Also notes that she has not been taking her blood pressure medication regularly; she feels like she gets more tired and sluggish after she takes this medication for extended period; home reading on 1/18 was 133/68 and she notes she had taken medication for 2 out of 4 days prior.      Objective:  Vitals:   08/31/21 0923  BP: (!) 146/80  Pulse: 71  Temp: 98.2 F (36.8 C)  TempSrc: Oral  SpO2: 97%  Weight: 109 lb 6.4 oz (49.6 kg)  Height: 5\' 2"  (1.575 m)    General: Well developed, well nourished, in no acute distress  Skin : Warm and dry.  Head: Normocephalic and atraumatic  Eyes: Sclera and conjunctiva clear; pupils round and reactive to light; extraocular movements intact  Ears: External  normal; canals clear; tympanic membranes normal  Oropharynx: Pink, supple. No suspicious lesions  Neck: Supple without thyromegaly, adenopathy  Lungs: Respirations unlabored; clear to auscultation bilaterally without wheeze, rales, rhonchi  CVS exam: normal rate and regular rhythm.  Neurologic: Alert and oriented; speech intact; face symmetrical; moves all extremities well; CNII-XII intact without focal deficit   Assessment:  1. Abnormal chest CT   2. Essential (primary) hypertension     Plan:   Reviewed ER notes/ radiology notes; will order chest CT with contrast-  follow up to be determined; Patient is not taking her medication regularly due to fatigue; will try cutting dosage to 2.5 mg and she agrees to take daily; she will call back with response in 1-2 weeks;   This visit occurred during the SARS-CoV-2 public health emergency.  Safety protocols were in place, including screening questions prior to the visit, additional usage of staff PPE, and extensive cleaning of exam room while observing appropriate contact time as indicated for disinfecting solutions.     No follow-ups on file.  Orders Placed This Encounter  Procedures   CT Chest W Contrast    Standing Status:   Future    Standing Expiration Date:   08/31/2022    Order Specific Question:   If indicated for the ordered procedure, I authorize the administration of contrast media per Radiology protocol    Answer:   Yes    Order Specific Question:   Preferred imaging location?    Answer:   [facility]    Requested Prescriptions   Signed Prescriptions Disp Refills   amLODipine (NORVASC) 2.5 MG tablet 30 tablet 0    Sig: Take 1 tablet (2.5 mg total) by mouth daily.

## 2021-09-05 ENCOUNTER — Ambulatory Visit (HOSPITAL_BASED_OUTPATIENT_CLINIC_OR_DEPARTMENT_OTHER)
Admission: RE | Admit: 2021-09-05 | Discharge: 2021-09-05 | Disposition: A | Discharge status: Home / Self Care | Payer: Medicare Other | Source: Ambulatory Visit | Attending: Family | Admitting: Family

## 2021-09-05 ENCOUNTER — Encounter (HOSPITAL_BASED_OUTPATIENT_CLINIC_OR_DEPARTMENT_OTHER): Payer: Self-pay

## 2021-09-05 ENCOUNTER — Other Ambulatory Visit: Payer: Self-pay

## 2021-09-05 DIAGNOSIS — R9389 Abnormal findings on diagnostic imaging of other specified body structures: Secondary | ICD-10-CM | POA: Diagnosis present

## 2021-09-05 MED ORDER — IOHEXOL 300 MG/ML  SOLN
100.0000 mL | Freq: Once | INTRAMUSCULAR | Status: AC | PRN
  Administered 2021-09-05: 09:00:00 80 mL via INTRAVENOUS

## 2021-09-06 ENCOUNTER — Other Ambulatory Visit: Payer: Self-pay | Admitting: Family

## 2021-09-06 ENCOUNTER — Telehealth: Payer: Self-pay | Admitting: Family

## 2021-09-06 ENCOUNTER — Encounter: Payer: Self-pay | Admitting: Family

## 2021-09-06 DIAGNOSIS — I2510 Atherosclerotic heart disease of native coronary artery without angina pectoris: Secondary | ICD-10-CM

## 2021-09-06 MED ORDER — DOXYCYCLINE HYCLATE 100 MG PO CAPS: 100.0000 mg | ORAL_CAPSULE | Freq: Two times a day (BID) | ORAL | 0 refills | Status: DC

## 2021-09-06 NOTE — Telephone Encounter (Signed)
Pt is confused as to why she had a referral placed for cardiology. She would like a call back explaining this so she can decide to schedule with them or not. Please advise.

## 2021-09-07 NOTE — Telephone Encounter (Signed)
I have tried to call pt back to answer her questions and relay some results.   There was no answer so I left a message to call back.   "Concern for residual pneumonia- I am going to extend the antibiotics for 7 more days. We will repeat the CT in 3 months to ensure stability. We will contact her to schedule.  She has moderate emphysema/ COPD changes. I would like to have her daily inhaler- is she okay with this?  I am also concerned about possible heart disease and would like to have her meet with cardiology. Stay on the Pravachol for sure but I want to make sure we are doing everything we can to prevent any type of cardiac event. "

## 2021-09-07 NOTE — Telephone Encounter (Signed)
I have called pt back and clarified the information. She stated understanding and will not do the inhaler.

## 2021-09-15 ENCOUNTER — Ambulatory Visit (INDEPENDENT_AMBULATORY_CARE_PROVIDER_SITE_OTHER): Payer: Medicare Other | Admitting: Cardiology

## 2021-09-15 ENCOUNTER — Other Ambulatory Visit: Payer: Self-pay

## 2021-09-15 ENCOUNTER — Encounter: Payer: Self-pay | Admitting: Cardiology

## 2021-09-15 VITALS — BP 160/80 | HR 71 | Ht 62.0 in | Wt 108.0 lb

## 2021-09-15 DIAGNOSIS — I2584 Coronary atherosclerosis due to calcified coronary lesion: Secondary | ICD-10-CM

## 2021-09-15 DIAGNOSIS — I10 Essential (primary) hypertension: Secondary | ICD-10-CM

## 2021-09-15 DIAGNOSIS — I2510 Atherosclerotic heart disease of native coronary artery without angina pectoris: Secondary | ICD-10-CM

## 2021-09-15 DIAGNOSIS — E7800 Pure hypercholesterolemia, unspecified: Secondary | ICD-10-CM | POA: Diagnosis not present

## 2021-09-15 DIAGNOSIS — R079 Chest pain, unspecified: Secondary | ICD-10-CM

## 2021-09-15 DIAGNOSIS — I700 Atherosclerosis of aorta: Secondary | ICD-10-CM

## 2021-09-15 DIAGNOSIS — F17200 Nicotine dependence, unspecified, uncomplicated: Secondary | ICD-10-CM | POA: Insufficient documentation

## 2021-09-15 HISTORY — DX: Atherosclerosis of aorta: I70.0

## 2021-09-15 HISTORY — DX: Atherosclerotic heart disease of native coronary artery without angina pectoris: I25.10

## 2021-09-15 NOTE — Progress Notes (Signed)
Cardiology Consultation:    Date:  09/15/2021   ID:  [name], DOB 08-01-1946, MRN [id number]  PCP:  Olive Bass, FNP  Cardiologist:  Gypsy Balsam, MD   Referring MD: Olive Bass,*   Chief Complaint  Patient presents with   Coronary Artery Disease    PCP want pt to be eval due to abnormal CT    History of Present Illness:    [name] is a 76 y.o. female who is being seen today for the evaluation of calcification of the coronary artery at the request of Olive Bass,*.  With past medical history significant for essential hypertension dyslipidemia history of smoking, COPD.  Recently she had a CT of her chest done she was found to have severe calcification of the coronary arteries as well as aorta.  She comes here to be evaluated for this.  She is a sweet lady denies have any chest pain tightness squeezing pressure in the chest.  She claimed that she is able to walk and climb stairs with no difficulty but looking at her look like she is tiny lady with some emphysema and I think her ability to exercise probably limited because of lung condition rather than because of heart.  She quit smoking years ago she does not have family history of premature coronary artery disease, she does not exercise on the regular basis.  Past Medical History:  Diagnosis Date   High cholesterol    Hypertension     Past Surgical History:  Procedure Laterality Date   ABDOMINAL HYSTERECTOMY     CHOLECYSTECTOMY N/A 01/21/2015   Procedure: LAPAROSCOPIC CHOLECYSTECTOMY;  Surgeon: Jimmye Norman, MD;  Location: [facility];  Service: General;  Laterality: N/A;   INTRAMEDULLARY (IM) NAIL INTERTROCHANTERIC Left 11/27/2015   Procedure: INTRAMEDULLARY (IM) NAIL INTERTROCHANTRIC;  Surgeon: Eldred Manges, MD;  Location: [facility];  Service: Orthopedics;  Laterality: Left;   LAPAROSCOPY N/A 04/15/2015   Procedure: LAPAROSCOPY DIAGNOSTIC ileocalpexy ;  Surgeon: Karie Soda, MD;   Location: [facility];  Service: General;  Laterality: N/A;   SMALL INTESTINE SURGERY Right    TONSILLECTOMY     TUBAL LIGATION  1980    Current Medications: Current Meds  Medication Sig   amLODipine (NORVASC) 2.5 MG tablet Take 1 tablet (2.5 mg total) by mouth daily.   aspirin EC 81 MG tablet Take 81 mg by mouth daily. Swallow whole.   ergocalciferol (VITAMIN D2) 1.25 MG (50000 UT) capsule Take 50,000 Units by mouth once a week.   pravastatin (PRAVACHOL) 80 MG tablet Take 80 mg by mouth daily.      Allergies:   Avelox [moxifloxacin], Penicillins, Buprenorphine hcl, Morphine and related, Nitrofuran derivatives, Other, Procaine, Sulfa antibiotics, Sulfacetamide sodium, and Aspirin   Social History   Socioeconomic History   Marital status: Married    Spouse name: Not on file   Number of children: Not on file   Years of education: Not on file   Highest education level: Not on file  Occupational History   Not on file  Tobacco Use   Smoking status: Former    Types: Cigarettes    Quit date: 08/13/2012    Years since quitting: 9.0   Smokeless tobacco: Never  Substance and Sexual Activity   Alcohol use: Yes    Comment: 1 beer/day   Drug use: No   Sexual activity: Not on file  Other Topics Concern   Not on file  Social History Narrative  Not on file   Social Determinants of Health   Financial Resource Strain: Not on file  Food Insecurity: Not on file  Transportation Needs: Not on file  Physical Activity: Not on file  Stress: Not on file  Social Connections: Not on file     Family History: The patient's family history is not on file. ROS:   Please see the history of present illness.    All 14 point review of systems negative except as described per history of present illness.  EKGs/Labs/Other Studies Reviewed:    The following studies were reviewed today:   EKG:  EKG is  ordered today.  The ekg ordered today demonstrates normal sinus rhythm normal P interval possible  left atrial enlargement no ST segment changes  Recent Labs: 08/17/2021: BUN 9; Creatinine, Ser 0.77; Hemoglobin 13.3; Platelets 262; Potassium 3.3; Sodium 135  Recent Lipid Panel No results found for: CHOL, TRIG, HDL, CHOLHDL, VLDL, LDLCALC, LDLDIRECT  Physical Exam:    VS:  BP (!) 160/80 (BP Location: Left Arm, Patient Position: Sitting)    Pulse 71    Ht 5\' 2"  (1.575 m)    Wt 108 lb (49 kg)    SpO2 94%    BMI 19.75 kg/m     Wt Readings from Last 3 Encounters:  09/15/21 108 lb (49 kg)  08/31/21 109 lb 6.4 oz (49.6 kg)  08/17/21 111 lb 5.3 oz (50.5 kg)     GEN:  Well nourished, well developed in no acute distress HEENT: Normal NECK: No JVD; No carotid bruits LYMPHATICS: No lymphadenopathy CARDIAC: RRR, no murmurs, no rubs, no gallops RESPIRATORY:  Clear to auscultation without rales, wheezing or rhonchi  ABDOMEN: Soft, non-tender, non-distended MUSCULOSKELETAL:  No edema; No deformity  SKIN: Warm and dry NEUROLOGIC:  Alert and oriented x 3 PSYCHIATRIC:  Normal affect   ASSESSMENT:    1. Essential (primary) hypertension   2. Calcification of coronary artery   3. Aortic atherosclerosis (HCC)   4. Hypercholesteremia   5. Smoking    PLAN:    In order of problems listed above:  Calcification of the coronary artery.  We will schedule her to have Lexiscan to make sure she does not have any significant obstructive coronary artery disease.  She is already on aspirin which I will continue. Dyspnea on exertion which is probably related to her lung condition likely on the physical exam I do not hear any wheezes but chest x-ray/CT confirmed presence of emphysema already.  So clearly she does have COPD.  I will schedule her to have echocardiogram to make sure she does not have significant cardiomyopathy this is possible for dyspnea on exertion Atherosclerosis she is already on antiplatelet therapy, she also take pravastatin.  We will check cholesterol today to see if treatment is  sufficient History of smoking she quit years ago.  I encouraged her to stay from smoking Because of advanced atherosclerosis in her aorta and coronary arteries we will schedule her to have carotic ultrasound   Medication Adjustments/Labs and Tests Ordered: Current medicines are reviewed at length with the patient today.  Concerns regarding medicines are outlined above.  No orders of the defined types were placed in this encounter.  No orders of the defined types were placed in this encounter.   Signed, 10/15/21, MD, [facility] 09/15/2021 11:03 AM    [facility]

## 2021-09-15 NOTE — Patient Instructions (Signed)
Medication Instructions:  Your physician recommends that you continue on your current medications as directed. Please refer to the Current Medication list given to you today.  *If you need a refill on your cardiac medications before your next appointment, please call your pharmacy*   Lab Work: Your physician recommends that you return for lab work in: Labs today: Direct LDL If you have labs (blood work) drawn today and your tests are completely normal, you will receive your results only by: MyChart Message (if you have MyChart) OR A paper copy in the mail If you have any lab test that is abnormal or we need to change your treatment, we will call you to review the results.   Testing/Procedures: Your physician has requested that you have an echocardiogram. Echocardiography is a painless test that uses sound waves to create images of your heart. It provides your doctor with information about the size and shape of your heart and how well your hearts chambers and valves are working. This procedure takes approximately one hour. There are no restrictions for this procedure.  Your physician has requested that you have a carotid duplex. This test is an ultrasound of the carotid arteries in your neck. It looks at blood flow through these arteries that supply the brain with blood. Allow one hour for this exam. There are no restrictions or special instructions.    [facility] at [facility] [address], [address] [place], [place] [address] Phone: [phone]    Please arrive 15 minutes prior to your appointment time for registration and insurance purposes.  The test will take approximately 3 to 4 hours to complete; you may bring reading material.  If someone comes with you to your appointment, they will need to remain in the main lobby due to limited space in the testing area. **If you are pregnant or breastfeeding, please notify the nuclear lab prior to your  appointment**  How to prepare for your Myocardial Perfusion Test: Do not eat or drink 3 hours prior to your test, except you may have water. Do not consume products containing caffeine (regular or decaffeinated) 12 hours prior to your test. (ex: coffee, chocolate, sodas, tea). Do bring a list of your current medications with you.  If not listed below, you may take your medications as normal. Do wear comfortable clothes (no dresses or overalls) and walking shoes, tennis shoes preferred (No heels or open toe shoes are allowed). Do NOT wear cologne, perfume, aftershave, or lotions (deodorant is allowed). If these instructions are not followed, your test will have to be rescheduled.  Please report to [address] for your test.  If you have questions or concerns about your appointment, you can call the Nuclear Lab at [phone].  If you cannot keep your appointment, please provide 24 hours notification to the Nuclear Lab, to avoid a possible $50 charge to your account.    Follow-Up: At [facility], you and your health needs are our priority.  As part of our continuing mission to provide you with exceptional heart care, we have created designated Provider Care Teams.  These Care Teams include your primary Cardiologist (physician) and Advanced Practice Providers (APPs -  Physician Assistants and Nurse Practitioners) who all work together to provide you with the care you need, when you need it.  We recommend signing up for the patient portal called "MyChart".  Sign up information is provided on this After Visit Summary.  MyChart is used to  connect with patients for Virtual Visits (Telemedicine).  Patients are able to view lab/test results, encounter notes, upcoming appointments, etc.  Non-urgent messages can be sent to your provider as well.   To learn more about what you can do with MyChart, go to [email or web address].    Your next appointment:   2 month(s)  The format  for your next appointment:   In Person  Provider:   Gypsy Balsam, MD    Other Instructions None

## 2021-09-16 LAB — LDL CHOLESTEROL, DIRECT: LDL Direct: 104 mg/dL — ABNORMAL HIGH (ref 0–99)

## 2021-09-18 ENCOUNTER — Other Ambulatory Visit: Payer: Self-pay

## 2021-09-18 DIAGNOSIS — R079 Chest pain, unspecified: Secondary | ICD-10-CM

## 2021-09-19 ENCOUNTER — Telehealth (HOSPITAL_COMMUNITY): Payer: Self-pay

## 2021-09-19 NOTE — Progress Notes (Signed)
d 

## 2021-09-19 NOTE — Telephone Encounter (Signed)
Spoke with the patient, detailed instructions given. She stated that she would be here and understood. Asked to call back with any questions. S.Horice Carrero EMTP

## 2021-09-26 ENCOUNTER — Other Ambulatory Visit: Payer: Self-pay

## 2021-09-26 ENCOUNTER — Ambulatory Visit (HOSPITAL_BASED_OUTPATIENT_CLINIC_OR_DEPARTMENT_OTHER): Payer: Medicare Other

## 2021-09-26 ENCOUNTER — Ambulatory Visit (HOSPITAL_COMMUNITY): Payer: Medicare Other | Attending: Cardiovascular Disease

## 2021-09-26 DIAGNOSIS — R079 Chest pain, unspecified: Secondary | ICD-10-CM | POA: Insufficient documentation

## 2021-09-26 DIAGNOSIS — R0609 Other forms of dyspnea: Secondary | ICD-10-CM | POA: Diagnosis not present

## 2021-09-26 DIAGNOSIS — I2510 Atherosclerotic heart disease of native coronary artery without angina pectoris: Secondary | ICD-10-CM

## 2021-09-26 DIAGNOSIS — F17200 Nicotine dependence, unspecified, uncomplicated: Secondary | ICD-10-CM | POA: Diagnosis present

## 2021-09-26 DIAGNOSIS — E7800 Pure hypercholesterolemia, unspecified: Secondary | ICD-10-CM | POA: Diagnosis present

## 2021-09-26 DIAGNOSIS — I700 Atherosclerosis of aorta: Secondary | ICD-10-CM

## 2021-09-26 DIAGNOSIS — I10 Essential (primary) hypertension: Secondary | ICD-10-CM

## 2021-09-26 DIAGNOSIS — I2584 Coronary atherosclerosis due to calcified coronary lesion: Secondary | ICD-10-CM | POA: Insufficient documentation

## 2021-09-26 LAB — MYOCARDIAL PERFUSION IMAGING
LV dias vol: 54 mL (ref 46–106)
LV sys vol: 20 mL
Nuc Stress EF: 62 %
Peak HR: 102 {beats}/min
Rest HR: 63 {beats}/min
Rest Nuclear Isotope Dose: 9.7 mCi
SDS: 2
SRS: 3
SSS: 5
ST Depression (mm): 0 mm
Stress Nuclear Isotope Dose: 32.3 mCi
TID: 1.02

## 2021-09-26 LAB — ECHOCARDIOGRAM COMPLETE
Area-P 1/2: 3.48 cm2
Height: 62 in
P 1/2 time: 397 msec
S' Lateral: 3 cm
Weight: 1728 oz

## 2021-09-26 MED ORDER — TECHNETIUM TC 99M TETROFOSMIN IV KIT
32.3000 | PACK | Freq: Once | INTRAVENOUS | Status: AC | PRN
  Administered 2021-09-26: 32.3 via INTRAVENOUS
  Filled 2021-09-26: qty 33

## 2021-09-26 MED ORDER — REGADENOSON 0.4 MG/5ML IV SOLN
0.4000 mg | Freq: Once | INTRAVENOUS | Status: AC
  Administered 2021-09-26: 0.4 mg via INTRAVENOUS

## 2021-09-26 MED ORDER — TECHNETIUM TC 99M TETROFOSMIN IV KIT
9.7000 | PACK | Freq: Once | INTRAVENOUS | Status: AC | PRN
  Administered 2021-09-26: 9.7 via INTRAVENOUS
  Filled 2021-09-26: qty 10

## 2021-09-27 ENCOUNTER — Other Ambulatory Visit: Payer: Self-pay | Admitting: Family

## 2021-10-28 ENCOUNTER — Other Ambulatory Visit: Payer: Self-pay | Admitting: Family

## 2021-10-31 ENCOUNTER — Other Ambulatory Visit: Payer: Self-pay | Admitting: Cardiology

## 2021-10-31 DIAGNOSIS — F17200 Nicotine dependence, unspecified, uncomplicated: Secondary | ICD-10-CM

## 2021-10-31 DIAGNOSIS — R079 Chest pain, unspecified: Secondary | ICD-10-CM

## 2021-10-31 DIAGNOSIS — I10 Essential (primary) hypertension: Secondary | ICD-10-CM

## 2021-10-31 DIAGNOSIS — I700 Atherosclerosis of aorta: Secondary | ICD-10-CM

## 2021-10-31 DIAGNOSIS — I6529 Occlusion and stenosis of unspecified carotid artery: Secondary | ICD-10-CM

## 2021-10-31 DIAGNOSIS — E7800 Pure hypercholesterolemia, unspecified: Secondary | ICD-10-CM

## 2021-11-06 ENCOUNTER — Ambulatory Visit (HOSPITAL_COMMUNITY)
Admission: RE | Admit: 2021-11-06 | Discharge: 2021-11-06 | Disposition: A | Discharge status: Home / Self Care | Payer: Medicare Other | Source: Ambulatory Visit | Attending: Cardiovascular Disease | Admitting: Cardiovascular Disease

## 2021-11-06 ENCOUNTER — Other Ambulatory Visit: Payer: Self-pay

## 2021-11-06 DIAGNOSIS — E7800 Pure hypercholesterolemia, unspecified: Secondary | ICD-10-CM | POA: Diagnosis present

## 2021-11-06 DIAGNOSIS — R079 Chest pain, unspecified: Secondary | ICD-10-CM | POA: Diagnosis present

## 2021-11-06 DIAGNOSIS — I700 Atherosclerosis of aorta: Secondary | ICD-10-CM | POA: Diagnosis present

## 2021-11-06 DIAGNOSIS — I6529 Occlusion and stenosis of unspecified carotid artery: Secondary | ICD-10-CM | POA: Insufficient documentation

## 2021-11-06 DIAGNOSIS — F17200 Nicotine dependence, unspecified, uncomplicated: Secondary | ICD-10-CM | POA: Diagnosis present

## 2021-11-06 DIAGNOSIS — I6523 Occlusion and stenosis of bilateral carotid arteries: Secondary | ICD-10-CM | POA: Diagnosis not present

## 2021-11-06 DIAGNOSIS — I10 Essential (primary) hypertension: Secondary | ICD-10-CM | POA: Insufficient documentation

## 2021-11-09 ENCOUNTER — Telehealth: Payer: Self-pay

## 2021-11-09 NOTE — Telephone Encounter (Signed)
-----   Message from Georgeanna Lea, MD sent at 11/08/2021 11:17 AM EDT ----- ?No evidence of significant stenosis in carotid circulation ?

## 2021-11-09 NOTE — Telephone Encounter (Signed)
Patient notified of results.

## 2021-11-09 NOTE — Telephone Encounter (Signed)
-----   Message from Robert J Krasowski, MD sent at 11/08/2021 11:17 AM EDT ----- ?No evidence of significant stenosis in carotid circulation ?

## 2021-11-09 NOTE — Telephone Encounter (Signed)
LM to return my call. 

## 2021-11-14 ENCOUNTER — Ambulatory Visit: Payer: Medicare Other | Admitting: Cardiology

## 2021-11-23 ENCOUNTER — Ambulatory Visit (INDEPENDENT_AMBULATORY_CARE_PROVIDER_SITE_OTHER): Payer: Medicare Other

## 2021-11-23 VITALS — Ht 62.0 in | Wt 109.0 lb

## 2021-11-23 DIAGNOSIS — Z0000 Encounter for general adult medical examination without abnormal findings: Secondary | ICD-10-CM

## 2021-11-23 NOTE — Progress Notes (Signed)
? ?Subjective:  ? [name] is a 76 y.o. female who presents for an Initial Medicare Annual Wellness Visit. ?Virtual Visit via Telephone Note ? ?I connected with  Infant H Gloster on 11/23/21 at 10:45 AM EDT by telephone and verified that I am speaking with the correct person using two identifiers. ? ?Location: ?Patient: HOME ?Provider: [facility] ?Persons participating in the virtual visit: patient/Nurse Health Advisor ?  ?I discussed the limitations, risks, security and privacy concerns of performing an evaluation and management service by telephone and the availability of in person appointments. The patient expressed understanding and agreed to proceed. ? ?Interactive audio and video telecommunications were attempted between this nurse and patient, however failed, due to patient having technical difficulties OR patient did not have access to video capability.  We continued and completed visit with audio only. ? ?Some vital signs may be absent or patient reported.  ? ?Chriss Driver, LPN ? ?Review of Systems    ? ?Cardiac Risk Factors include: advanced age (>59men, >58 women);hypertension;dyslipidemia;sedentary lifestyle ? ?   ?Objective:  ?  ?Today's Vitals  ? 11/23/21 1047  ?Weight: 109 lb (49.4 kg)  ?Height: 5\' 2"  (1.575 m)  ? ?Body mass index is 19.94 kg/m?. ? ? ?  11/23/2021  ? 11:00 AM 08/17/2021  ?  4:08 PM 05/06/2020  ?  5:42 PM 11/27/2015  ?  9:40 AM 11/26/2015  ?  5:00 PM 04/14/2015  ?  1:17 PM 01/19/2015  ?  8:30 AM  ?Advanced Directives  ?Does Patient Have a Medical Advance Directive? Yes No No No No No No  ?Type of Paramedic of [place];Living will        ?Copy of [place] in Chart? No - copy requested        ?Would patient like information on creating a medical advance directive?  No - Patient declined  No - patient declined information No - patient declined information  No - patient declined information  ? ? ?Current Medications (verified) ?Outpatient  Encounter Medications as of 11/23/2021  ?Medication Sig  ? amLODipine (NORVASC) 2.5 MG tablet TAKE 1 TABLET BY MOUTH EVERY DAY  ? aspirin EC 81 MG tablet Take 81 mg by mouth daily. Swallow whole.  ? ergocalciferol (VITAMIN D2) 1.25 MG (50000 UT) capsule Take 50,000 Units by mouth once a week.  ? pravastatin (PRAVACHOL) 80 MG tablet Take 80 mg by mouth daily.   ? ?No facility-administered encounter medications on file as of 11/23/2021.  ? ? ?Allergies (verified) ?Avelox [moxifloxacin], Penicillins, Buprenorphine hcl, Morphine and related, Nitrofuran derivatives, Other, Procaine, Sulfa antibiotics, Sulfacetamide sodium, and Aspirin  ? ?History: ?Past Medical History:  ?Diagnosis Date  ? Arthritis   ? High cholesterol   ? Hypertension   ? ?Past Surgical History:  ?Procedure Laterality Date  ? ABDOMINAL HYSTERECTOMY    ? CHOLECYSTECTOMY N/A 01/21/2015  ? Procedure: LAPAROSCOPIC CHOLECYSTECTOMY;  Surgeon: Judeth Horn, MD;  Location: [facility];  Service: General;  Laterality: N/A;  ? FRACTURE SURGERY    ? INTRAMEDULLARY (IM) NAIL INTERTROCHANTERIC Left 11/27/2015  ? Procedure: INTRAMEDULLARY (IM) NAIL INTERTROCHANTRIC;  Surgeon: Marybelle Killings, MD;  Location[facility];  Service: Orthopedics;  Laterality: Left;  ? LAPAROSCOPY N/A 04/15/2015  ? Procedure: LAPAROSCOPY DIAGNOSTIC ileocalpexy ;  Surgeon: Michael Boston, MD;  Location: [facility];  Service: General;  Laterality: N/A;  ? SMALL INTESTINE SURGERY Right   ? TONSILLECTOMY    ? TUBAL LIGATION  08/13/1978  ? ?  Family History  ?Problem Relation Age of Onset  ? Stroke Mother   ? ?Social History  ? ?Socioeconomic History  ? Marital status: Married  ?  Spouse name: Nicole Kindred  ? Number of children: 3  ? Years of education: Not on file  ? Highest education level: Not on file  ?Occupational History  ? Not on file  ?Tobacco Use  ? Smoking status: Former  ?  Packs/day: 1.00  ?  Years: 40.00  ?  Pack years: 40.00  ?  Types: Cigarettes, E-cigarettes  ?  Quit date: 01/11/2013  ?  Years since quitting:  8.8  ? Smokeless tobacco: Never  ?Substance and Sexual Activity  ? Alcohol use: Not Currently  ?  Comment: 1 beer/day  ? Drug use: Never  ? Sexual activity: Not Currently  ?  Birth control/protection: Surgical  ?Other Topics Concern  ? Not on file  ?Social History Narrative  ? 3 sons  ? 10 grandchildren  ? 3 great grandchildren  ? Recently got back from [place] seeing granddaughter.  ? Married x 30 years.  ? ?Social Determinants of Health  ? ?Financial Resource Strain: Low Risk   ? Difficulty of Paying Living Expenses: Not hard at all  ?Food Insecurity: No Food Insecurity  ? Worried About Charity fundraiser in the Last Year: Never true  ? Ran Out of Food in the Last Year: Never true  ?Transportation Needs: No Transportation Needs  ? Lack of Transportation (Medical): No  ? Lack of Transportation (Non-Medical): No  ?Physical Activity: Sufficiently Active  ? Days of Exercise per Week: 5 days  ? Minutes of Exercise per Session: 30 min  ?Stress: No Stress Concern Present  ? Feeling of Stress : Not at all  ?Social Connections: Socially Integrated  ? Frequency of Communication with Friends and Family: More than three times a week  ? Frequency of Social Gatherings with Friends and Family: More than three times a week  ? Attends Religious Services: More than 4 times per year  ? Active Member of Clubs or Organizations: Yes  ? Attends Archivist Meetings: More than 4 times per year  ? Marital Status: Married  ? ? ?Tobacco Counseling ?Counseling given: Not Answered ? ? ?Clinical Intake: ? ?Pre-visit preparation completed: Yes ? ?Pain : No/denies pain ? ?  ? ?BMI - recorded: 19.94 ?Nutritional Status: BMI of 19-24  Normal ?Nutritional Risks: None ?Diabetes: No ? ?How often do you need to have someone help you when you read instructions, pamphlets, or other written materials from your doctor or pharmacy?: 1 - Never ? ?Diabetic?no ? ?Interpreter Needed?: No ? ?Information entered by :: mj Jia Mohamed, lpn ? ? ?Activities of Daily  Living ? ?  11/23/2021  ? 11:02 AM  ?In your present state of health, do you have any difficulty performing the following activities:  ?Hearing? 0  ?Vision? 0  ?Difficulty concentrating or making decisions? 0  ?Walking or climbing stairs? 0  ?Dressing or bathing? 0  ?Doing errands, shopping? 0  ?Preparing Food and eating ? N  ?Using the Toilet? N  ?In the past six months, have you accidently leaked urine? N  ?Comment Urgency  ?Do you have problems with loss of bowel control? N  ?Managing your Medications? N  ?Managing your Finances? N  ?Housekeeping or managing your Housekeeping? N  ? ? ?Patient Care Team: ?Marrian Salvage, FNP as PCP - General (Internal Medicine) ? ?Indicate any recent Medical Services you may have  received from other than [facility] providers in the past year (date may be approximate). ? ?   ?Assessment:  ? This is a routine wellness examination for [name]. ? ?Hearing/Vision screen ?Hearing Screening - Comments:: No hearing issues.  ?Vision Screening - Comments:: Glasses to drive and read. Dr. Jacqualin Combes and Dr. Kristian Covey. ? ?Dietary issues and exercise activities discussed: ?Current Exercise Habits: Home exercise routine, Type of exercise: walking, Time (Minutes): 30, Frequency (Times/Week): 5, Weekly Exercise (Minutes/Week): 150, Intensity: Mild, Exercise limited by: cardiac condition(s);orthopedic condition(s) ? ? Goals Addressed   ? ?  ?  ?  ?  ? This Visit's Progress  ?  Exercise 150 min/wk Moderate Activity     ?  Continue to exercise and stay healthy. Continue to travel and see family. ?  ? ?  ? ?Depression Screen ? ?  11/23/2021  ? 10:57 AM 08/31/2021  ?  9:24 AM 08/03/2021  ?  8:50 AM  ?PHQ 2/9 Scores  ?PHQ - 2 Score 0 0 0  ?  ?Fall Risk ? ?  11/23/2021  ? 11:00 AM 08/31/2021  ?  9:24 AM 08/03/2021  ?  8:50 AM  ?Fall Risk   ?Falls in the past year? 0 0 0  ?Number falls in past yr: 0 0 0  ?Injury with Fall? 0 0 0  ?Risk for fall due to : No Fall Risks No Fall Risks No Fall Risks  ?Follow up Falls  prevention discussed Falls evaluation completed Falls evaluation completed  ? ? ?FALL RISK PREVENTION PERTAINING TO THE HOME: ? ?Any stairs in or around the home? Yes  ?If so, are there any without han

## 2021-11-23 NOTE — Patient Instructions (Signed)
Ms. Gudger , ?Thank you for taking time to come for your Medicare Wellness Visit. I appreciate your ongoing commitment to your health goals. Please review the following plan we discussed and let me know if I can assist you in the future.  ? ?Screening recommendations/referrals: ?Colonoscopy: Cologuard 08/16/2021 repeat every 3 years. ?Mammogram: Discussed. Repeat annually ? ?Bone Density: Discussed. Repeat every 2 years ? ?Recommended yearly ophthalmology/optometry visit for glaucoma screening and checkup ?Recommended yearly dental visit for hygiene and checkup ? ?Vaccinations: ?Influenza vaccine: Declined. ?Pneumococcal vaccine: Done 10/03/2015 and 01/15/2017. ?Tdap vaccine: Due Repeat in 10 years ? ?Shingles vaccine: Discussed.   ?Covid-19:Declined. ? ?Advanced directives: Please bring a copy of your health care power of attorney and living will to the office to be added to your chart at your convenience. ? ? ?Conditions/risks identified: Aim for 30 minutes of exercise or brisk walking, 6-8 glasses of water, and 5 servings of fruits and vegetables each day. ?KEEP UP THE GOOD WORK!! ? ?Next appointment: Follow up in one year for your annual wellness visit 2024. ? ? ?Preventive Care 59 Years and Older, Female ?Preventive care refers to lifestyle choices and visits with your health care provider that can promote health and wellness. ?What does preventive care include? ?A yearly physical exam. This is also called an annual well check. ?Dental exams once or twice a year. ?Routine eye exams. Ask your health care provider how often you should have your eyes checked. ?Personal lifestyle choices, including: ?Daily care of your teeth and gums. ?Regular physical activity. ?Eating a healthy diet. ?Avoiding tobacco and drug use. ?Limiting alcohol use. ?Practicing safe sex. ?Taking low-dose aspirin every day. ?Taking vitamin and mineral supplements as recommended by your health care provider. ?What happens during an annual well  check? ?The services and screenings done by your health care provider during your annual well check will depend on your age, overall health, lifestyle risk factors, and family history of disease. ?Counseling  ?Your health care provider may ask you questions about your: ?Alcohol use. ?Tobacco use. ?Drug use. ?Emotional well-being. ?Home and relationship well-being. ?Sexual activity. ?Eating habits. ?History of falls. ?Memory and ability to understand (cognition). ?Work and work Astronomer. ?Reproductive health. ?Screening  ?You may have the following tests or measurements: ?Height, weight, and BMI. ?Blood pressure. ?Lipid and cholesterol levels. These may be checked every 5 years, or more frequently if you are over 88 years old. ?Skin check. ?Lung cancer screening. You may have this screening every year starting at age 80 if you have a 30-pack-year history of smoking and currently smoke or have quit within the past 15 years. ?Fecal occult blood test (FOBT) of the stool. You may have this test every year starting at age 27. ?Flexible sigmoidoscopy or colonoscopy. You may have a sigmoidoscopy every 5 years or a colonoscopy every 10 years starting at age 41. ?Hepatitis C blood test. ?Hepatitis B blood test. ?Sexually transmitted disease (STD) testing. ?Diabetes screening. This is done by checking your blood sugar (glucose) after you have not eaten for a while (fasting). You may have this done every 1-3 years. ?Bone density scan. This is done to screen for osteoporosis. You may have this done starting at age 79. ?Mammogram. This may be done every 1-2 years. Talk to your health care provider about how often you should have regular mammograms. ?Talk with your health care provider about your test results, treatment options, and if necessary, the need for more tests. ?Vaccines  ?Your health care provider may  recommend certain vaccines, such as: ?Influenza vaccine. This is recommended every year. ?Tetanus, diphtheria, and  acellular pertussis (Tdap, Td) vaccine. You may need a Td booster every 10 years. ?Zoster vaccine. You may need this after age 77. ?Pneumococcal 13-valent conjugate (PCV13) vaccine. One dose is recommended after age 80. ?Pneumococcal polysaccharide (PPSV23) vaccine. One dose is recommended after age 4. ?Talk to your health care provider about which screenings and vaccines you need and how often you need them. ?This information is not intended to replace advice given to you by your health care provider. Make sure you discuss any questions you have with your health care provider. ?Document Released: 08/26/2015 Document Revised: 04/18/2016 Document Reviewed: 05/31/2015 ?Elsevier Interactive Patient Education ? 2017 [place]. ? ?Fall Prevention in the Home ?Falls can cause injuries. They can happen to people of all ages. There are many things you can do to make your home safe and to help prevent falls. ?What can I do on the outside of my home? ?Regularly fix the edges of walkways and driveways and fix any cracks. ?Remove anything that might make you trip as you walk through a door, such as a raised step or threshold. ?Trim any bushes or trees on the path to your home. ?Use bright outdoor lighting. ?Clear any walking paths of anything that might make someone trip, such as rocks or tools. ?Regularly check to see if handrails are loose or broken. Make sure that both sides of any steps have handrails. ?Any raised decks and porches should have guardrails on the edges. ?Have any leaves, snow, or ice cleared regularly. ?Use sand or salt on walking paths during winter. ?Clean up any spills in your garage right away. This includes oil or grease spills. ?What can I do in the bathroom? ?Use night lights. ?Install grab bars by the toilet and in the tub and shower. Do not use towel bars as grab bars. ?Use non-skid mats or decals in the tub or shower. ?If you need to sit down in the shower, use a plastic, non-slip stool. ?Keep  the floor dry. Clean up any water that spills on the floor as soon as it happens. ?Remove soap buildup in the tub or shower regularly. ?Attach bath mats securely with double-sided non-slip rug tape. ?Do not have throw rugs and other things on the floor that can make you trip. ?What can I do in the bedroom? ?Use night lights. ?Make sure that you have a light by your bed that is easy to reach. ?Do not use any sheets or blankets that are too big for your bed. They should not hang down onto the floor. ?Have a firm chair that has side arms. You can use this for support while you get dressed. ?Do not have throw rugs and other things on the floor that can make you trip. ?What can I do in the kitchen? ?Clean up any spills right away. ?Avoid walking on wet floors. ?Keep items that you use a lot in easy-to-reach places. ?If you need to reach something above you, use a strong step stool that has a grab bar. ?Keep electrical cords out of the way. ?Do not use floor polish or wax that makes floors slippery. If you must use wax, use non-skid floor wax. ?Do not have throw rugs and other things on the floor that can make you trip. ?What can I do with my stairs? ?Do not leave any items on the stairs. ?Make sure that there are handrails on both sides of  the stairs and use them. Fix handrails that are broken or loose. Make sure that handrails are as long as the stairways. ?Check any carpeting to make sure that it is firmly attached to the stairs. Fix any carpet that is loose or worn. ?Avoid having throw rugs at the top or bottom of the stairs. If you do have throw rugs, attach them to the floor with carpet tape. ?Make sure that you have a light switch at the top of the stairs and the bottom of the stairs. If you do not have them, ask someone to add them for you. ?What else can I do to help prevent falls? ?Wear shoes that: ?Do not have high heels. ?Have rubber bottoms. ?Are comfortable and fit you well. ?Are closed at the toe. Do not  wear sandals. ?If you use a stepladder: ?Make sure that it is fully opened. Do not climb a closed stepladder. ?Make sure that both sides of the stepladder are locked into place. ?Ask someone to hold it for you

## 2021-12-07 ENCOUNTER — Telehealth: Payer: Self-pay | Admitting: Family

## 2021-12-07 ENCOUNTER — Other Ambulatory Visit: Payer: Self-pay | Admitting: Family

## 2021-12-07 DIAGNOSIS — R9389 Abnormal findings on diagnostic imaging of other specified body structures: Secondary | ICD-10-CM

## 2021-12-07 NOTE — Telephone Encounter (Signed)
-----   Message from Olive Bass, FNP sent at 09/06/2021 10:36 AM EST ----- ?Needs repeat CT ? ?

## 2021-12-07 NOTE — Telephone Encounter (Signed)
Due for repeat chest CT; is she okay with me getting this scheduled for her? We had discussed doing a 3 month follow up from the CT in January 2023.  ?

## 2021-12-07 NOTE — Telephone Encounter (Signed)
Pt reports that she is okay with getting the repeat Chest CT however she would like it scheduled for around the mid to end of June since she has a few trips coming up in may and early June.  ?

## 2022-01-01 ENCOUNTER — Other Ambulatory Visit (HOSPITAL_BASED_OUTPATIENT_CLINIC_OR_DEPARTMENT_OTHER): Payer: Medicare Other

## 2022-01-02 ENCOUNTER — Ambulatory Visit (HOSPITAL_BASED_OUTPATIENT_CLINIC_OR_DEPARTMENT_OTHER)
Admission: RE | Admit: 2022-01-02 | Discharge: 2022-01-02 | Disposition: A | Discharge status: Home / Self Care | Payer: Medicare Other | Source: Ambulatory Visit | Attending: Family | Admitting: Family

## 2022-01-02 ENCOUNTER — Ambulatory Visit (INDEPENDENT_AMBULATORY_CARE_PROVIDER_SITE_OTHER): Payer: Medicare Other | Admitting: Family

## 2022-01-02 VITALS — BP 168/80 | HR 67 | Temp 98.0°F | Resp 18 | Ht 62.0 in | Wt 109.8 lb

## 2022-01-02 DIAGNOSIS — E7800 Pure hypercholesterolemia, unspecified: Secondary | ICD-10-CM | POA: Diagnosis not present

## 2022-01-02 DIAGNOSIS — R739 Hyperglycemia, unspecified: Secondary | ICD-10-CM

## 2022-01-02 DIAGNOSIS — I10 Essential (primary) hypertension: Secondary | ICD-10-CM | POA: Diagnosis not present

## 2022-01-02 DIAGNOSIS — E559 Vitamin D deficiency, unspecified: Secondary | ICD-10-CM

## 2022-01-02 DIAGNOSIS — R9389 Abnormal findings on diagnostic imaging of other specified body structures: Secondary | ICD-10-CM | POA: Diagnosis present

## 2022-01-02 LAB — COMPREHENSIVE METABOLIC PANEL
ALT: 12 U/L (ref 0–35)
AST: 17 U/L (ref 0–37)
Albumin: 4.7 g/dL (ref 3.5–5.2)
Alkaline Phosphatase: 67 U/L (ref 39–117)
BUN: 13 mg/dL (ref 6–23)
CO2: 31 mEq/L (ref 19–32)
Calcium: 9.5 mg/dL (ref 8.4–10.5)
Chloride: 102 mEq/L (ref 96–112)
Creatinine, Ser: 0.73 mg/dL (ref 0.40–1.20)
GFR: 81.38 mL/min (ref >60.00)
Glucose, Bld: 102 mg/dL — ABNORMAL HIGH (ref 70–99)
Potassium: 4.7 mEq/L (ref 3.5–5.1)
Sodium: 139 mEq/L (ref 135–145)
Total Bilirubin: 0.4 mg/dL (ref 0.2–1.2)
Total Protein: 6.7 g/dL (ref 6.0–8.3)

## 2022-01-02 LAB — CBC WITH DIFFERENTIAL/PLATELET
Basophils Absolute: 0 10*3/uL (ref 0.0–0.1)
Basophils Relative: 0.4 % (ref 0.0–3.0)
Eosinophils Absolute: 0.1 10*3/uL (ref 0.0–0.7)
Eosinophils Relative: 2.4 % (ref 0.0–5.0)
HCT: 40.8 % (ref 36.0–46.0)
Hemoglobin: 13.8 g/dL (ref 12.0–15.0)
Lymphocytes Relative: 25.5 % (ref 12.0–46.0)
Lymphs Abs: 1.4 10*3/uL (ref 0.7–4.0)
MCHC: 33.8 g/dL (ref 30.0–36.0)
MCV: 92.4 fl (ref 78.0–100.0)
Monocytes Absolute: 0.4 10*3/uL (ref 0.1–1.0)
Monocytes Relative: 7.6 % (ref 3.0–12.0)
Neutro Abs: 3.5 10*3/uL (ref 1.4–7.7)
Neutrophils Relative %: 64.1 % (ref 43.0–77.0)
Platelets: 232 10*3/uL (ref 150.0–400.0)
RBC: 4.42 Mil/uL (ref 3.87–5.11)
RDW: 13.4 % (ref 11.5–15.5)
WBC: 5.5 10*3/uL (ref 4.0–10.5)

## 2022-01-02 LAB — LIPID PANEL
Cholesterol: 202 mg/dL — ABNORMAL HIGH (ref 0–200)
HDL: 72.6 mg/dL (ref >39.00)
LDL Cholesterol: 115 mg/dL — ABNORMAL HIGH (ref 0–99)
NonHDL: 129.1
Total CHOL/HDL Ratio: 3
Triglycerides: 71 mg/dL (ref 0.0–149.0)
VLDL: 14.2 mg/dL (ref 0.0–40.0)

## 2022-01-02 LAB — VITAMIN D 25 HYDROXY (VIT D DEFICIENCY, FRACTURES): VITD: 88.82 ng/mL (ref 30.00–100.00)

## 2022-01-02 LAB — HEMOGLOBIN A1C: Hgb A1c MFr Bld: 5.8 % (ref 4.6–6.5)

## 2022-01-02 MED ORDER — PRAVASTATIN SODIUM 80 MG PO TABS: 80.0000 mg | ORAL_TABLET | Freq: Every day | ORAL | 3 refills | Status: DC

## 2022-01-02 MED ORDER — ERGOCALCIFEROL 1.25 MG (50000 UT) PO CAPS: 50000.0000 [IU] | ORAL_CAPSULE | ORAL | 3 refills | Status: DC

## 2022-01-02 MED ORDER — AMLODIPINE BESYLATE 2.5 MG PO TABS: 2.5000 mg | ORAL_TABLET | Freq: Every day | ORAL | 3 refills | Status: DC

## 2022-01-02 NOTE — Progress Notes (Signed)
[name] is a 76 y.o. 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 Swallow whole.     amLODipine (NORVASC) 2.5 MG tablet Take 1 tablet (2.5 mg total) by mouth daily. 90 tablet 3   ergocalciferol (VITAMIN D2) 1.25 MG (50000 UT) capsule Take 1 capsule (50,000 Units total) by mouth once a week. 12 capsule 3   pravastatin (PRAVACHOL) 80 MG tablet Take 1 tablet (80 mg total) by mouth daily. 90 tablet 3   No current facility-administered medications for this visit.    Allergies: Avelox [moxifloxacin], Penicillins, Buprenorphine hcl, Morphine and related, Nitrofuran derivatives, Other, Procaine, Sulfa antibiotics, Sulfacetamide sodium, and Aspirin  Past Medical History:  Diagnosis Date   Arthritis    High cholesterol    Hypertension     Past Surgical History:  Procedure Laterality Date   ABDOMINAL HYSTERECTOMY     CHOLECYSTECTOMY N/A 01/21/2015   Procedure: LAPAROSCOPIC CHOLECYSTECTOMY;  Surgeon: Judeth Horn, MD;  Location: [facility];  Service: General;  Laterality: N/A;   FRACTURE SURGERY     INTRAMEDULLARY (IM) NAIL INTERTROCHANTERIC Left 11/27/2015   Procedure: INTRAMEDULLARY (IM) NAIL INTERTROCHANTRIC;  Surgeon: Marybelle Killings, MD;  [facility];  Service: Orthopedics;  Laterality:  Left;   LAPAROSCOPY N/A 04/15/2015   Procedure: LAPAROSCOPY DIAGNOSTIC ileocalpexy ;  Surgeon: Michael Boston, MD;  Location[facility];  Service: General;  Laterality: N/A;   SMALL INTESTINE SURGERY Right    TONSILLECTOMY     TUBAL LIGATION  08/13/1978    Family History  Problem Relation Age of Onset   Stroke Mother     Social History   Tobacco Use   Smoking status: Former    Packs/day: 1.00    Years: 40.00    Pack years: 40.00    Types: Cigarettes, E-cigarettes    Quit date: 01/11/2013    Years since quitting: 8.9   Smokeless tobacco: Never  Substance Use Topics   Alcohol use: Not Currently    Comment: 1 beer/day    Subjective:  Follow up on hypertension/ hyperlipidemia; notes that she forgot to take her medication this morning; has been feeling very good in general recently; occasional headache but denies this as chronic concern; no vision changes; Needs yearly labs updated; will be having repeat chest CT later this morning;     Objective:  Vitals:   01/02/22 0909  BP: (!) 168/80  Pulse: 67  Resp: 18  Temp: 98 F (36.7 C)  TempSrc: Oral  SpO2: 97%  Weight: 109 lb 12.8 oz (49.8 kg)  Height: _0  (1.575 m)    General: Well developed, well nourished, in no acute distress  Skin : Warm and dry.  Head: Normocephalic and atraumatic  Eyes: Sclera and conjunctiva clear; pupils round and reactive to light; extraocular movements intact  Ears: External  normal; canals clear; tympanic membranes normal  Oropharynx: Pink, supple. No suspicious lesions  Neck: Supple without thyromegaly, adenopathy  Lungs: Respirations unlabored; clear to auscultation bilaterally without wheeze, rales, rhonchi  CVS exam: normal rate and regular rhythm.  Neurologic: Alert and oriented; speech intact; face symmetrical; moves all extremities well; CNII-XII intact without focal deficit   Assessment:  1. Essential (primary) hypertension   2. Hypercholesteremia   3. Hyperglycemia   4. Vitamin D  deficiency     Plan:  Stressed need to take blood pressure mediation daily as prescribed; check CBC, CMP today; refill updated; Stable; refill updated; Check Hgba1c; Check Vitamin D level today;  Patient again defers mammogram/ vaccines today;   Return in about 6 months (around 07/05/2022).  Orders Placed This Encounter  Procedures   CBC with Differential/Platelet   Comp Met (CMET)   Lipid panel   Hemoglobin A1c   Vitamin D (25 hydroxy)    Requested Prescriptions   Signed Prescriptions Disp Refills   amLODipine (NORVASC) 2.5 MG tablet 90 tablet 3    Sig: Take 1 tablet (2.5 mg total) by mouth daily.   ergocalciferol (VITAMIN D2) 1.25 MG (50000 UT) capsule 12 capsule 3    Sig: Take 1 capsule (50,000 Units total) by mouth once a week.   pravastatin (PRAVACHOL) 80 MG tablet 90 tablet 3    Sig: Take 1 tablet (80 mg total) by mouth daily.

## 2022-02-20 ENCOUNTER — Ambulatory Visit (INDEPENDENT_AMBULATORY_CARE_PROVIDER_SITE_OTHER): Payer: Medicare Other | Admitting: Cardiology

## 2022-02-20 ENCOUNTER — Encounter: Payer: Self-pay | Admitting: Cardiology

## 2022-02-20 VITALS — BP 142/82 | HR 54 | Ht 63.0 in | Wt 111.1 lb

## 2022-02-20 DIAGNOSIS — E7800 Pure hypercholesterolemia, unspecified: Secondary | ICD-10-CM | POA: Diagnosis not present

## 2022-02-20 DIAGNOSIS — I2584 Coronary atherosclerosis due to calcified coronary lesion: Secondary | ICD-10-CM

## 2022-02-20 DIAGNOSIS — I700 Atherosclerosis of aorta: Secondary | ICD-10-CM

## 2022-02-20 DIAGNOSIS — I10 Essential (primary) hypertension: Secondary | ICD-10-CM | POA: Diagnosis not present

## 2022-02-20 DIAGNOSIS — I2510 Atherosclerotic heart disease of native coronary artery without angina pectoris: Secondary | ICD-10-CM

## 2022-02-20 MED ORDER — ROSUVASTATIN CALCIUM 20 MG PO TABS: 20.0000 mg | ORAL_TABLET | Freq: Every day | ORAL | 3 refills | Status: DC

## 2022-02-20 NOTE — Patient Instructions (Signed)
Medication Instructions:  Your physician has recommended you make the following change in your medication: STOP: Pravastatin START: Crestor 20mg  daily    Lab Work: Your physician recommends that you return for lab work in: 6 weeks You need to have labs done when you are fasting.  You can come Monday through Friday 8:30 am to 11:30 am and 1:15 to 4:30. Thursday and Friday - Morning hours- ONLY - You do not need to make an appointment as the order has already been placed. The labs you are going to have done are ALT, AST Lipids.    Testing/Procedures: None Ordered   Follow-Up: At [facility], you and your health needs are our priority.  As part of our continuing mission to provide you with exceptional heart care, we have created designated Provider Care Teams.  These Care Teams include your primary Cardiologist (physician) and Advanced Practice Providers (APPs -  Physician Assistants and Nurse Practitioners) who all work together to provide you with the care you need, when you need it.  We recommend signing up for the patient portal called "MyChart".  Sign up information is provided on this After Visit Summary.  MyChart is used to connect with patients for Virtual Visits (Telemedicine).  Patients are able to view lab/test results, encounter notes, upcoming appointments, etc.  Non-urgent messages can be sent to your provider as well.   To learn more about what you can do with MyChart, go to [facility].    Your next appointment:   12 month(s)  The format for your next appointment:   In Person  Provider:   [email or web address], MD    Other Instructions NA

## 2022-02-20 NOTE — Progress Notes (Signed)
Cardiology Office Note:    Date:  02/20/2022   ID:  [name], DOB 16-Oct-1945, MRN [id number]  PCP:  Lynn Bass, FNP  Cardiologist:  Lynn Balsam, MD    Referring MD: [name],*   Chief Complaint  Patient presents with   Follow-up  Doing fine  History of Present Illness:    [name] is a 76 y.o. female with past medical history significant for ex smoking, essential hypertension, dyslipidemia, she was referred to [place] because CT of her chest performed showed some calcification of the coronary arteries.  As a part of evaluation stress test has been performed which showed no evidence of ischemia she also got dyspnea on exertion which probably was related to COPD and related to long-term smoking and likely she quit echocardiograms been performed which showed preserved ejection fraction but moderate regurgitation of the aortic valve.  She is in my office today, doing well.  Denies have any chest pain tightness squeezing pressure burning chest no palpitations dizziness she is very happy when she is  Past Medical History:  Diagnosis Date   Arthritis    High cholesterol    Hypertension     Past Surgical History:  Procedure Laterality Date   ABDOMINAL HYSTERECTOMY     CHOLECYSTECTOMY N/A 01/21/2015   Procedure: LAPAROSCOPIC CHOLECYSTECTOMY;  Surgeon: Lynn Norman, MD;  Location: [facility];  Service: General;  Laterality: N/A;   FRACTURE SURGERY     INTRAMEDULLARY (IM) NAIL INTERTROCHANTERIC Left 11/27/2015   Procedure: INTRAMEDULLARY (IM) NAIL INTERTROCHANTRIC;  Surgeon: Lynn Manges, MD;  Location: [facility];  Service: Orthopedics;  Laterality: Left;   LAPAROSCOPY N/A 04/15/2015   Procedure: LAPAROSCOPY DIAGNOSTIC ileocalpexy ;  Surgeon: Lynn Soda, MD;  Location: [facility];  Service: General;  Laterality: N/A;   SMALL INTESTINE SURGERY Right    TONSILLECTOMY     TUBAL LIGATION  08/13/1978    Current Medications: Current Meds  Medication Sig    amLODipine (NORVASC) 2.5 MG tablet Take 1 tablet (2.5 mg total) by mouth daily.   aspirin EC 81 MG tablet Take 81 mg by mouth daily. Swallow whole.   ergocalciferol (VITAMIN D2) 1.25 MG (50000 UT) capsule Take 1 capsule (50,000 Units total) by mouth once a week.   pravastatin (PRAVACHOL) 80 MG tablet Take 1 tablet (80 mg total) by mouth daily.     Allergies:   Avelox [moxifloxacin], Penicillins, Buprenorphine hcl, Morphine and related, Nitrofuran derivatives, Other, Procaine, Sulfa antibiotics, Sulfacetamide sodium, and Aspirin   Social History   Socioeconomic History   Marital status: Married    Spouse name: [name]   Number of children: 3   Years of education: Not on file   Highest education level: Not on file  Occupational History   Not on file  Tobacco Use   Smoking status: Former    Packs/day: 1.00    Years: 40.00    Total pack years: 40.00    Types: Cigarettes, E-cigarettes    Quit date: 01/11/2013    Years since quitting: 9.1   Smokeless tobacco: Never  Substance and Sexual Activity   Alcohol use: Not Currently    Comment: 1 beer/day   Drug use: Never   Sexual activity: Not Currently    Birth control/protection: Surgical  Other Topics Concern   Not on file  Social History Narrative   3 sons   10 grandchildren   3 great grandchildren   Recently got back from [place] seeing granddaughter.   Married  x 30 years.   Social Determinants of Health   Financial Resource Strain: Low Risk  (11/23/2021)   Overall Financial Resource Strain (CARDIA)    Difficulty of Paying Living Expenses: Not hard at all  Food Insecurity: No Food Insecurity (11/23/2021)   Hunger Vital Sign    Worried About Running Out of Food in the Last Year: Never true    Ran Out of Food in the Last Year: Never true  Transportation Needs: No Transportation Needs (11/23/2021)   PRAPARE - Administrator, Civil Service (Medical): No    Lack of Transportation (Non-Medical): No  Physical Activity:  Sufficiently Active (11/23/2021)   Exercise Vital Sign    Days of Exercise per Week: 5 days    Minutes of Exercise per Session: 30 min  Stress: No Stress Concern Present (11/23/2021)   [employer] of Occupational Health - Occupational Stress Questionnaire    Feeling of Stress : Not at all  Social Connections: Socially Integrated (11/23/2021)   Social Connection and Isolation Panel [NHANES]    Frequency of Communication with Friends and Family: More than three times a week    Frequency of Social Gatherings with Friends and Family: More than three times a week    Attends Religious Services: More than 4 times per year    Active Member of [employer] or Organizations: Yes    Attends Engineer, structural: More than 4 times per year    Marital Status: Married     Family History: The patient's family history includes Stroke in her mother. ROS:   Please see the history of present illness.    All 14 point review of systems negative except as described per history of present illness  EKGs/Labs/Other Studies Reviewed:      Recent Labs: 01/02/2022: ALT 12; BUN 13; Creatinine, Ser 0.73; Hemoglobin 13.8; Platelets 232.0; Potassium 4.7; Sodium 139  Recent Lipid Panel    Component Value Date/Time   CHOL 202 (H) 01/02/2022 0930   TRIG 71.0 01/02/2022 0930   HDL 72.60 01/02/2022 0930   CHOLHDL 3 01/02/2022 0930   VLDL 14.2 01/02/2022 0930   LDLCALC 115 (H) 01/02/2022 0930   LDLDIRECT 104 (H) 09/15/2021 1124    Physical Exam:    VS:  BP (!) 142/82 (BP Location: Left Arm, Patient Position: Sitting)   Pulse (!) 54   Ht 5\' 3"  (1.6 m)   Wt 111 lb 1.9 oz (50.4 kg)   SpO2 97%   BMI 19.68 kg/m     Wt Readings from Last 3 Encounters:  02/20/22 111 lb 1.9 oz (50.4 kg)  01/02/22 109 lb 12.8 oz (49.8 kg)  11/23/21 109 lb (49.4 kg)     GEN:  Well nourished, well developed in no acute distress HEENT: Normal NECK: No JVD; No carotid bruits LYMPHATICS: No lymphadenopathy CARDIAC: RRR,  soft blowing diastolic murmur grade 1/6 best heard left border of sternum, no rubs, no gallops RESPIRATORY:  Clear to auscultation without rales, wheezing or rhonchi  ABDOMEN: Soft, non-tender, non-distended MUSCULOSKELETAL:  No edema; No deformity  SKIN: Warm and dry LOWER EXTREMITIES: no swelling NEUROLOGIC:  Alert and oriented x 3 PSYCHIATRIC:  Normal affect   ASSESSMENT:    1. Calcification of coronary artery   2. Aortic atherosclerosis (HCC)   3. Essential (primary) hypertension   4. Hypercholesteremia    PLAN:    In order of problems listed above:  Calcification of coronary artery however nonobstructive disease based on stress test which is  negative.  The key is risk factors modifications.  She is already on aspirin, she stay away from smoking.  I am unhappy about her cholesterol her K PN show me LDL of 115 HDL 72.  We talked in length about what can be done in this situation that 2 options here addition of Zetia or switching her to a more powerful cholesterol-lowering medication she elected to switch to rosuvastatin 20 mg daily which I will do fasting lipid profile be done in 6 weeks. Aortic atherosclerosis: Management as above. Essential hypertension blood pressure elevated but she said she always have high blood pressure at doctor's office she does not like driving especially to the area where our offices.  She promised me to check blood pressure on the regular basis at home and let me know Dyslipidemia: Plan as described above Aortic insufficiency which is moderate but normal left ventricle size normal function we did talk in length about this and told her that symptoms of worsening of this problem will be shortness of breath swollen legs and ask her to let me know if she   Medication Adjustments/Labs and Tests Ordered: Current medicines are reviewed at length with the patient today.  Concerns regarding medicines are outlined above.  No orders of the defined types were placed  in this encounter.  Medication changes: No orders of the defined types were placed in this encounter.   Signed, Park Liter, MD, [facility] 02/20/2022 1:40 PM    [place]

## 2022-02-20 NOTE — Addendum Note (Signed)
Addended by: Baldo Ash D on: 02/20/2022 01:57 PM   Modules accepted: Orders

## 2022-04-05 LAB — LIPID PANEL
Chol/HDL Ratio: 2 ratio (ref 0.0–4.4)
Cholesterol, Total: 165 mg/dL (ref 100–199)
HDL: 81 mg/dL (ref >39)
LDL Chol Calc (NIH): 72 mg/dL (ref 0–99)
Triglycerides: 58 mg/dL (ref 0–149)
VLDL Cholesterol Cal: 12 mg/dL (ref 5–40)

## 2022-04-05 LAB — AST: AST: 23 IU/L (ref 0–40)

## 2022-04-05 LAB — ALT: ALT: 16 IU/L (ref 0–32)

## 2022-04-18 ENCOUNTER — Other Ambulatory Visit: Payer: Self-pay

## 2022-04-18 MED ORDER — ROSUVASTATIN CALCIUM 20 MG PO TABS: 20.0000 mg | ORAL_TABLET | Freq: Every day | ORAL | 3 refills | Status: DC

## 2022-04-28 ENCOUNTER — Emergency Department (HOSPITAL_BASED_OUTPATIENT_CLINIC_OR_DEPARTMENT_OTHER)
Admission: EM | Admit: 2022-04-28 | Discharge: 2022-04-28 | Disposition: A | Discharge status: Home / Self Care | Payer: Medicare Other | Attending: Emergency Medicine | Admitting: Emergency Medicine

## 2022-04-28 ENCOUNTER — Emergency Department (HOSPITAL_BASED_OUTPATIENT_CLINIC_OR_DEPARTMENT_OTHER): Payer: Medicare Other

## 2022-04-28 ENCOUNTER — Other Ambulatory Visit: Payer: Self-pay

## 2022-04-28 ENCOUNTER — Encounter (HOSPITAL_BASED_OUTPATIENT_CLINIC_OR_DEPARTMENT_OTHER): Payer: Self-pay | Admitting: Emergency Medicine

## 2022-04-28 DIAGNOSIS — R1011 Right upper quadrant pain: Secondary | ICD-10-CM | POA: Insufficient documentation

## 2022-04-28 DIAGNOSIS — Z7982 Long term (current) use of aspirin: Secondary | ICD-10-CM | POA: Insufficient documentation

## 2022-04-28 DIAGNOSIS — R112 Nausea with vomiting, unspecified: Secondary | ICD-10-CM | POA: Diagnosis not present

## 2022-04-28 DIAGNOSIS — R109 Unspecified abdominal pain: Secondary | ICD-10-CM | POA: Diagnosis present

## 2022-04-28 LAB — COMPREHENSIVE METABOLIC PANEL
ALT: 18 U/L (ref 0–44)
AST: 26 U/L (ref 15–41)
Albumin: 4.6 g/dL (ref 3.5–5.0)
Alkaline Phosphatase: 68 U/L (ref 38–126)
Anion gap: 9 (ref 5–15)
BUN: 15 mg/dL (ref 8–23)
CO2: 27 mmol/L (ref 22–32)
Calcium: 9.1 mg/dL (ref 8.9–10.3)
Chloride: 102 mmol/L (ref 98–111)
Creatinine, Ser: 0.75 mg/dL (ref 0.44–1.00)
GFR, Estimated: >60 mL/min (ref >60)
Glucose, Bld: 97 mg/dL (ref 70–99)
Potassium: 3.8 mmol/L (ref 3.5–5.1)
Sodium: 138 mmol/L (ref 135–145)
Total Bilirubin: 0.4 mg/dL (ref 0.3–1.2)
Total Protein: 7.4 g/dL (ref 6.5–8.1)

## 2022-04-28 LAB — CBC WITH DIFFERENTIAL/PLATELET
Abs Immature Granulocytes: 0.03 10*3/uL (ref 0.00–0.07)
Basophils Absolute: 0 10*3/uL (ref 0.0–0.1)
Basophils Relative: 0 %
Eosinophils Absolute: 0.2 10*3/uL (ref 0.0–0.5)
Eosinophils Relative: 3 %
HCT: 41.3 % (ref 36.0–46.0)
Hemoglobin: 14 g/dL (ref 12.0–15.0)
Immature Granulocytes: 0 %
Lymphocytes Relative: 22 %
Lymphs Abs: 1.8 10*3/uL (ref 0.7–4.0)
MCH: 31.5 pg (ref 26.0–34.0)
MCHC: 33.9 g/dL (ref 30.0–36.0)
MCV: 92.8 fL (ref 80.0–100.0)
Monocytes Absolute: 0.6 10*3/uL (ref 0.1–1.0)
Monocytes Relative: 8 %
Neutro Abs: 5.4 10*3/uL (ref 1.7–7.7)
Neutrophils Relative %: 67 %
Platelets: 283 10*3/uL (ref 150–400)
RBC: 4.45 MIL/uL (ref 3.87–5.11)
RDW: 12.1 % (ref 11.5–15.5)
WBC: 8.2 10*3/uL (ref 4.0–10.5)
nRBC: 0 % (ref 0.0–0.2)

## 2022-04-28 LAB — LIPASE, BLOOD: Lipase: 35 U/L (ref 11–51)

## 2022-04-28 MED ORDER — IOHEXOL 300 MG/ML  SOLN
100.0000 mL | Freq: Once | INTRAMUSCULAR | Status: AC | PRN
  Administered 2022-04-28: 100 mL via INTRAVENOUS

## 2022-04-28 MED ORDER — ONDANSETRON HCL 4 MG/2ML IJ SOLN
4.0000 mg | Freq: Once | INTRAMUSCULAR | Status: AC
  Administered 2022-04-28: 4 mg via INTRAVENOUS
  Filled 2022-04-28: qty 2

## 2022-04-28 MED ORDER — HYDROMORPHONE HCL 1 MG/ML IJ SOLN
1.0000 mg | Freq: Once | INTRAMUSCULAR | Status: AC
  Administered 2022-04-28: 1 mg via INTRAVENOUS
  Filled 2022-04-28: qty 1

## 2022-04-28 MED ORDER — ONDANSETRON 4 MG PO TBDP: 4.0000 mg | ORAL_TABLET | Freq: Three times a day (TID) | ORAL | 1 refills | Status: DC | PRN

## 2022-04-28 NOTE — ED Notes (Addendum)
Following administration of dilaudid patients O2 sats 89%. Patient repositioned in bed and 2L [place] applied. Patients O2 sats 97% at this time.

## 2022-04-28 NOTE — ED Notes (Signed)
Pt is aware she needs a urine sample 

## 2022-04-28 NOTE — ED Provider Notes (Addendum)
[facility] Provider Note   CSN: [id number] Arrival date & time: 04/28/22  1727     History  Chief Complaint  Patient presents with   Abdominal Pain    RUQ    [name] is a 76 y.o. female.  With acute onset of right-sided abdominal pain at 1500 today.  And feels bloated in that area.  Patient status post prior small bowel obstructions.  And also status post cholecystectomy and hysterectomy.  Patient no nausea or vomiting or diarrhea but stated had none in the past with the bowel obstruction.  No history of kidney stones.  Patient is a former smoker.  Quit in 2014.       Home Medications Prior to Admission medications   Medication Sig Start Date End Date Taking? Authorizing Provider  ondansetron (ZOFRAN-ODT) 4 MG disintegrating tablet Take 1 tablet (4 mg total) by mouth every 8 (eight) hours as needed. 04/28/22  Yes Fredia Sorrow, MD  amLODipine (NORVASC) 2.5 MG tablet Take 1 tablet (2.5 mg total) by mouth daily. 01/02/22   Marrian Salvage, FNP  aspirin EC 81 MG tablet Take 81 mg by mouth daily. Swallow whole.    [provider]  ergocalciferol (VITAMIN D2) 1.25 MG (50000 UT) capsule Take 1 capsule (50,000 Units total) by mouth once a week. 01/02/22   Marrian Salvage, FNP  rosuvastatin (CRESTOR) 20 MG tablet Take 1 tablet (20 mg total) by mouth daily. 04/18/22   Park Liter, MD  pravastatin (PRAVACHOL) 80 MG tablet Take 1 tablet (80 mg total) by mouth daily. 01/02/22 02/20/22  Marrian Salvage, FNP      Allergies    Avelox [moxifloxacin], Penicillins, Buprenorphine hcl, Morphine and related, Nitrofuran derivatives, Other, Procaine, Sulfa antibiotics, Sulfacetamide sodium, and Aspirin    Review of Systems   Review of Systems  Constitutional:  Negative for chills and fever.  HENT:  Negative for ear pain and sore throat.   Eyes:  Negative for pain and visual disturbance.  Respiratory:  Negative for cough  and shortness of breath.   Cardiovascular:  Negative for chest pain and palpitations.  Gastrointestinal:  Positive for abdominal pain. Negative for vomiting.  Genitourinary:  Positive for flank pain. Negative for dysuria and hematuria.  Musculoskeletal:  Negative for arthralgias and back pain.  Skin:  Negative for color change and rash.  Neurological:  Negative for seizures and syncope.  All other systems reviewed and are negative.   Physical Exam Updated Vital Signs BP (!) 181/78   Pulse 64   Temp 97.6 F (36.4 C) (Oral)   Resp 19   Ht 1.575 m (5\' 2" )   Wt 50.8 kg   SpO2 99%   BMI 20.49 kg/m  Physical Exam Vitals and nursing note reviewed.  Constitutional:      General: She is not in acute distress.    Appearance: She is well-developed. She is not ill-appearing.  HENT:     Head: Normocephalic and atraumatic.     Mouth/Throat:     Mouth: Mucous membranes are moist.  Eyes:     Conjunctiva/sclera: Conjunctivae normal.  Cardiovascular:     Rate and Rhythm: Normal rate and regular rhythm.     Heart sounds: No murmur heard. Pulmonary:     Effort: Pulmonary effort is normal. No respiratory distress.     Breath sounds: Normal breath sounds.  Abdominal:     Palpations: Abdomen is soft.     Tenderness: There is abdominal  tenderness in the right upper quadrant and right lower quadrant. There is guarding.     Hernia: No hernia is present.  Musculoskeletal:        General: No swelling.     Cervical back: Neck supple.  Skin:    General: Skin is warm and dry.     Capillary Refill: Capillary refill takes less than 2 seconds.  Neurological:     General: No focal deficit present.     Mental Status: She is alert.  Psychiatric:        Mood and Affect: Mood normal.     ED Results / Procedures / Treatments   Labs (all labs ordered are listed, but only abnormal results are displayed) Labs Reviewed  CBC WITH DIFFERENTIAL/PLATELET  COMPREHENSIVE METABOLIC PANEL  LIPASE, BLOOD   URINALYSIS, ROUTINE W REFLEX MICROSCOPIC    EKG None  Radiology CT Abdomen Pelvis W Contrast  Result Date: 04/28/2022 CLINICAL DATA:  Right-sided abdominal pain, right flank pain for 2 hours, bowel obstruction suspected EXAM: CT ABDOMEN AND PELVIS WITH CONTRAST TECHNIQUE: Multidetector CT imaging of the abdomen and pelvis was performed using the standard protocol following bolus administration of intravenous contrast. RADIATION DOSE REDUCTION: This exam was performed according to the departmental dose-optimization program which includes automated exposure control, adjustment of the mA and/or kV according to patient size and/or use of iterative reconstruction technique. CONTRAST:  OMNIPAQUE IOHEXOL 300 MG/ML  SOLN COMPARISON:  CT abdomen pelvis, 04/14/2015 FINDINGS: Lower chest: No acute abnormality.  Coronary artery calcifications. Hepatobiliary: Subcentimeter hypodense lesion of the anterior left lobe of the liver, involuted compared to prior examination dated 2016, consistent with a benign involuted cyst or hemangioma (series 2, image 20). Status post cholecystectomy. Mild postoperative biliary dilatation. Pancreas: Unremarkable. No pancreatic ductal dilatation or surrounding inflammatory changes. Spleen: Normal in size without significant abnormality. Adrenals/Urinary Tract: Adrenal glands are unremarkable. Numerous small renal cortical cysts and innumerable additional subcentimeter low-attenuation lesions, too small to characterize although likely additional tiny cysts. No further follow-up or characterization is required for these benign lesions. Kidneys are otherwise normal, without renal calculi, solid lesion, or hydronephrosis. Bladder is unremarkable. Stomach/Bowel: Stomach is within normal limits. Cecum and ascending colon are anterior to the liver, as is the normal appendix. No evidence of bowel wall thickening, distention, or inflammatory changes. Descending and sigmoid diverticulosis.  Vascular/Lymphatic: Aortic atherosclerosis. No enlarged abdominal or pelvic lymph nodes. Reproductive: Status post hysterectomy. Other: No abdominal wall hernia or abnormality. No ascites. Musculoskeletal: No acute or significant osseous findings. IMPRESSION: 1. No acute CT findings of the abdomen or pelvis to explain right-sided abdominal pain. Specifically, no evidence of bowel obstruction. 2. Cecum and ascending colon are anterior to the liver, as is the normal appendix. 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 3. Descending and sigmoid diverticulosis without evidence of acute diverticulitis. 4. Status post cholecystectomy and hysterectomy. 5. Coronary artery disease. Aortic Atherosclerosis (ICD10-I70.0). Electronically Signed   By: Jearld Lesch M.D.   On: 04/28/2022 20:33    Procedures Procedures    Medications Ordered in ED Medications  ondansetron (ZOFRAN) injection 4 mg (4 mg Intravenous Given 04/28/22 1918)  HYDROmorphone (DILAUDID) injection 1 mg (1 mg Intravenous Given 04/28/22 1918)  iohexol (OMNIPAQUE) 300 MG/ML solution 100 mL (100 mLs Intravenous Contrast Given 04/28/22 2009)  ondansetron (ZOFRAN) injection 4 mg (4 mg Intravenous Given 04/28/22 2103)    ED Course/ Medical Decision Making/ A&P  Medical Decision Making Amount and/or Complexity of Data Reviewed Labs: ordered. Radiology: ordered.  Risk Prescription drug management.   Clinical concerns and sudden onset for small bowel obstruction perhaps may be kidney stone.  Tenderness is more so to the right flank than the right upper quadrant right lower quadrant area.  CBC no leukocytosis hemoglobin is normal.  Complete metabolic panel lipase is pending.  We will get CT scan abdomen and pelvis patient does not have a dye allergy.  We will treat with hydromorphone and Zofran.  Work-up without any acute findings.  No leukocytosis hemoglobin normal.  Complete metabolic panel  normal.  Lipase normal.  Electrolytes normal.  Liver function test normal.  CT scan abdomen pelvis with no acute CT findings of the abdomen or pelvis to explain the right-sided abdominal pain.  No evidence of bowel obstruction.  Patient has cecum and ascending colon are anterior to the liver as it is the normal appendix this configuration potentially symptomatic.  Does not appear to be symptomatic here today.  No other acute findings.  Patient's had a hysterectomy and gallbladder removal.  Went into discharge patient and now she is having problems with vomiting.  So we will give her another dose of Zofran.  Pain is improved significantly.  We will treat symptomatically.  Patient with some better control with the vomiting from the Zofran.  Abdominal pain is resolved.  Offered patient IV fluids watch her longer but she wants to go home.  She has Zofran available at home new prescription will be provided.  Suspect that the abdominal pain may be was the onset of a viral gastritis.  CT scan did not give any explanation for the pain.  Pain is now completely resolved.  Patient's oxygen saturations have been a little borderline but most recently on 1 L she was satting 99%.  Patient may have some underlying COPD or emphysema used to be a smoker.  But does not have any immediate need for home oxygen at this time.  Patient will return for any new or worse symptoms or persistent vomiting.   Final Clinical Impression(s) / ED Diagnoses Final diagnoses:  Right upper quadrant abdominal pain  Nausea and vomiting, unspecified vomiting type    Rx / DC Orders ED Discharge Orders          Ordered    ondansetron (ZOFRAN-ODT) 4 MG disintegrating tablet  Every 8 hours PRN        04/28/22 2321              Vanetta Mulders, MD 04/28/22 Izell [place]am[place]    Vanetta Mulders, MD 04/28/22 6433    Vanetta Mulders, MD 04/28/22 [phone]

## 2022-04-28 NOTE — ED Triage Notes (Signed)
RUQ pain x 2 hours , obvious distress. Denies NV . Last BM yesterday

## 2022-04-28 NOTE — Discharge Instructions (Signed)
Work-up for the abdominal pain without any acute findings.  The nausea and vomiting may represent a viral stomach bug.  Take the Zofran as needed.  If vomiting persist or any new or worse symptoms return.  Today's labs without any significant abnormality.  CT scan without any explanation for the abdominal pain.

## 2022-04-28 NOTE — ED Notes (Signed)
Patient noted to have N/V. EDP placed orders for zofran.

## 2022-07-10 ENCOUNTER — Ambulatory Visit (INDEPENDENT_AMBULATORY_CARE_PROVIDER_SITE_OTHER): Payer: Medicare Other | Admitting: Family

## 2022-07-10 ENCOUNTER — Encounter: Payer: Self-pay | Admitting: Family

## 2022-07-10 VITALS — BP 148/88 | HR 73 | Temp 98.1°F | Resp 18 | Ht 62.0 in | Wt 114.4 lb

## 2022-07-10 DIAGNOSIS — I10 Essential (primary) hypertension: Secondary | ICD-10-CM | POA: Diagnosis not present

## 2022-07-10 DIAGNOSIS — J309 Allergic rhinitis, unspecified: Secondary | ICD-10-CM

## 2022-07-10 DIAGNOSIS — E7800 Pure hypercholesterolemia, unspecified: Secondary | ICD-10-CM | POA: Diagnosis not present

## 2022-07-10 DIAGNOSIS — R739 Hyperglycemia, unspecified: Secondary | ICD-10-CM

## 2022-07-10 MED ORDER — FLUTICASONE PROPIONATE 50 MCG/ACT NA SUSP: 2.0000 | Freq: Every day | NASAL | 6 refills | Status: DC

## 2022-07-10 NOTE — Progress Notes (Signed)
[name] is a 76 y.o. 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 90 tablet 3   aspirin EC 81 MG tablet Take 81 mg by mouth daily. Swallow whole.     ergocalciferol (VITAMIN D2) 1.25 MG (50000 UT) capsule Take 1 capsule (50,000 Units total) by mouth once a week. 12 capsule 3   fluticasone (FLONASE) 50 MCG/ACT nasal spray Place 2 sprays into both nostrils daily. 16 g 6   ketorolac (ACULAR) 0.5 % ophthalmic solution Place 1 drop into the right eye 3 (three) times daily.     ondansetron (ZOFRAN-ODT) 4 MG disintegrating tablet Take 1 tablet (4 mg total) by mouth every 8 (eight) hours as needed. 12 tablet 1   prednisoLONE acetate (PRED FORTE) 1 % ophthalmic suspension Place 1 drop into the right eye in the morning, at noon, and at bedtime.     rosuvastatin (CRESTOR) 20 MG tablet Take 1 tablet (20 mg total) by mouth daily. 90 tablet 3   No current facility-administered medications for this visit.    Allergies: Avelox [moxifloxacin], Penicillins, Buprenorphine hcl, Morphine and related, Nitrofuran derivatives, Other, Procaine, Sulfa antibiotics, Sulfacetamide sodium, and Aspirin  Past Medical History:  Diagnosis Date   Arthritis    High cholesterol     Hypertension     Past Surgical History:  Procedure Laterality Date   ABDOMINAL HYSTERECTOMY     CHOLECYSTECTOMY N/A 01/21/2015   Procedure: LAPAROSCOPIC CHOLECYSTECTOMY;  Surgeon: Jimmye Norman, MD;  Location: [facility];  Service: General;  Laterality: N/A;   FRACTURE SURGERY     INTRAMEDULLARY (IM) NAIL INTERTROCHANTERIC Left 11/27/2015   Procedure: INTRAMEDULLARY (IM) NAIL INTERTROCHANTRIC;  Surgeon: Eldred Manges, MD;  Location: [facility];  Service: Orthopedics;  Laterality: Left;   LAPAROSCOPY N/A 04/15/2015   Procedure: LAPAROSCOPY DIAGNOSTIC ileocalpexy ;  Surgeon: Karie Soda, MD;  Location: [facility];  Service: General;  Laterality: N/A;   SMALL INTESTINE SURGERY Right    TONSILLECTOMY     TUBAL LIGATION  08/13/1978    Family History  Problem Relation Age of Onset   Stroke Mother     Social History   Tobacco Use   Smoking status: Former    Packs/day: 1.00    Years: 40.00    Total pack years: 40.00    Types: Cigarettes, E-cigarettes    Quit date: 01/11/2013    Years since quitting: 9.4   Smokeless tobacco: Never  Substance Use Topics   Alcohol use: Not Currently    Comment: 1 beer/day    Subjective:  Presents for 6 month follow up on chronic care needs;  Trying to be more regular with her medications; known white coat hypertension; home readings are averaging in the 130s/ 70s; does occasionally see readings in the 140s;  Denies any chest pain,  shortness of breath, blurred vision or headache  Has had a cough for the past week- does feel improving; did recently fly to [place]; feels drainage in back of her throat;    Objective:  Vitals:   07/10/22 0947  BP: (!) 148/88  Pulse: 73  Resp: 18  Temp: 98.1 F (36.7 C)  SpO2: 97%  Weight: 114 lb 6.4 oz (51.9 kg)  Height: 5\' 2"  (1.575 m)    General: Well developed, well nourished, in no acute distress  Skin : Warm and dry.  Head: Normocephalic and atraumatic  Eyes: Sclera and conjunctiva clear; pupils round and reactive to light;  extraocular movements intact  Ears: External normal; canals clear; tympanic membranes normal  Oropharynx: Pink, supple. No suspicious lesions  Neck: Supple without thyromegaly, adenopathy  Lungs: Respirations unlabored; clear to auscultation bilaterally without wheeze, rales, rhonchi  CVS exam: normal rate and regular rhythm.  Neurologic: Alert and oriented; speech intact; face symmetrical; moves all extremities well; CNII-XII intact without focal deficit   Assessment:  1. Essential (primary) hypertension   2. Hypercholesteremia   3. Hyperglycemia   4. Allergic rhinitis, unspecified seasonality, unspecified trigger     Plan:  Known white coat HTN; continue to monitor at home; notify if consistently above 140/ 90; Under care of cardiology- due to see in August 2024; Continue to monitor- glucose was 97 when checked in September 2023; Cough is improving; Trial of Flonase NS; follow up worse, no better;    Return in about 6 months (around 01/08/2023).  No orders of the defined types were placed in this encounter.   Requested Prescriptions   Signed Prescriptions Disp Refills   fluticasone (FLONASE) 50 MCG/ACT nasal spray 16 g 6    Sig: Place 2 sprays into both nostrils daily.

## 2022-11-23 ENCOUNTER — Telehealth: Payer: Self-pay | Admitting: Family

## 2022-11-23 NOTE — Telephone Encounter (Signed)
Contacted Zynia H Starkel to schedule their annual wellness visit. Appointment made for 12/12/2022.  Verlee Rossetti; Care Guide Ambulatory Clinical Support [facility] Direct Dial: [phone]

## 2022-11-28 ENCOUNTER — Other Ambulatory Visit: Payer: Self-pay | Admitting: Family

## 2022-12-12 ENCOUNTER — Ambulatory Visit (INDEPENDENT_AMBULATORY_CARE_PROVIDER_SITE_OTHER): Payer: Medicare Other | Admitting: *Deleted

## 2022-12-12 VITALS — Ht 62.0 in | Wt 114.0 lb

## 2022-12-12 DIAGNOSIS — Z0000 Encounter for general adult medical examination without abnormal findings: Secondary | ICD-10-CM

## 2022-12-12 NOTE — Patient Instructions (Signed)
[name] , Thank you for taking time to come for your Medicare Wellness Visit. I appreciate your ongoing commitment to your health goals. Please review the following plan we discussed and let me know if I can assist you in the future.     This is a list of the screening recommended for you and due dates:  Health Maintenance  Topic Date Due   COVID-19 Vaccine (1) Never done   Hepatitis C Screening: USPSTF Recommendation to screen - Ages 81-75 yo.  Never done   Mammogram  Never done   DEXA scan (bone density measurement)  Never done   DTaP/Tdap/Td vaccine (3 - Tdap) 04/01/2021   Screening for Lung Cancer  01/03/2023   Medicare Annual Wellness Visit  12/12/2023   Cologuard (Stool DNA test)  08/16/2024   Pneumonia Vaccine  Completed   HPV Vaccine  Aged Out   Flu Shot  Discontinued   Zoster (Shingles) Vaccine  Discontinued    Next appointment: Follow up in one year for your annual wellness visit.   Preventive Care 64 Years and Older, Female Preventive care refers to lifestyle choices and visits with your health care provider that can promote health and wellness. What does preventive care include? A yearly physical exam. This is also called an annual well check. Dental exams once or twice a year. Routine eye exams. Ask your health care provider how often you should have your eyes checked. Personal lifestyle choices, including: Daily care of your teeth and gums. Regular physical activity. Eating a healthy diet. Avoiding tobacco and drug use. Limiting alcohol use. Practicing safe sex. Taking low-dose aspirin every day. Taking vitamin and mineral supplements as recommended by your health care provider. What happens during an annual well check? The services and screenings done by your health care provider during your annual well check will depend on your age, overall health, lifestyle risk factors, and family history of disease. Counseling  Your health care provider may ask you  questions about your: Alcohol use. Tobacco use. Drug use. Emotional well-being. Home and relationship well-being. Sexual activity. Eating habits. History of falls. Memory and ability to understand (cognition). Work and work Astronomer. Reproductive health. Screening  You may have the following tests or measurements: Height, weight, and BMI. Blood pressure. Lipid and cholesterol levels. These may be checked every 5 years, or more frequently if you are over 66 years old. Skin check. Lung cancer screening. You may have this screening every year starting at age 24 if you have a 30-pack-year history of smoking and currently smoke or have quit within the past 15 years. Fecal occult blood test (FOBT) of the stool. You may have this test every year starting at age 50. Flexible sigmoidoscopy or colonoscopy. You may have a sigmoidoscopy every 5 years or a colonoscopy every 10 years starting at age 20. Hepatitis C blood test. Hepatitis B blood test. Sexually transmitted disease (STD) testing. Diabetes screening. This is done by checking your blood sugar (glucose) after you have not eaten for a while (fasting). You may have this done every 1-3 years. Bone density scan. This is done to screen for osteoporosis. You may have this done starting at age 89. Mammogram. This may be done every 1-2 years. Talk to your health care provider about how often you should have regular mammograms. Talk with your health care provider about your test results, treatment options, and if necessary, the need for more tests. Vaccines  Your health care provider may recommend certain vaccines, such  as: Influenza vaccine. This is recommended every year. Tetanus, diphtheria, and acellular pertussis (Tdap, Td) vaccine. You may need a Td booster every 10 years. Zoster vaccine. You may need this after age 7. Pneumococcal 13-valent conjugate (PCV13) vaccine. One dose is recommended after age 41. Pneumococcal polysaccharide  (PPSV23) vaccine. One dose is recommended after age 87. Talk to your health care provider about which screenings and vaccines you need and how often you need them. This information is not intended to replace advice given to you by your health care provider. Make sure you discuss any questions you have with your health care provider. Document Released: 08/26/2015 Document Revised: 04/18/2016 Document Reviewed: 05/31/2015 Elsevier Interactive Patient Education  2017 [employer] Prevention in the Home Falls can cause injuries. They can happen to people of all ages. There are many things you can do to make your home safe and to help prevent falls. What can I do on the outside of my home? Regularly fix the edges of walkways and driveways and fix any cracks. Remove anything that might make you trip as you walk through a door, such as a raised step or threshold. Trim any bushes or trees on the path to your home. Use bright outdoor lighting. Clear any walking paths of anything that might make someone trip, such as rocks or tools. Regularly check to see if handrails are loose or broken. Make sure that both sides of any steps have handrails. Any raised decks and porches should have guardrails on the edges. Have any leaves, snow, or ice cleared regularly. Use sand or salt on walking paths during winter. Clean up any spills in your garage right away. This includes oil or grease spills. What can I do in the bathroom? Use night lights. Install grab bars by the toilet and in the tub and shower. Do not use towel bars as grab bars. Use non-skid mats or decals in the tub or shower. If you need to sit down in the shower, use a plastic, non-slip stool. Keep the floor dry. Clean up any water that spills on the floor as soon as it happens. Remove soap buildup in the tub or shower regularly. Attach bath mats securely with double-sided non-slip rug tape. Do not have throw rugs and other things on the  floor that can make you trip. What can I do in the bedroom? Use night lights. Make sure that you have a light by your bed that is easy to reach. Do not use any sheets or blankets that are too big for your bed. They should not hang down onto the floor. Have a firm chair that has side arms. You can use this for support while you get dressed. Do not have throw rugs and other things on the floor that can make you trip. What can I do in the kitchen? Clean up any spills right away. Avoid walking on wet floors. Keep items that you use a lot in easy-to-reach places. If you need to reach something above you, use a strong step stool that has a grab bar. Keep electrical cords out of the way. Do not use floor polish or wax that makes floors slippery. If you must use wax, use non-skid floor wax. Do not have throw rugs and other things on the floor that can make you trip. What can I do with my stairs? Do not leave any items on the stairs. Make sure that there are handrails on both sides of the stairs and use  them. Fix handrails that are broken or loose. Make sure that handrails are as long as the stairways. Check any carpeting to make sure that it is firmly attached to the stairs. Fix any carpet that is loose or worn. Avoid having throw rugs at the top or bottom of the stairs. If you do have throw rugs, attach them to the floor with carpet tape. Make sure that you have a light switch at the top of the stairs and the bottom of the stairs. If you do not have them, ask someone to add them for you. What else can I do to help prevent falls? Wear shoes that: Do not have high heels. Have rubber bottoms. Are comfortable and fit you well. Are closed at the toe. Do not wear sandals. If you use a stepladder: Make sure that it is fully opened. Do not climb a closed stepladder. Make sure that both sides of the stepladder are locked into place. Ask someone to hold it for you, if possible. Clearly mark and make  sure that you can see: Any grab bars or handrails. First and last steps. Where the edge of each step is. Use tools that help you move around (mobility aids) if they are needed. These include: Canes. Walkers. Scooters. Crutches. Turn on the lights when you go into a dark area. Replace any light bulbs as soon as they burn out. Set up your furniture so you have a clear path. Avoid moving your furniture around. If any of your floors are uneven, fix them. If there are any pets around you, be aware of where they are. Review your medicines with your doctor. Some medicines can make you feel dizzy. This can increase your chance of falling. Ask your doctor what other things that you can do to help prevent falls. This information is not intended to replace advice given to you by your health care provider. Make sure you discuss any questions you have with your health care provider. Document Released: 05/26/2009 Document Revised: 01/05/2016 Document Reviewed: 09/03/2014 Elsevier Interactive Patient Education  2017 [employer].

## 2022-12-12 NOTE — Progress Notes (Signed)
Subjective:  Pt completed ADLs, Fall risk, and SDOH during e-check in on 12/11/22.  Answers verified with pt.    [name] is a 77 y.o. female who presents for Medicare Annual (Subsequent) preventive examination.  I connected with  [name] on 12/12/22 by a audio enabled telemedicine application and verified that I am speaking with the correct person using two identifiers.  Patient Location: Home  Provider Location: Office/Clinic  I discussed the limitations of evaluation and management by telemedicine. The patient expressed understanding and agreed to proceed.   Review of Systems     Cardiac Risk Factors include: advanced age (>35men, >56 women);dyslipidemia;hypertension     Objective:    Today's Vitals   12/12/22 0943  Weight: 114 lb (51.7 kg)  Height: 5\' 2"  (1.575 m)   Body mass index is 20.85 kg/m.     12/12/2022    9:42 AM 11/23/2021   11:00 AM 08/17/2021    4:08 PM 05/06/2020    5:42 PM 11/27/2015    9:40 AM 11/26/2015    5:00 PM 04/14/2015    1:17 PM  Advanced Directives  Does Patient Have a Medical Advance Directive? Yes Yes No No No No No  Type of Advance Directive Living will Healthcare Power of [place];Living will       Copy of Healthcare Power of Attorney in Chart?  No - copy requested       Would patient like information on creating a medical advance directive?   No - Patient declined  No - patient declined information No - patient declined information     Current Medications (verified) Outpatient Encounter Medications as of 12/12/2022  Medication Sig   amLODipine (NORVASC) 2.5 MG tablet Take 1 tablet (2.5 mg total) by mouth daily.   aspirin EC 81 MG tablet Take 81 mg by mouth daily. Swallow whole.   fluticasone (FLONASE) 50 MCG/ACT nasal spray Place 2 sprays into both nostrils daily.   ketorolac (ACULAR) 0.5 % ophthalmic solution Place 1 drop into the right eye 3 (three) times daily.   prednisoLONE acetate (PRED FORTE) 1 % ophthalmic  suspension Place 1 drop into the right eye in the morning, at noon, and at bedtime.   rosuvastatin (CRESTOR) 20 MG tablet Take 1 tablet (20 mg total) by mouth daily.   Vitamin D, Ergocalciferol, (DRISDOL) 1.25 MG (50000 UNIT) CAPS capsule TAKE 1 CAPSULE ONCE A WEEK   [DISCONTINUED] ondansetron (ZOFRAN-ODT) 4 MG disintegrating tablet Take 1 tablet (4 mg total) by mouth every 8 (eight) hours as needed.   [DISCONTINUED] pravastatin (PRAVACHOL) 80 MG tablet Take 1 tablet (80 mg total) by mouth daily.   No facility-administered encounter medications on file as of 12/12/2022.    Allergies (verified) Avelox [moxifloxacin], Penicillins, Buprenorphine hcl, Morphine and related, Nitrofuran derivatives, Other, Procaine, Sulfa antibiotics, Sulfacetamide sodium, and Aspirin   History: Past Medical History:  Diagnosis Date   Arthritis    High cholesterol    Hypertension    Past Surgical History:  Procedure Laterality Date   ABDOMINAL HYSTERECTOMY     CHOLECYSTECTOMY N/A 01/21/2015   Procedure: LAPAROSCOPIC CHOLECYSTECTOMY;  Surgeon: Jimmye Norman, MD;  Location: [facility] OR;  Service: General;  Laterality: N/A;   COSMETIC SURGERY     FRACTURE SURGERY     INTRAMEDULLARY (IM) NAIL INTERTROCHANTERIC Left 11/27/2015   Procedure: INTRAMEDULLARY (IM) NAIL INTERTROCHANTRIC;  Surgeon: Eldred Manges, MD;  Location: [facility];  Service: Orthopedics;  Laterality: Left;   LAPAROSCOPY N/A 04/15/2015  Procedure: LAPAROSCOPY DIAGNOSTIC ileocalpexy ;  Surgeon: Karie Soda, MD;  Location: [facility];  Service: General;  Laterality: N/A;   SMALL INTESTINE SURGERY Right    TONSILLECTOMY     TUBAL LIGATION  08/13/1978   Family History  Problem Relation Age of Onset   Stroke Mother    Social History   Socioeconomic History   Marital status: Married    Spouse name: Alinda Money   Number of children: 3   Years of education: Not on file   Highest education level: Not on file  Occupational History   Not on file  Tobacco Use    Smoking status: Former    Packs/day: 1.00    Years: 40.00    Additional pack years: 0.00    Total pack years: 40.00    Types: Cigarettes, E-cigarettes    Quit date: 01/11/2013    Years since quitting: 9.9   Smokeless tobacco: Never  Substance and Sexual Activity   Alcohol use: Not Currently    Comment: 1 beer/day   Drug use: Never   Sexual activity: Not Currently    Birth control/protection: Surgical  Other Topics Concern   Not on file  Social History Narrative   3 sons   10 grandchildren   3 great grandchildren   Recently got back from [place] seeing granddaughter.   Married x 30 years.   Social Determinants of Health   Financial Resource Strain: Low Risk  (12/11/2022)   Overall Financial Resource Strain (CARDIA)    Difficulty of Paying Living Expenses: Not hard at all  Food Insecurity: No Food Insecurity (12/11/2022)   Hunger Vital Sign    Worried About Running Out of Food in the Last Year: Never true    Ran Out of Food in the Last Year: Never true  Transportation Needs: No Transportation Needs (12/11/2022)   PRAPARE - Administrator, Civil Service (Medical): No    Lack of Transportation (Non-Medical): No  Physical Activity: Insufficiently Active (12/11/2022)   Exercise Vital Sign    Days of Exercise per Week: 1 day    Minutes of Exercise per Session: 10 min  Stress: No Stress Concern Present (12/11/2022)   [employer] of Occupational Health - Occupational Stress Questionnaire    Feeling of Stress : Not at all  Social Connections: Unknown (12/11/2022)   Social Connection and Isolation Panel [NHANES]    Frequency of Communication with Friends and Family: Twice a week    Frequency of Social Gatherings with Friends and Family: Once a week    Attends Religious Services: Not on Marketing executive or Organizations: No    Attends Banker Meetings: Never    Marital Status: Married    Tobacco Counseling Counseling given: Not  Answered   Clinical Intake:  Pre-visit preparation completed: Yes  Pain : No/denies pain  BMI - recorded: 20.85 Nutritional Status: BMI of 19-24  Normal Nutritional Risks: None Diabetes: No  How often do you need to have someone help you when you read instructions, pamphlets, or other written materials from your doctor or pharmacy?: 1 - Never  Activities of Daily Living    12/11/2022    4:47 PM  In your present state of health, do you have any difficulty performing the following activities:  Hearing? 0  Vision? 0  Difficulty concentrating or making decisions? 0  Walking or climbing stairs? 0  Dressing or bathing? 0  Doing errands, shopping? 0  Preparing Food  and eating ? N  Using the Toilet? N  In the past six months, have you accidently leaked urine? N  Do you have problems with loss of bowel control? N  Managing your Medications? N  Managing your Finances? N  Housekeeping or managing your Housekeeping? N    Patient Care Team: Olive Bass, FNP as PCP - General (Internal Medicine)  Indicate any recent Medical Services you may have received from other than [facility] providers in the past year (date may be approximate).     Assessment:   This is a routine wellness examination for [name].  Hearing/Vision screen No results found.  Dietary issues and exercise activities discussed: Current Exercise Habits: The patient does not participate in regular exercise at present, Exercise limited by: None identified   Goals Addressed   None    Depression Screen    12/12/2022    9:44 AM 07/10/2022    9:52 AM 01/02/2022    9:15 AM 11/23/2021   10:57 AM 08/31/2021    9:24 AM 08/03/2021    8:50 AM  PHQ 2/9 Scores  PHQ - 2 Score 0 0 0 0 0 0    Fall Risk    12/11/2022    4:47 PM 07/10/2022    9:52 AM 01/02/2022    9:15 AM 11/23/2021   11:00 AM 08/31/2021    9:24 AM  Fall Risk   Falls in the past year? 0 0 0 0 0  Number falls in past yr: 0 0 0 0 0  Injury with  Fall? 0 0 0 0 0  Risk for fall due to : No Fall Risks History of fall(s)  No Fall Risks No Fall Risks  Follow up Falls evaluation completed Falls evaluation completed  Falls prevention discussed Falls evaluation completed    FALL RISK PREVENTION PERTAINING TO THE HOME:  Any stairs in or around the home? Yes  If so, are there any without handrails? Yes  Home free of loose throw rugs in walkways, pet beds, electrical cords, etc? Yes  Adequate lighting in your home to reduce risk of falls? Yes   ASSISTIVE DEVICES UTILIZED TO PREVENT FALLS:  Life alert? No  Use of a cane, walker or w/c? No  Grab bars in the bathroom? No  Shower chair or bench in shower? No  Elevated toilet seat or a handicapped toilet? No   TIMED UP AND GO:  Was the test performed?  No, audio visit .    Cognitive Function:        12/12/2022    9:48 AM 11/23/2021   11:03 AM  6CIT Screen  What Year? 0 points 0 points  What month? 0 points 0 points  What time? 0 points 0 points  Count back from 20 0 points 0 points  Months in reverse 0 points 0 points  Repeat phrase 2 points 0 points  Total Score 2 points 0 points    Immunizations Immunization History  Administered Date(s) Administered   DTaP 04/02/2011, 04/02/2011   Influenza, High Dose Seasonal PF 07/05/2015, 07/05/2015   Influenza,inj,Quad PF,6+ Mos 05/16/2016, 07/26/2017   Influenza-Unspecified 06/11/2013, 07/05/2015, 05/16/2016   Pneumococcal Conjugate-13 10/03/2015   Pneumococcal Polysaccharide-23 04/02/2011, 01/15/2017    TDAP status: Due, Education has been provided regarding the importance of this vaccine. Advised may receive this vaccine at local pharmacy or Health Dept. Aware to provide a copy of the vaccination record if obtained from local pharmacy or Health Dept. Verbalized acceptance and understanding.  Flu  Vaccine status: Declined, Education has been provided regarding the importance of this vaccine but patient still declined. Advised may  receive this vaccine at local pharmacy or Health Dept. Aware to provide a copy of the vaccination record if obtained from local pharmacy or Health Dept. Verbalized acceptance and understanding.  Pneumococcal vaccine status: Up to date  Covid-19 vaccine status: Information provided on how to obtain vaccines.   Qualifies for Shingles Vaccine? Yes   Zostavax completed No   Shingrix Completed?: No.    Education has been provided regarding the importance of this vaccine. Patient has been advised to call insurance company to determine out of pocket expense if they have not yet received this vaccine. Advised may also receive vaccine at local pharmacy or Health Dept. Verbalized acceptance and understanding.  Screening Tests Health Maintenance  Topic Date Due   COVID-19 Vaccine (1) Never done   Hepatitis C Screening  Never done   MAMMOGRAM  Never done   DEXA SCAN  Never done   DTaP/Tdap/Td (3 - Tdap) 04/01/2021   Medicare Annual Wellness (AWV)  11/24/2022   Lung Cancer Screening  01/03/2023   Fecal DNA (Cologuard)  08/16/2024   Pneumonia Vaccine 21+ Years old  Completed   HPV VACCINES  Aged Out   INFLUENZA VACCINE  Discontinued   Zoster Vaccines- Shingrix  Discontinued    Health Maintenance  Health Maintenance Due  Topic Date Due   COVID-19 Vaccine (1) Never done   Hepatitis C Screening  Never done   MAMMOGRAM  Never done   DEXA SCAN  Never done   DTaP/Tdap/Td (3 - Tdap) 04/01/2021   Medicare Annual Wellness (AWV)  11/24/2022   Lung Cancer Screening  01/03/2023    Colorectal cancer screening: Type of screening: Cologuard. Completed 08/16/21. Repeat every 3 years  Mammogram status: No longer required due to PT DECLINED.  Bone Density status: PT DECLINED.  Lung Cancer Screening: (Low Dose CT Chest recommended if Age 12-80 years, 30 pack-year currently smoking OR have quit w/in 15years.) does qualify.   Lung Cancer Screening Referral: last chest ct completed on 01/02/22  Additional  Screening:  Hepatitis C Screening: does qualify; Completed N/a  Vision Screening: Recommended annual ophthalmology exams for early detection of glaucoma and other disorders of the eye. Is the patient up to date with their annual eye exam?  Yes  Who is the provider or what is the name of the office in which the patient attends annual eye exams? Dr. Waldo Laine If pt is not established with a provider, would they like to be referred to a provider to establish care? No .   Dental Screening: Recommended annual dental exams for proper oral hygiene  Community Resource Referral / Chronic Care Management: CRR required this visit?  No   CCM required this visit?  No      Plan:     I have personally reviewed and noted the following in the patient's chart:   Medical and social history Use of alcohol, tobacco or illicit drugs  Current medications and supplements including opioid prescriptions. Patient is not currently taking opioid prescriptions. Functional ability and status Nutritional status Physical activity Advanced directives List of other physicians Hospitalizations, surgeries, and ER visits in previous 12 months Vitals Screenings to include cognitive, depression, and falls Referrals and appointments  In addition, I have reviewed and discussed with patient certain preventive protocols, quality metrics, and best practice recommendations. A written personalized care plan for preventive services as well as general preventive health  recommendations were provided to patient.   Due to this being a telephonic visit, the after visit summary with patients personalized plan was offered to patient via mail or my-chart. Patient would like to access on my-chart.  Donne Anon, [place]   12/12/2022   Nurse Notes: None

## 2023-01-08 ENCOUNTER — Encounter: Payer: Self-pay | Admitting: Family

## 2023-01-08 ENCOUNTER — Ambulatory Visit (INDEPENDENT_AMBULATORY_CARE_PROVIDER_SITE_OTHER): Payer: Medicare Other | Admitting: Family

## 2023-01-08 VITALS — BP 156/78 | HR 70 | Ht 62.0 in | Wt 116.4 lb

## 2023-01-08 DIAGNOSIS — I700 Atherosclerosis of aorta: Secondary | ICD-10-CM | POA: Diagnosis not present

## 2023-01-08 DIAGNOSIS — Z720 Tobacco use: Secondary | ICD-10-CM | POA: Diagnosis not present

## 2023-01-08 DIAGNOSIS — I10 Essential (primary) hypertension: Secondary | ICD-10-CM

## 2023-01-08 NOTE — Patient Instructions (Signed)
Take 1 Amlodipine in the morning and 1 Amlodipine in the evening- please send MyChart message in about a week;

## 2023-01-08 NOTE — Progress Notes (Signed)
[name] is a 77 y.o. 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 90 tablet 3   aspirin EC 81 MG tablet Take 81 mg by mouth daily. Swallow whole.     fluticasone (FLONASE) 50 MCG/ACT nasal spray Place 2 sprays into both nostrils daily. 16 g 6   ketorolac (ACULAR) 0.5 % ophthalmic solution Place 1 drop into the right eye 3 (three) times daily.     prednisoLONE acetate (PRED FORTE) 1 % ophthalmic suspension Place 1 drop into the right eye in the morning, at noon, and at bedtime.     rosuvastatin (CRESTOR) 20 MG tablet Take 1 tablet (20 mg total) by mouth daily. 90 tablet 3   Vitamin D, Ergocalciferol, (DRISDOL) 1.25 MG (50000 UNIT) CAPS capsule TAKE 1 CAPSULE ONCE A WEEK 12 capsule 3   No current facility-administered medications for this visit.    Allergies: Avelox [moxifloxacin], Penicillins, Buprenorphine hcl, Morphine and codeine, Nitrofuran derivatives, Other, Procaine, Sulfa antibiotics, Sulfacetamide sodium, and Aspirin  Past Medical History:  Diagnosis Date   Arthritis    High cholesterol    Hypertension     Past Surgical History:  Procedure Laterality Date   ABDOMINAL HYSTERECTOMY     CHOLECYSTECTOMY N/A 01/21/2015   Procedure: LAPAROSCOPIC  CHOLECYSTECTOMY;  Surgeon: Jimmye Norman, MD;  Location: [facility];  Service: General;  Laterality: N/A;   COSMETIC SURGERY     FRACTURE SURGERY     INTRAMEDULLARY (IM) NAIL INTERTROCHANTERIC Left 11/27/2015   Procedure: INTRAMEDULLARY (IM) NAIL INTERTROCHANTRIC;  Surgeon: Eldred Manges, MD;  Location: [facility];  Service: Orthopedics;  Laterality: Left;   LAPAROSCOPY N/A 04/15/2015   Procedure: LAPAROSCOPY DIAGNOSTIC ileocalpexy ;  Surgeon: Karie Soda, MD;  Location: [facility];  Service: General;  Laterality: N/A;   SMALL INTESTINE SURGERY Right    TONSILLECTOMY     TUBAL LIGATION  08/13/1978    Family History  Problem Relation Age of Onset   Stroke Mother     Social History   Tobacco Use   Smoking status: Former    Packs/day: 1.00    Years: 40.00    Additional pack years: 0.00    Total pack years: 40.00    Types: Cigarettes, E-cigarettes    Quit date: 01/11/2013    Years since quitting: 9.9   Smokeless tobacco: Never  Substance Use Topics   Alcohol use: Not Currently    Comment: 1 beer/day    Subjective:   6 month follow up on hypertension; has been checking at home regularly and notes that systolic is consistently elevated; would like to discuss increasing dosage of Amlodipine; Denies any chest pain, shortness of breath, blurred vision or headache.    Objective:  Vitals:   01/08/23 0853 01/08/23 0929  BP: (!) 156/78 (!) 156/78  Pulse: 70   SpO2: 98%   Weight: 116 lb 6.4 oz (52.8 kg)   Height: 5\' 2"  (1.575 m)     General: Well developed, well nourished, in no acute distress  Skin : Warm and dry.  Head: Normocephalic and atraumatic  Lungs: Respirations unlabored; clear to auscultation bilaterally without wheeze, rales, rhonchi  CVS exam: normal rate and regular rhythm.  Neurologic: Alert and oriented; speech intact; face symmetrical; moves all extremities well; CNII-XII intact without focal deficit    Assessment:  1. Tobacco abuse   2. Aortic atherosclerosis (HCC)   3.  Essential (primary) hypertension     Plan:  Refer for lung cancer screening test;  Refer to cardiology for her yearly follow up; Uncontrolled- increase Amlodipine 2.5 mg to bid; she will call back with readings in the next week;   She defers mammogram or DEXA at this time;   Return in about 6 months (around 07/11/2023) for follow up.  Orders Placed This Encounter  Procedures   Ambulatory Referral Lung Cancer Screening [facility]    Referral Priority:   Routine    Referral Type:   Consultation    Referral Reason:   Specialty Services Required    Number of Visits Requested:   1   Ambulatory referral to Cardiology    Referral Priority:   Routine    Referral Type:   Consultation    Referral Reason:   Specialty Services Required    Referred to Provider:   Georgeanna Lea, MD    Number of Visits Requested:   1    Requested Prescriptions    No prescriptions requested or ordered in this encounter

## 2023-01-18 ENCOUNTER — Encounter: Payer: Self-pay | Admitting: Family

## 2023-01-28 ENCOUNTER — Encounter: Payer: Self-pay | Admitting: Family

## 2023-01-29 ENCOUNTER — Other Ambulatory Visit: Payer: Self-pay | Admitting: Family

## 2023-01-29 MED ORDER — AMLODIPINE BESYLATE 2.5 MG PO TABS: 2.5000 mg | ORAL_TABLET | Freq: Two times a day (BID) | ORAL | 3 refills | Status: DC

## 2023-03-22 DIAGNOSIS — H348392 Tributary (branch) retinal vein occlusion, unspecified eye, stable: Secondary | ICD-10-CM

## 2023-03-22 HISTORY — DX: Tributary (branch) retinal vein occlusion, unspecified eye, stable: H34.8392

## 2023-03-26 ENCOUNTER — Encounter: Payer: Self-pay | Admitting: Cardiology

## 2023-03-26 ENCOUNTER — Ambulatory Visit: Payer: Medicare Other | Attending: Cardiology | Admitting: Cardiology

## 2023-03-26 VITALS — BP 160/62 | HR 76 | Ht 62.0 in | Wt 115.0 lb

## 2023-03-26 DIAGNOSIS — I2510 Atherosclerotic heart disease of native coronary artery without angina pectoris: Secondary | ICD-10-CM | POA: Diagnosis present

## 2023-03-26 DIAGNOSIS — E7800 Pure hypercholesterolemia, unspecified: Secondary | ICD-10-CM | POA: Diagnosis not present

## 2023-03-26 DIAGNOSIS — I2584 Coronary atherosclerosis due to calcified coronary lesion: Secondary | ICD-10-CM | POA: Insufficient documentation

## 2023-03-26 DIAGNOSIS — I10 Essential (primary) hypertension: Secondary | ICD-10-CM | POA: Insufficient documentation

## 2023-03-26 NOTE — Patient Instructions (Addendum)
Medication Instructions:  Your physician recommends that you continue on your current medications as directed. Please refer to the Current Medication list given to you today.  *If you need a refill on your cardiac medications before your next appointment, please call your pharmacy*   Lab Work: None If you have labs (blood work) drawn today and your tests are completely normal, you will receive your results only by: [place] (if you have MyChart) OR A paper copy in the mail If you have any lab test that is abnormal or we need to change your treatment, we will call you to review the results.   Testing/Procedures: None   Follow-Up: At [facility], you and your health needs are our priority.  As part of our continuing mission to provide you with exceptional heart care, we have created designated Provider Care Teams.  These Care Teams include your primary Cardiologist (physician) and Advanced Practice Providers (APPs -  Physician Assistants and Nurse Practitioners) who all work together to provide you with the care you need, when you need it.  We recommend signing up for the patient portal called "MyChart".  Sign up information is provided on this After Visit Summary.  MyChart is used to connect with patients for Virtual Visits (Telemedicine).  Patients are able to view lab/test results, encounter notes, upcoming appointments, etc.  Non-urgent messages can be sent to your provider as well.   To learn more about what you can do with MyChart, go to [email or web address].    Your next appointment:   1 year(s)  Provider:   Jenne Campus, MD    Other Instructions

## 2023-03-26 NOTE — Progress Notes (Signed)
Cardiology Office Note:    Date:  03/26/2023   ID:  EMONI DERICK, DOB 1946-05-18, MRN [id number]  PCP:  Olive Bass, FNP  Cardiologist:  Gypsy Balsam, MD    Referring MD: Olive Bass,*   Chief Complaint  Patient presents with   Medication Management    History of Present Illness:    [name] is a 77 y.o. female with past medical history significant for history of smoking she quit smoking more than 10 years ago, essential hypertension, dyslipidemia, calcification of coronary artery.  After that she had a stress test which was negative.  She also got mild to moderate aortic insufficiency. Comes today to months for follow-up.  Overall history is doing well.  She denies have any chest pain tightness squeezing pressure mid chest today is her birthday and she is planning to go home and simply celebrate her birthday.  Past Medical History:  Diagnosis Date   Arthritis    High cholesterol    Hypertension     Past Surgical History:  Procedure Laterality Date   ABDOMINAL HYSTERECTOMY     CHOLECYSTECTOMY N/A 01/21/2015   Procedure: LAPAROSCOPIC CHOLECYSTECTOMY;  Surgeon: Jimmye Norman, MD;  Location: [facility];  Service: General;  Laterality: N/A;   COSMETIC SURGERY     FRACTURE SURGERY     INTRAMEDULLARY (IM) NAIL INTERTROCHANTERIC Left 11/27/2015   Procedure: INTRAMEDULLARY (IM) NAIL INTERTROCHANTRIC;  Surgeon: Eldred Manges, MD;  Location: [facility];  Service: Orthopedics;  Laterality: Left;   LAPAROSCOPY N/A 04/15/2015   Procedure: LAPAROSCOPY DIAGNOSTIC ileocalpexy ;  Surgeon: Karie Soda, MD;  Location: [facility];  Service: General;  Laterality: N/A;   SMALL INTESTINE SURGERY Right    TONSILLECTOMY     TUBAL LIGATION  08/13/1978    Current Medications: Current Meds  Medication Sig   amLODipine (NORVASC) 2.5 MG tablet Take 1 tablet (2.5 mg total) by mouth 2 (two) times daily. (Patient taking differently: Take 5 mg by mouth 2 (two) times daily.)    aspirin EC 81 MG tablet Take 81 mg by mouth daily. Swallow whole.   BRIMONIDINE TARTRATE OP Apply 2 drops to eye daily. R eye   BROMFENAC SODIUM OP Apply 4 drops to eye daily. R eye   fluticasone (FLONASE) 50 MCG/ACT nasal spray Place 2 sprays into both nostrils daily.   prednisoLONE acetate (PRED FORTE) 1 % ophthalmic suspension Place 1 drop into the right eye in the morning, at noon, and at bedtime.   rosuvastatin (CRESTOR) 20 MG tablet Take 1 tablet (20 mg total) by mouth daily.   Vitamin D, Ergocalciferol, (DRISDOL) 1.25 MG (50000 UNIT) CAPS capsule TAKE 1 CAPSULE ONCE A WEEK (Patient taking differently: Take 50,000 Units by mouth every 7 (seven) days.)   [DISCONTINUED] ketorolac (ACULAR) 0.5 % ophthalmic solution Place 1 drop into the right eye 3 (three) times daily.   [DISCONTINUED] pravastatin (PRAVACHOL) 80 MG tablet Take 1 tablet (80 mg total) by mouth daily.     Allergies:   Avelox [moxifloxacin], Penicillins, Buprenorphine hcl, Morphine and codeine, Nitrofuran derivatives, Other, Procaine, Sulfa antibiotics, Sulfacetamide sodium, and Aspirin   Social History   Socioeconomic History   Marital status: Married    Spouse name: Alinda Money   Number of children: 3   Years of education: Not on file   Highest education level: 11th grade  Occupational History   Not on file  Tobacco Use   Smoking status: Former    Current packs/day: 0.00  Average packs/day: 1 pack/day for 40.0 years (40.0 ttl pk-yrs)    Types: Cigarettes, E-cigarettes    Start date: 01/11/1973    Quit date: 01/11/2013    Years since quitting: 10.2   Smokeless tobacco: Never  Substance and Sexual Activity   Alcohol use: Not Currently    Comment: 1 beer/day   Drug use: Never   Sexual activity: Not Currently    Birth control/protection: Surgical  Other Topics Concern   Not on file  Social History Narrative   3 sons   10 grandchildren   3 great grandchildren   Recently got back from [place] seeing granddaughter.   Married  x 30 years.   Social Determinants of Health   Financial Resource Strain: Low Risk  (01/01/2023)   Overall Financial Resource Strain (CARDIA)    Difficulty of Paying Living Expenses: Not hard at all  Food Insecurity: No Food Insecurity (01/01/2023)   Hunger Vital Sign    Worried About Running Out of Food in the Last Year: Never true    Ran Out of Food in the Last Year: Never true  Transportation Needs: No Transportation Needs (01/01/2023)   PRAPARE - Administrator, Civil Service (Medical): No    Lack of Transportation (Non-Medical): No  Physical Activity: Insufficiently Active (01/01/2023)   Exercise Vital Sign    Days of Exercise per Week: 1 day    Minutes of Exercise per Session: 10 min  Stress: No Stress Concern Present (01/01/2023)   [employer] of Occupational Health - Occupational Stress Questionnaire    Feeling of Stress : Not at all  Social Connections: Moderately Integrated (01/01/2023)   Social Connection and Isolation Panel [NHANES]    Frequency of Communication with Friends and Family: Twice a week    Frequency of Social Gatherings with Friends and Family: Once a week    Attends Religious Services: 1 to 4 times per year    Active Member of [employer] or Organizations: No    Attends Engineer, structural: Not on file    Marital Status: Married     Family History: The patient's family history includes Stroke in her mother. ROS:   Please see the history of present illness.    All 14 point review of systems negative except as described per history of present illness  EKGs/Labs/Other Studies Reviewed:    EKG Interpretation Date/Time:  Tuesday March 26 2023 11:35:16 EDT Ventricular Rate:  76 PR Interval:  152 QRS Duration:  78 QT Interval:  410 QTC Calculation: 461 R Axis:   -33  Text Interpretation: Sinus rhythm with occasional Premature ventricular complexes Left axis deviation Nonspecific ST abnormality When compared with ECG of 17-Aug-2021  16:07, Premature ventricular complexes are now Present Confirmed by Gypsy Balsam [phone]) on 03/26/2023 11:43:58 AM    Recent Labs: 04/28/2022: ALT 18; BUN 15; Creatinine, Ser 0.75; Hemoglobin 14.0; Platelets 283; Potassium 3.8; Sodium 138  Recent Lipid Panel    Component Value Date/Time   CHOL 165 04/04/2022 0856   TRIG 58 04/04/2022 0856   HDL 81 04/04/2022 0856   CHOLHDL 2.0 04/04/2022 0856   CHOLHDL 3 01/02/2022 0930   VLDL 14.2 01/02/2022 0930   LDLCALC 72 04/04/2022 0856   LDLDIRECT 104 (H) 09/15/2021 1124    Physical Exam:    VS:  BP (!) 160/62 (BP Location: Left Arm, Patient Position: Sitting)   Pulse 76   Ht 5\' 2"  (1.575 m)   Wt 115 lb (52.2 kg)  SpO2 95%   BMI 21.03 kg/m     Wt Readings from Last 3 Encounters:  03/26/23 115 lb (52.2 kg)  01/08/23 116 lb 6.4 oz (52.8 kg)  12/12/22 114 lb (51.7 kg)     GEN:  Well nourished, well developed in no acute distress HEENT: Normal NECK: No JVD; No carotid bruits LYMPHATICS: No lymphadenopathy CARDIAC: RRR, no murmurs, no rubs, no gallops RESPIRATORY:  Clear to auscultation without rales, wheezing or rhonchi  ABDOMEN: Soft, non-tender, non-distended MUSCULOSKELETAL:  No edema; No deformity  SKIN: Warm and dry LOWER EXTREMITIES: no swelling NEUROLOGIC:  Alert and oriented x 3 PSYCHIATRIC:  Normal affect   ASSESSMENT:    1. Calcification of coronary artery   2. Essential (primary) hypertension   3. Hypercholesteremia    PLAN:    In order of problems listed above:  Calcification of the coronary arteries but negative stress test.  She is still asymptomatic continue antiplatelet therapy. Essential hypertension blood pressure elevated today but she said she always she comes to doctor got blood pressure elevation blood pressure at home is always normal. Hypercholesterolemia I did review K PN which show me her LDL 72 HDL 81 will continue present management which include high intensity statin form of Crestor  20.   Medication Adjustments/Labs and Tests Ordered: Current medicines are reviewed at length with the patient today.  Concerns regarding medicines are outlined above.  Orders Placed This Encounter  Procedures   EKG 12-Lead   Medication changes: No orders of the defined types were placed in this encounter.   Signed, Georgeanna Lea, MD, [facility] 03/26/2023 11:49 AM    [facility]

## 2023-06-17 ENCOUNTER — Other Ambulatory Visit: Payer: Self-pay | Admitting: Cardiology

## 2023-07-16 ENCOUNTER — Ambulatory Visit (INDEPENDENT_AMBULATORY_CARE_PROVIDER_SITE_OTHER): Payer: Medicare Other | Admitting: Family

## 2023-07-16 ENCOUNTER — Telehealth: Payer: Self-pay

## 2023-07-16 ENCOUNTER — Encounter: Payer: Self-pay | Admitting: Family

## 2023-07-16 VITALS — BP 140/80 | HR 71 | Temp 98.1°F | Ht 62.0 in | Wt 112.8 lb

## 2023-07-16 DIAGNOSIS — Z1159 Encounter for screening for other viral diseases: Secondary | ICD-10-CM

## 2023-07-16 DIAGNOSIS — I10 Essential (primary) hypertension: Secondary | ICD-10-CM

## 2023-07-16 DIAGNOSIS — R7303 Prediabetes: Secondary | ICD-10-CM

## 2023-07-16 DIAGNOSIS — E559 Vitamin D deficiency, unspecified: Secondary | ICD-10-CM

## 2023-07-16 DIAGNOSIS — Z803 Family history of malignant neoplasm of breast: Secondary | ICD-10-CM | POA: Diagnosis not present

## 2023-07-16 DIAGNOSIS — E7800 Pure hypercholesterolemia, unspecified: Secondary | ICD-10-CM | POA: Diagnosis not present

## 2023-07-16 DIAGNOSIS — R9389 Abnormal findings on diagnostic imaging of other specified body structures: Secondary | ICD-10-CM | POA: Diagnosis not present

## 2023-07-16 LAB — CBC WITH DIFFERENTIAL/PLATELET
Basophils Absolute: 0 10*3/uL (ref 0.0–0.1)
Basophils Relative: 0.3 % (ref 0.0–3.0)
Eosinophils Absolute: 0.4 10*3/uL (ref 0.0–0.7)
Eosinophils Relative: 5.6 % — ABNORMAL HIGH (ref 0.0–5.0)
HCT: 41.7 % (ref 36.0–46.0)
Hemoglobin: 13.9 g/dL (ref 12.0–15.0)
Lymphocytes Relative: 21.8 % (ref 12.0–46.0)
Lymphs Abs: 1.5 10*3/uL (ref 0.7–4.0)
MCHC: 33.2 g/dL (ref 30.0–36.0)
MCV: 93.6 fL (ref 78.0–100.0)
Monocytes Absolute: 0.6 10*3/uL (ref 0.1–1.0)
Monocytes Relative: 9.6 % (ref 3.0–12.0)
Neutro Abs: 4.2 10*3/uL (ref 1.4–7.7)
Neutrophils Relative %: 62.7 % (ref 43.0–77.0)
Platelets: 261 10*3/uL (ref 150.0–400.0)
RBC: 4.46 Mil/uL (ref 3.87–5.11)
RDW: 12.6 % (ref 11.5–15.5)
WBC: 6.7 10*3/uL (ref 4.0–10.5)

## 2023-07-16 LAB — COMPREHENSIVE METABOLIC PANEL
ALT: 11 U/L (ref 0–35)
AST: 18 U/L (ref 0–37)
Albumin: 4.5 g/dL (ref 3.5–5.2)
Alkaline Phosphatase: 84 U/L (ref 39–117)
BUN: 8 mg/dL (ref 6–23)
CO2: 34 meq/L — ABNORMAL HIGH (ref 19–32)
Calcium: 9.4 mg/dL (ref 8.4–10.5)
Chloride: 99 meq/L (ref 96–112)
Creatinine, Ser: 0.69 mg/dL (ref 0.40–1.20)
GFR: 84.96 mL/min (ref >60.00)
Glucose, Bld: 103 mg/dL — ABNORMAL HIGH (ref 70–99)
Potassium: 4.5 meq/L (ref 3.5–5.1)
Sodium: 139 meq/L (ref 135–145)
Total Bilirubin: 0.5 mg/dL (ref 0.2–1.2)
Total Protein: 6.8 g/dL (ref 6.0–8.3)

## 2023-07-16 LAB — LIPID PANEL
Cholesterol: 153 mg/dL (ref 0–200)
HDL: 55.9 mg/dL (ref >39.00)
LDL Cholesterol: 78 mg/dL (ref 0–99)
NonHDL: 97.03
Total CHOL/HDL Ratio: 3
Triglycerides: 95 mg/dL (ref 0.0–149.0)
VLDL: 19 mg/dL (ref 0.0–40.0)

## 2023-07-16 LAB — HEMOGLOBIN A1C: Hgb A1c MFr Bld: 6.1 % (ref 4.6–6.5)

## 2023-07-16 LAB — VITAMIN D 25 HYDROXY (VIT D DEFICIENCY, FRACTURES): VITD: 104.19 ng/mL (ref 30.00–100.00)

## 2023-07-16 NOTE — Progress Notes (Signed)
[name] is a 77 y.o. female with the following history as recorded in EpicCare:  Patient Active Problem List   Diagnosis Date Noted   Venous retinal branch occlusion 03/22/2023   Calcification of coronary artery 09/15/2021   Aortic atherosclerosis (HCC) 09/15/2021   Lisfranc dislocation, left, initial encounter 05/13/2020   Lateral malleolar fracture 05/13/2020   Congestion of both ears 05/16/2016   Hip fracture (HCC) 11/27/2015   Preop testing    Closed left hip fracture (HCC) 11/26/2015   Epigastric abdominal pain 04/14/2015   Essential (primary) hypertension 03/31/2015   Hepatic cyst 03/31/2015   Hypercholesteremia 03/31/2015   Kidney cysts 03/31/2015   Hyperglycemia 03/31/2015    Current Outpatient Medications  Medication Sig Dispense Refill   amLODipine (NORVASC) 2.5 MG tablet Take 1 tablet (2.5 mg total) by mouth 2 (two) times daily. (Patient taking differently: Take 5 mg by mouth 2 (two) times daily.) 180 tablet 3   aspirin EC 81 MG tablet Take 81 mg by mouth daily. Swallow whole.     BRIMONIDINE TARTRATE OP Apply 2 drops to eye daily. R eye     BROMFENAC SODIUM OP Apply 4 drops to eye daily. R eye     fluticasone (FLONASE) 50 MCG/ACT nasal spray Place 2 sprays into both nostrils daily. 16 g 6   prednisoLONE acetate (PRED FORTE) 1 % ophthalmic suspension Place 1 drop into the right eye in the morning, at noon, and at bedtime.     rosuvastatin (CRESTOR) 20 MG tablet Take 1 tablet (20 mg total) by mouth daily. 90 tablet 2   Vitamin D, Ergocalciferol, (DRISDOL) 1.25 MG (50000 UNIT) CAPS capsule TAKE 1 CAPSULE ONCE A WEEK (Patient taking differently: Take 50,000 Units by mouth every 7 (seven) days.) 12 capsule 3   No current facility-administered medications for this visit.    Allergies: Avelox [moxifloxacin], Penicillins, Buprenorphine hcl, Morphine and codeine, Nitrofuran derivatives, Other, Procaine, Sulfa antibiotics, Sulfacetamide sodium, and Aspirin  Past Medical  History:  Diagnosis Date   Arthritis    High cholesterol    Hypertension     Past Surgical History:  Procedure Laterality Date   ABDOMINAL HYSTERECTOMY     CHOLECYSTECTOMY N/A 01/21/2015   Procedure: LAPAROSCOPIC CHOLECYSTECTOMY;  Surgeon: Jimmye Norman, MD;  Location: [facility];  Service: General;  Laterality: N/A;   COSMETIC SURGERY     FRACTURE SURGERY     INTRAMEDULLARY (IM) NAIL INTERTROCHANTERIC Left 11/27/2015   Procedure: INTRAMEDULLARY (IM) NAIL INTERTROCHANTRIC;  Surgeon: Eldred Manges, MD;  Location: [facility];  Service: Orthopedics;  Laterality: Left;   LAPAROSCOPY N/A 04/15/2015   Procedure: LAPAROSCOPY DIAGNOSTIC ileocalpexy ;  Surgeon: Karie Soda, MD;  Location: [facility];  Service: General;  Laterality: N/A;   SMALL INTESTINE SURGERY Right    TONSILLECTOMY     TUBAL LIGATION  08/13/1978    Family History  Problem Relation Age of Onset   Stroke Mother     Social History   Tobacco Use   Smoking status: Former    Current packs/day: 0.00    Average packs/day: 1 pack/day for 40.0 years (40.0 ttl pk-yrs)    Types: Cigarettes, E-cigarettes    Start date: 01/11/1973    Quit date: 01/11/2013    Years since quitting: 10.5   Smokeless tobacco: Never  Substance Use Topics   Alcohol use: Not Currently    Comment: 1 beer/day    Subjective:    6 month follow up on hypertension; notes she has not been taking  her medication as prescribed; "I need to get back on track."  Notes that her sister was diagnosed with breast cancer recently- patient is still against having a mammogram; she does do self exams;   She defers any type of vaccine at this time;     Objective:  Vitals:   07/16/23 0858 07/16/23 0938  BP: (!) 144/82 (!) 140/80  Pulse: 71   Temp: 98.1 F (36.7 C)   TempSrc: Oral   SpO2: 96%   Weight: 112 lb 12.8 oz (51.2 kg)   Height: 5\' 2"  (1.575 m)     General: Well developed, well nourished, in no acute distress  Skin : Warm and dry.  Head: Normocephalic and  atraumatic  Eyes: Sclera and conjunctiva clear; pupils round and reactive to light; extraocular movements intact  Ears: External normal; canals clear; tympanic membranes normal  Oropharynx: Pink, supple. No suspicious lesions  Neck: Supple without thyromegaly, adenopathy  Lungs: Respirations unlabored; clear to auscultation bilaterally without wheeze, rales, rhonchi  CVS exam: normal rate and regular rhythm.  Neurologic: Alert and oriented; speech intact; face symmetrical; moves all extremities well; CNII-XII intact without focal deficit   Assessment:  1. Abnormal chest CT   2. FH: breast cancer   3. Need for hepatitis C screening test   4. Vitamin D deficiency   5. Pre-diabetes   6. Hypercholesteremia   7. Essential (primary) hypertension     Plan:  Patient again defers mammogram at this time; she is agreeable to updating chest CT; Will update yearly labs today; Encouraged to start taking blood pressure medication daily as prescribed; she will continue to monitor her blood pressure at home;  Follow up in 6 months, sooner prn.   No follow-ups on file.  Orders Placed This Encounter  Procedures   CT Chest Wo Contrast    Standing Status:   Future    Standing Expiration Date:   07/15/2024    Order Specific Question:   Preferred imaging location?    Answer:   [facility]   CBC with Differential/Platelet   Comp Met (CMET)   Lipid panel   Hemoglobin A1c   Vitamin D (25 hydroxy)   Hepatitis C Antibody    Requested Prescriptions    No prescriptions requested or ordered in this encounter

## 2023-07-16 NOTE — Telephone Encounter (Signed)
CRITICAL VALUE STICKER  CRITICAL VALUE: Vit D: 104.19  RECEIVER (on-site recipient of call): Wellington Hampshire, RMA  DATE & TIME NOTIFIED:  2:31 PM, 07/16/23  MESSENGER (representative from lab): [facility]  MD NOTIFIED: Ria Clock, NP- PCP  TIME OF NOTIFICATION: 2:31 PM, 07/16/23  RESPONSE:  Pending

## 2023-07-17 LAB — HEPATITIS C ANTIBODY: Hepatitis C Ab: NONREACTIVE

## 2023-07-22 ENCOUNTER — Ambulatory Visit (HOSPITAL_BASED_OUTPATIENT_CLINIC_OR_DEPARTMENT_OTHER)
Admission: RE | Admit: 2023-07-22 | Discharge: 2023-07-22 | Disposition: A | Discharge status: Home / Self Care | Payer: Medicare Other | Source: Ambulatory Visit | Attending: Family | Admitting: Family

## 2023-07-22 DIAGNOSIS — R9389 Abnormal findings on diagnostic imaging of other specified body structures: Secondary | ICD-10-CM | POA: Insufficient documentation

## 2023-08-05 ENCOUNTER — Other Ambulatory Visit: Payer: Self-pay | Admitting: Family

## 2023-08-05 DIAGNOSIS — R9389 Abnormal findings on diagnostic imaging of other specified body structures: Secondary | ICD-10-CM

## 2023-08-16 ENCOUNTER — Other Ambulatory Visit: Payer: Self-pay | Admitting: Family

## 2023-08-16 ENCOUNTER — Telehealth: Payer: Self-pay | Admitting: *Deleted

## 2023-08-16 DIAGNOSIS — E559 Vitamin D deficiency, unspecified: Secondary | ICD-10-CM

## 2023-08-16 NOTE — Telephone Encounter (Signed)
 Lab orders placed.

## 2023-08-19 ENCOUNTER — Other Ambulatory Visit: Payer: Medicare Other

## 2023-08-21 ENCOUNTER — Other Ambulatory Visit (INDEPENDENT_AMBULATORY_CARE_PROVIDER_SITE_OTHER): Payer: Medicare Other

## 2023-08-21 DIAGNOSIS — E559 Vitamin D deficiency, unspecified: Secondary | ICD-10-CM | POA: Diagnosis not present

## 2023-08-21 LAB — VITAMIN D 25 HYDROXY (VIT D DEFICIENCY, FRACTURES): VITD: 66.59 ng/mL (ref 30.00–100.00)

## 2023-09-16 ENCOUNTER — Telehealth: Payer: Medicare Other | Admitting: Physician Assistant

## 2023-09-16 DIAGNOSIS — J441 Chronic obstructive pulmonary disease with (acute) exacerbation: Secondary | ICD-10-CM | POA: Diagnosis not present

## 2023-09-16 MED ORDER — BENZONATATE 100 MG PO CAPS: 100.0000 mg | ORAL_CAPSULE | Freq: Three times a day (TID) | ORAL | 0 refills | Status: DC | PRN

## 2023-09-16 MED ORDER — PREDNISONE 20 MG PO TABS: 40.0000 mg | ORAL_TABLET | Freq: Every day | ORAL | 0 refills | Status: DC

## 2023-09-16 MED ORDER — AZITHROMYCIN 250 MG PO TABS: ORAL_TABLET | ORAL | 0 refills | Status: AC

## 2023-09-16 NOTE — Progress Notes (Signed)
E-Visit for Cough   We are sorry that you are not feeling well.  Here is how we plan to help!  Based on your presentation I believe you most likely have A cough due to bacteria.  When patients have a fever and a productive cough with a change in color or increased sputum production, we are concerned about bacterial bronchitis.  If left untreated it can progress to pneumonia.  If your symptoms do not improve with your treatment plan it is important that you contact your provider.   I have prescribed Azithromyin 250 mg: two tablets now and then one tablet daily for 4 additonal days    In addition you may use A non-prescription cough medication called Mucinex DM: take 2 tablets every 12 hours. and A prescription cough medication called Tessalon Perles 100mg . You may take 1-2 capsules every 8 hours as needed for your cough.  Prednisone 20mg  Take 2 tablets (40mg ) daily for 7 days.  From your responses in the eVisit questionnaire you describe inflammation in the upper respiratory tract which is causing a significant cough.  This is commonly called Bronchitis and has four common causes:   Allergies Viral Infections Acid Reflux Bacterial Infection Allergies, viruses and acid reflux are treated by controlling symptoms or eliminating the cause. An example might be a cough caused by taking certain blood pressure medications. You stop the cough by changing the medication. Another example might be a cough caused by acid reflux. Controlling the reflux helps control the cough.  USE OF BRONCHODILATOR ("RESCUE") INHALERS: There is a risk from using your bronchodilator too frequently.  The risk is that over-reliance on a medication which only relaxes the muscles surrounding the breathing tubes can reduce the effectiveness of medications prescribed to reduce swelling and congestion of the tubes themselves.  Although you feel brief relief from the bronchodilator inhaler, your asthma may actually be worsening with the  tubes becoming more swollen and filled with mucus.  This can delay other crucial treatments, such as oral steroid medications. If you need to use a bronchodilator inhaler daily, several times per day, you should discuss this with your provider.  There are probably better treatments that could be used to keep your asthma under control.     HOME CARE Only take medications as instructed by your medical team. Complete the entire course of an antibiotic. Drink plenty of fluids and get plenty of rest. Avoid close contacts especially the very young and the elderly Cover your mouth if you cough or cough into your sleeve. Always remember to wash your hands A steam or ultrasonic humidifier can help congestion.   GET HELP RIGHT AWAY IF: You develop worsening fever. You become short of breath You cough up blood. Your symptoms persist after you have completed your treatment plan MAKE SURE YOU  Understand these instructions. Will watch your condition. Will get help right away if you are not doing well or get worse.    Thank you for choosing an e-visit.  Your e-visit answers were reviewed by a board certified advanced clinical practitioner to complete your personal care plan. Depending upon the condition, your plan could have included both over the counter or prescription medications.  Please review your pharmacy choice. Make sure the pharmacy is open so you can pick up prescription now. If there is a problem, you may contact your provider through [employer] and have the prescription routed to another pharmacy.  Your safety is important to [place]. If you have drug  allergies check your prescription carefully.   For the next 24 hours you can use MyChart to ask questions about today's visit, request a non-urgent call back, or ask for a work or school excuse. You will get an email in the next two days asking about your experience. I hope that your e-visit has been valuable and will speed your  recovery.   I have spent 5 minutes in review of e-visit questionnaire, review and updating patient chart, medical decision making and response to patient.   Margaretann Loveless, PA-C

## 2023-10-06 ENCOUNTER — Telehealth: Payer: Medicare Other | Admitting: Family

## 2023-10-06 DIAGNOSIS — H109 Unspecified conjunctivitis: Secondary | ICD-10-CM | POA: Diagnosis not present

## 2023-10-06 MED ORDER — POLYMYXIN B-TRIMETHOPRIM 10000-0.1 UNIT/ML-% OP SOLN: 1.0000 [drp] | Freq: Four times a day (QID) | OPHTHALMIC | 0 refills | Status: DC

## 2023-10-06 NOTE — Progress Notes (Signed)
E-Visit for Pink Eye   We are sorry that you are not feeling well.  Here is how we plan to help!  Based on what you have shared with me it looks like you have conjunctivitis.  Conjunctivitis is a common inflammatory or infectious condition of the eye that is often referred to as "pink eye".  In most cases it is contagious (viral or bacterial). However, not all conjunctivitis requires antibiotics (ex. Allergic).  We have made appropriate suggestions for you based upon your presentation.  I have prescribed Polytrim Ophthalmic drops 1-2 drops 4 times a day times 5 days  Pink eye can be highly contagious.  It is typically spread through direct contact with secretions, or contaminated objects or surfaces that one may have touched.  Strict handwashing is suggested with soap and water is urged.  If not available, use alcohol based had sanitizer.  Avoid unnecessary touching of the eye.  If you wear contact lenses, you will need to refrain from wearing them until you see no white discharge from the eye for at least 24 hours after being on medication.  You should see symptom improvement in 1-2 days after starting the medication regimen.  Call us if symptoms are not improved in 1-2 days.  Home Care: Wash your hands often! Do not wear your contacts until you complete your treatment plan. Avoid sharing towels, bed linen, personal items with a person who has pink eye. See attention for anyone in your home with similar symptoms.  Get Help Right Away If: Your symptoms do not improve. You develop blurred or loss of vision. Your symptoms worsen (increased discharge, pain or redness)   Thank you for choosing an e-visit.  Your e-visit answers were reviewed by a board certified advanced clinical practitioner to complete your personal care plan. Depending upon the condition, your plan could have included both over the counter or prescription medications.  Please review your pharmacy choice. Make sure the  pharmacy is open so you can pick up prescription now. If there is a problem, you may contact your provider through MyChart messaging and have the prescription routed to another pharmacy.  Your safety is important to us. If you have drug allergies check your prescription carefully.   For the next 24 hours you can use MyChart to ask questions about today's visit, request a non-urgent call back, or ask for a work or school excuse. You will get an email in the next two days asking about your experience. I hope that your e-visit has been valuable and will speed your recovery.   Approximately 5 minutes was spent documenting and reviewing patient's chart.   

## 2023-10-11 ENCOUNTER — Ambulatory Visit (INDEPENDENT_AMBULATORY_CARE_PROVIDER_SITE_OTHER): Payer: Medicare Other | Admitting: Family

## 2023-10-11 ENCOUNTER — Encounter: Payer: Self-pay | Admitting: Family

## 2023-10-11 VITALS — BP 144/80 | HR 75 | Temp 97.9°F | Ht 62.0 in | Wt 111.0 lb

## 2023-10-11 DIAGNOSIS — J019 Acute sinusitis, unspecified: Secondary | ICD-10-CM | POA: Diagnosis not present

## 2023-10-11 MED ORDER — PREDNISONE 20 MG PO TABS: 20.0000 mg | ORAL_TABLET | Freq: Every day | ORAL | 0 refills | Status: DC

## 2023-10-11 MED ORDER — DOXYCYCLINE HYCLATE 100 MG PO TABS: 100.0000 mg | ORAL_TABLET | Freq: Two times a day (BID) | ORAL | 0 refills | Status: DC

## 2023-10-11 NOTE — Progress Notes (Signed)
 [name] is a 78 y.o. 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 Swallow whole.     benzonatate (TESSALON) 100 MG capsule Take 1 capsule (100 mg total) by mouth 3 (three) times daily as needed. 30 capsule 0   BRIMONIDINE TARTRATE OP Apply 2 drops to eye daily. R eye     BROMFENAC SODIUM OP Apply 4 drops to eye daily. R eye     doxycycline (VIBRA-TABS) 100 MG tablet Take 1 tablet (100 mg total) by mouth 2 (two) times daily. 14 tablet 0   fluticasone (FLONASE) 50 MCG/ACT nasal spray SPRAY 2 SPRAYS INTO EACH NOSTRIL EVERY DAY 16 mL 6   prednisoLONE acetate (PRED FORTE) 1 % ophthalmic suspension Place 1 drop into the right eye in the morning, at noon, and at bedtime.     predniSONE (DELTASONE) 20 MG tablet Take 2 tablets (40 mg total) by mouth daily with breakfast. 14 tablet 0   predniSONE (DELTASONE) 20 MG tablet Take 1 tablet (20 mg total) by mouth daily with breakfast. 5 tablet 0   rosuvastatin (CRESTOR) 20 MG tablet Take 1 tablet (20 mg total) by mouth daily. 90 tablet 2   trimethoprim-polymyxin b (POLYTRIM) ophthalmic solution Place 1 drop into the right eye every 6 (six) hours. 10 mL 0   amLODipine (NORVASC) 2.5 MG tablet Take 1 tablet (2.5  mg total) by mouth 2 (two) times daily. (Patient taking differently: Take 5 mg by mouth 2 (two) times daily.) 180 tablet 3   Vitamin D, Ergocalciferol, (DRISDOL) 1.25 MG (50000 UNIT) CAPS capsule TAKE 1 CAPSULE ONCE A WEEK (Patient taking differently: Take 50,000 Units by mouth every 7 (seven) days.) 12 capsule 3   No current facility-administered medications for this visit.    Allergies: Avelox [moxifloxacin], Penicillins, Buprenorphine hcl, Morphine and codeine, Nitrofuran derivatives, Other, Procaine, Sulfa antibiotics, Sulfacetamide sodium, and Aspirin  Past Medical History:  Diagnosis Date   Arthritis    High cholesterol    Hypertension     Past Surgical History:  Procedure Laterality Date   ABDOMINAL HYSTERECTOMY     CHOLECYSTECTOMY N/A 01/21/2015   Procedure: LAPAROSCOPIC CHOLECYSTECTOMY;  Surgeon: Jimmye Norman, MD;  Location: [facility] OR;  Service: General;  Laterality: N/A;   COSMETIC SURGERY     FRACTURE SURGERY     INTRAMEDULLARY (IM) NAIL INTERTROCHANTERIC Left 11/27/2015   Procedure: INTRAMEDULLARY (IM) NAIL INTERTROCHANTRIC;  Surgeon: Eldred Manges, MD;  Location: [facility];  Service: Orthopedics;  Laterality: Left;   LAPAROSCOPY N/A 04/15/2015   Procedure: LAPAROSCOPY DIAGNOSTIC ileocalpexy ;  Surgeon: Karie Soda, MD;  Location: [facility];  Service: General;  Laterality: N/A;   SMALL INTESTINE SURGERY Right    TONSILLECTOMY     TUBAL LIGATION  08/13/1978    Family  History  Problem Relation Age of Onset   Stroke Mother     Social History   Tobacco Use   Smoking status: Former    Current packs/day: 0.00    Average packs/day: 1 pack/day for 40.0 years (40.0 ttl pk-yrs)    Types: Cigarettes, E-cigarettes    Start date: 01/11/1973    Quit date: 01/11/2013    Years since quitting: 10.7   Smokeless tobacco: Never  Substance Use Topics   Alcohol use: Not Currently    Comment: 1 beer/day    Subjective:   Early February was treated with e-visit for COPD exacerbation with Z-pak/  Prednisone; then flew to [place] and upon return came home with pink eye from great grandson; 2nd e-visit on 2/23 and responded to eye drops; concerned however persisting ear pressure/ congestion x 1 week; no fever, no chest pain; no OTC medications;     Objective:  Vitals:   10/11/23 0911  BP: (!) 144/80  Pulse: 75  Temp: 97.9 F (36.6 C)  TempSrc: Oral  SpO2: 97%  Weight: 111 lb (50.3 kg)  Height: 5\' 2"  (1.575 m)    General: Well developed, well nourished, in no acute distress  Skin : Warm and dry.  Head: Normocephalic and atraumatic  Eyes: Sclera and conjunctiva clear; pupils round and reactive to light; extraocular movements intact  Ears: External normal; cerumen impaction R ear on initial exam- after lavage, canals clear; tympanic membranes congested bilaterally/ mild erythema R TM Oropharynx: Pink, supple. No suspicious lesions  Neck: Supple without thyromegaly, adenopathy  Lungs: Respirations unlabored; clear to auscultation bilaterally without wheeze, rales, rhonchi  CVS exam: normal rate and regular rhythm.  Abdomen: Soft; nontender; nondistended; normoactive bowel sounds; no masses or hepatosplenomegaly  Musculoskeletal: No deformities; no active joint inflammation  Extremities: No edema, cyanosis, clubbing  Vessels: Symmetric bilaterally  Neurologic: Alert and oriented; speech intact; face symmetrical; moves all extremities well; CNII-XII intact without focal deficit   Assessment:  1. Acute sinusitis, recurrence not specified, unspecified location     Plan:  Rx for Doxycycline 100 mg bid x 7 days; Rx for Prednisone 20 mg every day x 5 days;  Ear lavage was completed on right ear with no complications;   No follow-ups on file.  No orders of the defined types were placed in this encounter.   Requested Prescriptions   Signed Prescriptions Disp Refills   doxycycline (VIBRA-TABS) 100 MG tablet 14 tablet 0    Sig: Take 1 tablet (100 mg total) by mouth 2 (two)  times daily.   predniSONE (DELTASONE) 20 MG tablet 5 tablet 0    Sig: Take 1 tablet (20 mg total) by mouth daily with breakfast.

## 2023-11-27 ENCOUNTER — Telehealth: Payer: Self-pay | Admitting: Family

## 2023-11-27 NOTE — Telephone Encounter (Signed)
 Pt would like fluticasone transferred to [facility] at the address below. This RN [facility]. [facility] advised this RN that the provider would have to send the prescription to [facility].  Copied from CRM [id number]. Topic: Clinical - Prescription Issue >> Nov 27, 2023  9:13 AM Jenice Mitts wrote: Reason for CRM: Patient is calling in because she wants her flonase transferred to [facility] - [place], [place] - [address] [place] [address] Phone: [phone] Fax: [phone] Hours: Not open 24 hours  Because it it [facility]. Patient is wonder is she can get [facility] so she can afford it

## 2023-11-28 IMAGING — CT CT ANGIO CHEST
3 of 9 series · 18 of 36 positions shown · IV contrast (Omnipaque)
Comparison: Chest XR, 11/27/2015.  CT AP, 04/14/2015.

CLINICAL DATA: Pulmonary embolism (PE) suspected

EXAM:
CT ANGIOGRAPHY CHEST WITH CONTRAST
TECHNIQUE: Multidetector CT imaging of the chest was performed using the
standard protocol during bolus administration of intravenous
contrast. Multiplanar CT image reconstructions and MIPs were
obtained to evaluate the vascular anatomy.
CONTRAST:  75mL OMNIPAQUE IOHEXOL 350 MG/ML SOLN

[Series 5: pe lung · axial · 0.83mm/px · z∈[-221,-143]mm · 2 of 78 slices shown]
[im 26/78  mediastinal]
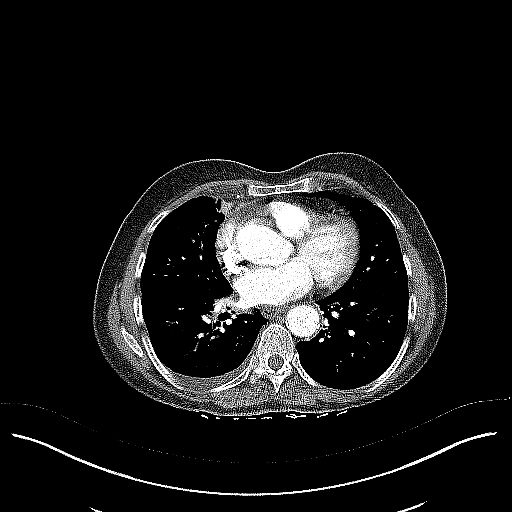
[im 52/78  mediastinal]
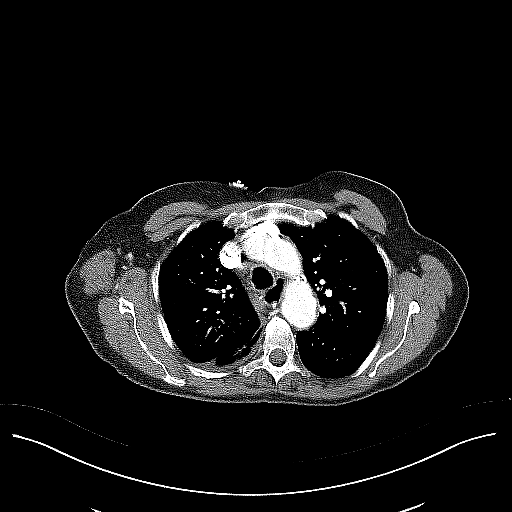

[Series 6: pe thins · axial · 0.66mm/px · z∈[-306,-82]mm · 15 of 258 slices shown]
[im 17/258  lung]
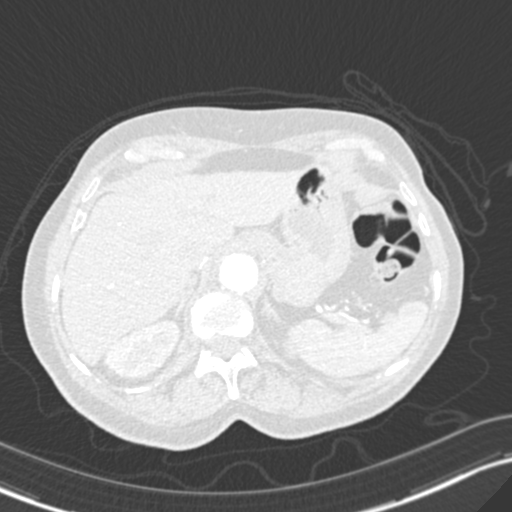
[im 33/258  mediastinal]
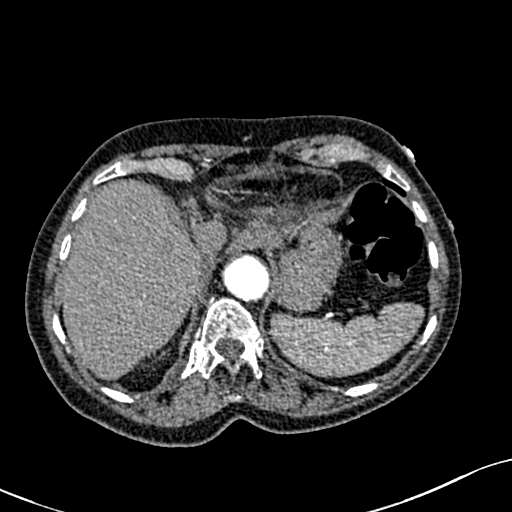
[im 49/258  lung]
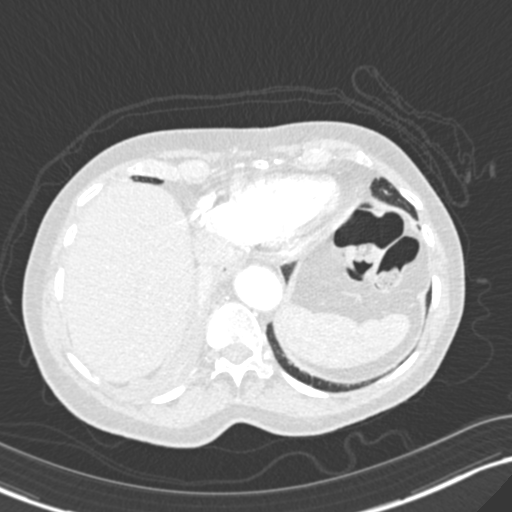
[im 65/258  mediastinal]
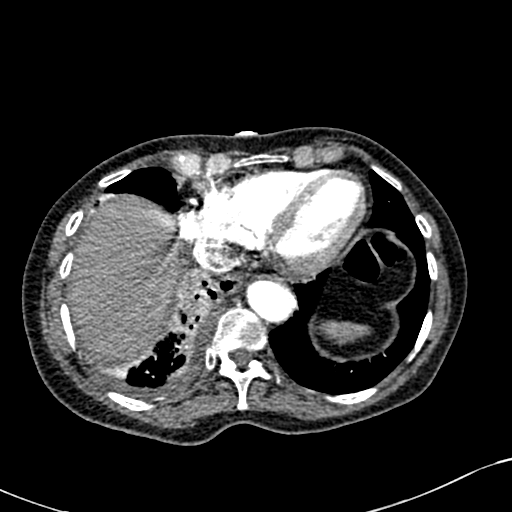
[im 81/258  lung]
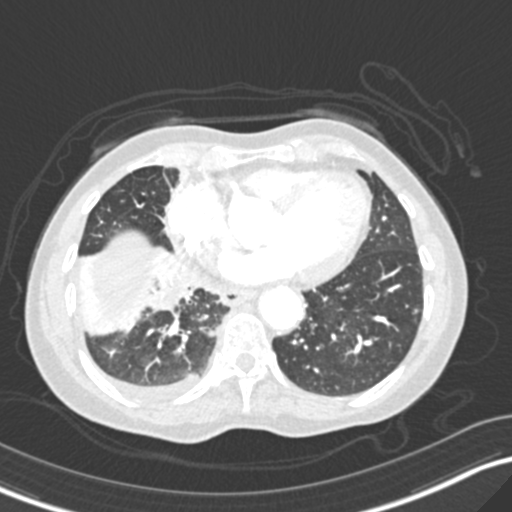
[im 97/258  mediastinal]
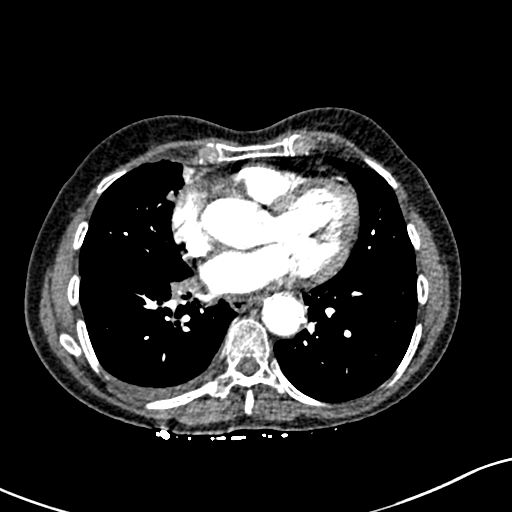
[im 113/258  lung]
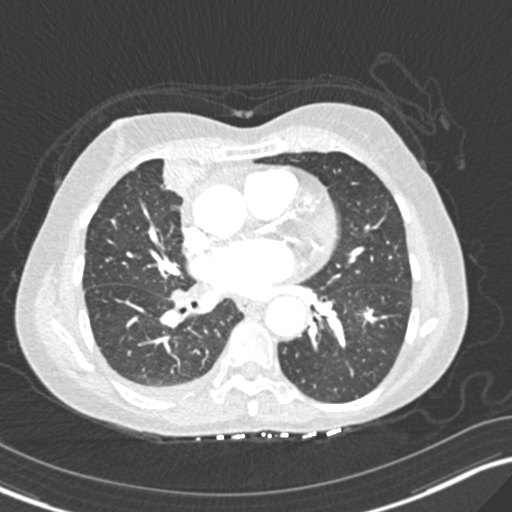
[im 129/258  mediastinal]
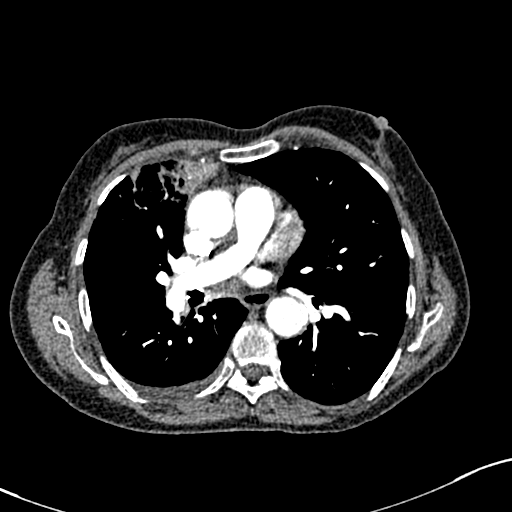
[im 145/258  lung]
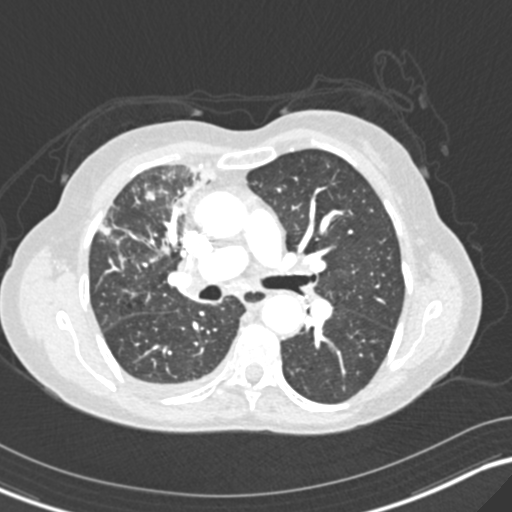
[im 161/258  mediastinal]
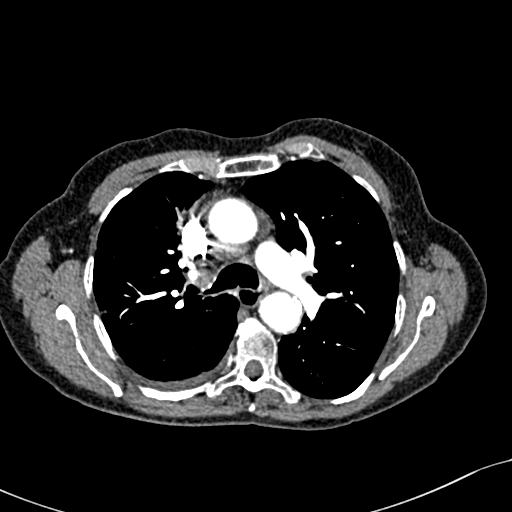
[im 177/258  lung]
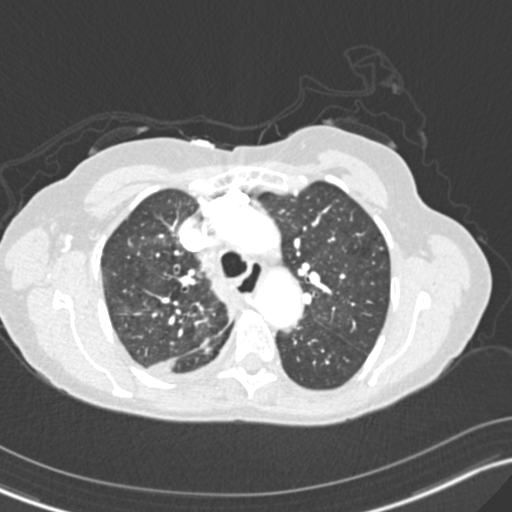
[im 193/258  mediastinal]
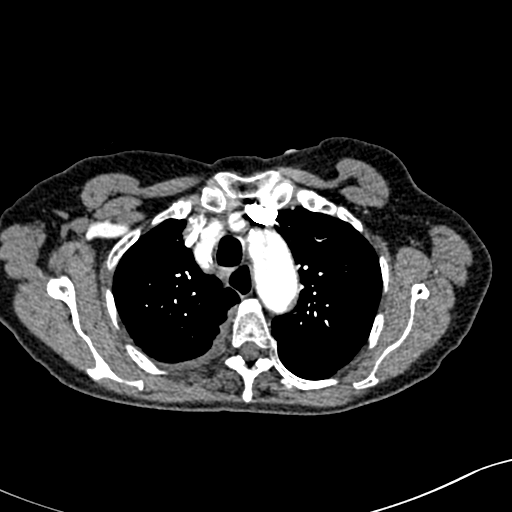
[im 209/258  lung]
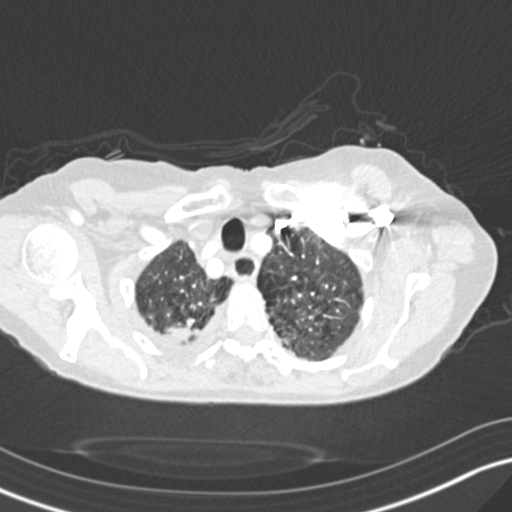
[im 225/258  mediastinal]
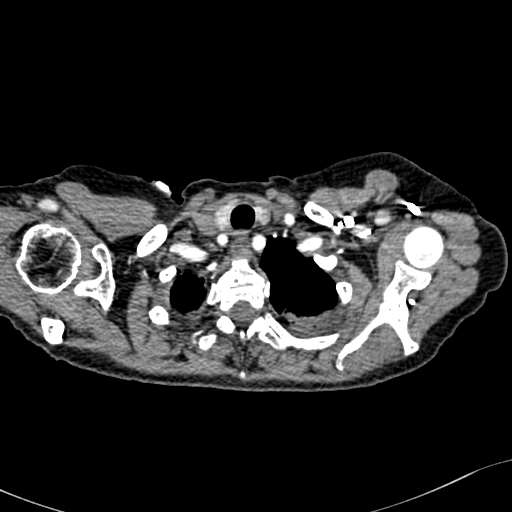
[im 241/258  lung]
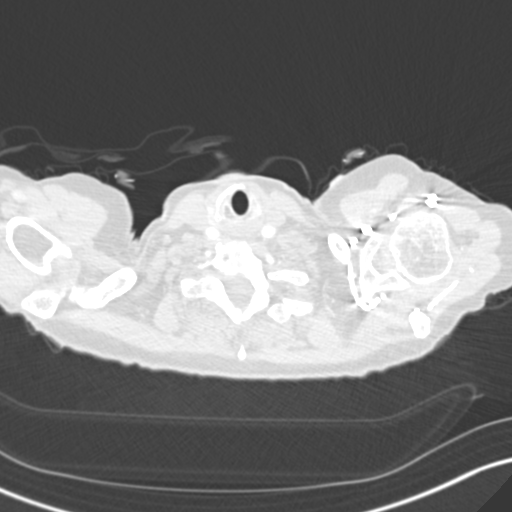

[Series 7: pe coronal mpr · coronal · 0.59mm/px · 1 of 144 slices shown]
[im 72/144  mediastinal]
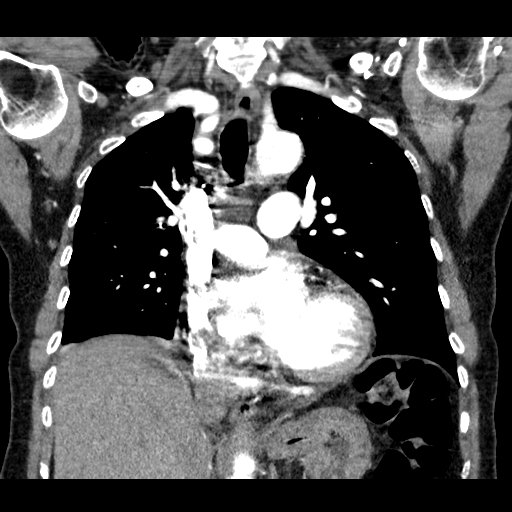

[18 of 36 positions shown; findings below may reference images not displayed]

FINDINGS: Suboptimal evaluation, secondary to motion degradation.

Cardiovascular: Satisfactory opacification of the pulmonary arteries
to the segmental level. No evidence of segmental or larger pulmonary
embolus.

Normal heart size. No pericardial effusion.

Mediastinum/Nodes: No enlarged mediastinal, hilar, or axillary lymph
nodes. Thyroid gland, trachea, and esophagus demonstrate no
significant findings.

Lungs/Pleura:

Centrilobular emphysematous lung changes, greatest at the apices.
Biapical capping. Posterior RIGHT apical band-like scarring.
Anterior RIGHT upper lobe consolidation, and scattered
bronchovascular opacities. Small volume RIGHT pleural effusion. No
pneumothorax.

Upper Abdomen: No acute abnormality.  Small accessory spleen.

Musculoskeletal: No chest wall abnormality. Mildly exaggerated
thoracic kyphosis. No acute osseous findings.

Review of the MIP images confirms the above findings.
IMPRESSION: 1. No segmental or larger pulmonary embolus.
2. Anterior RIGHT upper lobe consolidation and scattered
bronchovascular opacities. Findings are consistent with pneumonia,
with small effusion.
An underlying mass can appear similar, recommend follow-up imaging
with contrasted CT chest after treatment completion to ensure
resolution.
3. Biapical centrilobular emphysematous lung change with posterior
RIGHT bandlike scarring. Attention on follow-up.

## 2023-12-02 ENCOUNTER — Other Ambulatory Visit: Payer: Self-pay | Admitting: Family

## 2023-12-02 ENCOUNTER — Telehealth: Payer: Self-pay | Admitting: Family

## 2023-12-02 MED ORDER — FLUTICASONE PROPIONATE 50 MCG/ACT NA SUSP: 2.0000 | Freq: Every day | NASAL | 3 refills | Status: AC

## 2023-12-02 NOTE — Telephone Encounter (Signed)
 Pt called in requesting her fluticase be sent through [facility]. Pt has another note from the 16th regarding this matter. Please call pt back and inform of next steps.

## 2023-12-19 ENCOUNTER — Ambulatory Visit

## 2023-12-19 DIAGNOSIS — Z0000 Encounter for general adult medical examination without abnormal findings: Secondary | ICD-10-CM | POA: Diagnosis not present

## 2023-12-19 NOTE — Patient Instructions (Signed)
 [name] , Thank you for taking time to come for your Medicare Wellness Visit. I appreciate your ongoing commitment to your health goals. Please review the following plan we discussed and let me know if I can assist you in the future.   Referrals/Orders/Follow-Ups/Clinician Recommendations: none  This is a list of the screening recommended for you and due dates:  Health Maintenance  Topic Date Due   DEXA scan (bone density measurement)  01/08/2024*   Screening for Lung Cancer  07/21/2024   Cologuard (Stool DNA test)  08/16/2024   Medicare Annual Wellness Visit  12/18/2024   Pneumonia Vaccine  Completed   Hepatitis C Screening  Completed   HPV Vaccine  Aged Out   Meningitis B Vaccine  Aged Out   DTaP/Tdap/Td vaccine  Discontinued   Flu Shot  Discontinued   COVID-19 Vaccine  Discontinued   Zoster (Shingles) Vaccine  Discontinued  *Topic was postponed. The date shown is not the original due date.    Advanced directives: (Copy Requested) Please bring a copy of your health care power of attorney and living will to the office to be added to your chart at your convenience. You can mail to [facility] [address] [place], [place] [address] or email to [email or web address]  Next Medicare Annual Wellness Visit scheduled for next year: Yes  Have you seen your provider in the last 6 months (3 months if uncontrolled diabetes)? Yes, appointment 01/14/2024  insert Preventive Care attachment Insert FALL PREVENTION attachment if needed

## 2023-12-19 NOTE — Progress Notes (Signed)
 Subjective:   [name] is a 78 y.o. who presents for a Medicare Wellness preventive visit.  Visit Complete: Virtual I connected with  Emberly H Gieger on 12/19/23 by a audio enabled telemedicine application and verified that I am speaking with the correct person using two identifiers.  Patient Location: Home  Provider Location: Home Office  I discussed the limitations of evaluation and management by telemedicine. The patient expressed understanding and agreed to proceed.  Vital Signs: Because this visit was a virtual/telehealth visit, some criteria may be missing or patient reported. Any vitals not documented were not able to be obtained and vitals that have been documented are patient reported.  VideoError- Librarian, academic were attempted between this provider and patient, however failed, due to patient having technical difficulties OR patient did not have access to video capability.  We continued and completed visit with audio only.   Persons Participating in Visit: Patient.  AWV Questionnaire: No: Patient Medicare AWV questionnaire was not completed prior to this visit.  Cardiac Risk Factors include: advanced age (>69men, >28 women);hypertension     Objective:    Today's Vitals   There is no height or weight on file to calculate BMI.     12/19/2023    9:36 AM 12/12/2022    9:42 AM 11/23/2021   11:00 AM 08/17/2021    4:08 PM 05/06/2020    5:42 PM 11/27/2015    9:40 AM 11/26/2015    5:00 PM  Advanced Directives  Does Patient Have a Medical Advance Directive? Yes Yes Yes No No No No  Type of Estate agent of [place];Living will Living will Healthcare Power of [place];Living will      Copy of Healthcare Power of Attorney in Chart? No - copy requested  No - copy requested      Would patient like information on creating a medical advance directive?    No - Patient declined  No - patient declined information No - patient  declined information    Current Medications (verified) Outpatient Encounter Medications as of 12/19/2023  Medication Sig   amLODipine  (NORVASC ) 2.5 MG tablet Take 1 tablet (2.5 mg total) by mouth 2 (two) times daily. (Patient taking differently: Take 5 mg by mouth 2 (two) times daily.)   aspirin  EC 81 MG tablet Take 81 mg by mouth daily. Swallow whole.   BRIMONIDINE TARTRATE OP Apply 2 drops to eye daily. R eye   BROMFENAC SODIUM OP Apply 4 drops to eye daily. R eye   Difluprednate 0.05 % EMUL Place 1 drop into the right eye 3 (three) times daily.   fluticasone  (FLONASE ) 50 MCG/ACT nasal spray Place 2 sprays into both nostrils daily.   rosuvastatin  (CRESTOR ) 20 MG tablet Take 1 tablet (20 mg total) by mouth daily.   benzonatate  (TESSALON ) 100 MG capsule Take 1 capsule (100 mg total) by mouth 3 (three) times daily as needed. (Patient not taking: Reported on 12/19/2023)   doxycycline  (VIBRA -TABS) 100 MG tablet Take 1 tablet (100 mg total) by mouth 2 (two) times daily. (Patient not taking: Reported on 12/19/2023)   prednisoLONE acetate (PRED FORTE) 1 % ophthalmic suspension Place 1 drop into the right eye in the morning, at noon, and at bedtime. (Patient not taking: Reported on 12/19/2023)   predniSONE  (DELTASONE ) 20 MG tablet Take 2 tablets (40 mg total) by mouth daily with breakfast. (Patient not taking: Reported on 12/19/2023)   predniSONE  (DELTASONE ) 20 MG tablet Take 1 tablet (20  mg total) by mouth daily with breakfast. (Patient not taking: Reported on 12/19/2023)   trimethoprim -polymyxin b  (POLYTRIM ) ophthalmic solution Place 1 drop into the right eye every 6 (six) hours. (Patient not taking: Reported on 12/19/2023)   [DISCONTINUED] pravastatin  (PRAVACHOL ) 80 MG tablet Take 1 tablet (80 mg total) by mouth daily.   No facility-administered encounter medications on file as of 12/19/2023.    Allergies (verified) Avelox [moxifloxacin], Penicillins, Buprenorphine hcl, Morphine and codeine, Nitrofuran  derivatives, Other, Procaine, Sulfa antibiotics, Sulfacetamide sodium, and Aspirin    History: Past Medical History:  Diagnosis Date   Arthritis    High cholesterol    Hypertension    Past Surgical History:  Procedure Laterality Date   ABDOMINAL HYSTERECTOMY     CHOLECYSTECTOMY N/A 01/21/2015   Procedure: LAPAROSCOPIC CHOLECYSTECTOMY;  Surgeon: Jerryl Morin, MD;  Location: [facility];  Service: General;  Laterality: N/A;   COSMETIC SURGERY     FRACTURE SURGERY     INTRAMEDULLARY (IM) NAIL INTERTROCHANTERIC Left 11/27/2015   Procedure: INTRAMEDULLARY (IM) NAIL INTERTROCHANTRIC;  Surgeon: Adah Acron, MD;  Location: [facility];  Service: Orthopedics;  Laterality: Left;   LAPAROSCOPY N/A 04/15/2015   Procedure: LAPAROSCOPY DIAGNOSTIC ileocalpexy ;  Surgeon: Candyce Champagne, MD;  Location: [facility];  Service: General;  Laterality: N/A;   SMALL INTESTINE SURGERY Right    TONSILLECTOMY     TUBAL LIGATION  08/13/1978   Family History  Problem Relation Age of Onset   Stroke Mother    Social History   Socioeconomic History   Marital status: Married    Spouse name: Baker Bon   Number of children: 3   Years of education: Not on file   Highest education level: 11th grade  Occupational History   Not on file  Tobacco Use   Smoking status: Former    Current packs/day: 0.00    Average packs/day: 1 pack/day for 40.0 years (40.0 ttl pk-yrs)    Types: Cigarettes, E-cigarettes    Start date: 01/11/1973    Quit date: 01/11/2013    Years since quitting: 10.9   Smokeless tobacco: Never  Vaping Use   Vaping status: Never Used  Substance and Sexual Activity   Alcohol use: Yes    Alcohol/week: 2.0 standard drinks of alcohol    Types: 2 Cans of beer per week    Comment: 1 beer/day   Drug use: Never   Sexual activity: Not Currently    Birth control/protection: Surgical  Other Topics Concern   Not on file  Social History Narrative   3 sons   10 grandchildren   3 great grandchildren   Recently got back from  [place] seeing granddaughter.   Married x 30 years.   Social Drivers of Corporate investment banker Strain: Low Risk  (12/19/2023)   Overall Financial Resource Strain (CARDIA)    Difficulty of Paying Living Expenses: Not hard at all  Food Insecurity: No Food Insecurity (12/19/2023)   Hunger Vital Sign    Worried About Running Out of Food in the Last Year: Never true    Ran Out of Food in the Last Year: Never true  Transportation Needs: No Transportation Needs (12/19/2023)   PRAPARE - Administrator, Civil Service (Medical): No    Lack of Transportation (Non-Medical): No  Physical Activity: Inactive (12/19/2023)   Exercise Vital Sign    Days of Exercise per Week: 0 days    Minutes of Exercise per Session: 0 min  Stress: No Stress Concern Present (  12/19/2023)   [place] [employer] - Occupational Stress Questionnaire    Feeling of Stress : Not at all  Social Connections: Moderately Isolated (12/19/2023)   Social Connection and Isolation Panel [NHANES]    Frequency of Communication with Friends and Family: More than three times a week    Frequency of Social Gatherings with Friends and Family: Once a week    Attends Religious Services: Never    Database administrator or Organizations: No    Attends Engineer, structural: Never    Marital Status: Married    Tobacco Counseling Counseling given: Not Answered    Clinical Intake:  Pre-visit preparation completed: Yes  Pain : No/denies pain     Nutritional Risks: None Diabetes: No  Lab Results  Component Value Date   HGBA1C 6.1 07/16/2023   HGBA1C 5.8 01/02/2022     How often do you need to have someone help you when you read instructions, pamphlets, or other written materials from your doctor or pharmacy?: 1 - Never  Interpreter Needed?: No  Information entered by :: NAllen LPN   Activities of Daily Living     12/19/2023    9:27 AM  In your present state of health, do you have any  difficulty performing the following activities:  Hearing? 0  Vision? 1  Comment trouble with right eye  Difficulty concentrating or making decisions? 0  Walking or climbing stairs? 1  Comment rod in a leg  Dressing or bathing? 0  Doing errands, shopping? 0  Preparing Food and eating ? N  Using the Toilet? N  In the past six months, have you accidently leaked urine? N  Do you have problems with loss of bowel control? N  Managing your Medications? N  Managing your Finances? N  Housekeeping or managing your Housekeeping? N    Patient Care Team: Adra Alanis, FNP as PCP - General (Internal Medicine)  Indicate any recent Medical Services you may have received from other than [facility] providers in the past year (date may be approximate).     Assessment:    This is a routine wellness examination for [name].  Hearing/Vision screen Hearing Screening - Comments:: Denies hearing issues  Vision Screening - Comments:: Regular eye exams, [facility]   Goals Addressed             This Visit's Progress    Patient Stated       12/19/2023, denies goals       Depression Screen     12/19/2023    9:37 AM 01/08/2023    9:03 AM 12/12/2022    9:44 AM 07/10/2022    9:52 AM 01/02/2022    9:15 AM 11/23/2021   10:57 AM 08/31/2021    9:24 AM  PHQ 2/9 Scores  PHQ - 2 Score 0 0 0 0 0 0 0  PHQ- 9 Score 1          Fall Risk     12/19/2023    9:37 AM 01/08/2023    9:03 AM 12/11/2022    4:47 PM 07/10/2022    9:52 AM 01/02/2022    9:15 AM  Fall Risk   Falls in the past year? 0 0 0 0 0  Number falls in past yr: 0 0 0 0 0  Injury with Fall? 0 0 0 0 0  Risk for fall due to : Medication side effect No Fall Risks No Fall Risks History of fall(s)  Follow up Falls prevention discussed;Falls evaluation completed Falls evaluation completed Falls evaluation completed Falls evaluation completed     MEDICARE RISK AT HOME:  Medicare Risk at Home Any stairs in or around the home?:  Yes If so, are there any without handrails?: Yes Home free of loose throw rugs in walkways, pet beds, electrical cords, etc?: Yes Adequate lighting in your home to reduce risk of falls?: Yes Life alert?: No Use of a cane, walker or w/c?: No Grab bars in the bathroom?: No Shower chair or bench in shower?: No Elevated toilet seat or a handicapped toilet?: Yes  TIMED UP AND GO:  Was the test performed?  No  Cognitive Function: 6CIT completed        12/19/2023    9:38 AM 12/12/2022    9:48 AM 11/23/2021   11:03 AM  6CIT Screen  What Year? 0 points 0 points 0 points  What month? 0 points 0 points 0 points  What time? 0 points 0 points 0 points  Count back from 20 0 points 0 points 0 points  Months in reverse 0 points 0 points 0 points  Repeat phrase 0 points 2 points 0 points  Total Score 0 points 2 points 0 points    Immunizations Immunization History  Administered Date(s) Administered   DTaP 04/02/2011, 04/02/2011   Influenza, High Dose Seasonal PF 07/05/2015, 07/05/2015   Influenza,inj,Quad PF,6+ Mos 05/16/2016, 07/26/2017   Influenza-Unspecified 06/11/2013, 07/05/2015, 05/16/2016   Pneumococcal Conjugate-13 10/03/2015   Pneumococcal Polysaccharide-23 04/02/2011, 01/15/2017    Screening Tests Health Maintenance  Topic Date Due   DEXA SCAN  01/08/2024 (Originally 03/25/2013)   Lung Cancer Screening  07/21/2024   Fecal DNA (Cologuard)  08/16/2024   Medicare Annual Wellness (AWV)  12/18/2024   Pneumonia Vaccine 31+ Years old  Completed   Hepatitis C Screening  Completed   HPV VACCINES  Aged Out   Meningococcal B Vaccine  Aged Out   DTaP/Tdap/Td  Discontinued   INFLUENZA VACCINE  Discontinued   COVID-19 Vaccine  Discontinued   Zoster Vaccines- Shingrix  Discontinued    Health Maintenance  There are no preventive care reminders to display for this patient.  Health Maintenance Items Addressed: Declines vaccines and bone density  Additional Screening:  Vision  Screening: Recommended annual ophthalmology exams for early detection of glaucoma and other disorders of the eye.  Dental Screening: Recommended annual dental exams for proper oral hygiene  Community Resource Referral / Chronic Care Management: CRR required this visit?  No   CCM required this visit?  No     Plan:     I have personally reviewed and noted the following in the patient's chart:   Medical and social history Use of alcohol, tobacco or illicit drugs  Current medications and supplements including opioid prescriptions. Patient is not currently taking opioid prescriptions. Functional ability and status Nutritional status Physical activity Advanced directives List of other physicians Hospitalizations, surgeries, and ER visits in previous 12 months Vitals Screenings to include cognitive, depression, and falls Referrals and appointments  In addition, I have reviewed and discussed with patient certain preventive protocols, quality metrics, and best practice recommendations. A written personalized care plan for preventive services as well as general preventive health recommendations were provided to patient.     Areatha Beecham, LPN   01/14/159   After Visit Summary: (MyChart) Due to this being a telephonic visit, the after visit summary with patients personalized plan was offered to patient via MyChart  Notes: Nothing significant to report at this time.

## 2024-01-14 ENCOUNTER — Ambulatory Visit (INDEPENDENT_AMBULATORY_CARE_PROVIDER_SITE_OTHER): Payer: Medicare Other | Admitting: Family

## 2024-01-14 ENCOUNTER — Encounter: Payer: Self-pay | Admitting: Family

## 2024-01-14 VITALS — BP 160/92 | HR 52 | Ht 62.0 in | Wt 112.0 lb

## 2024-01-14 DIAGNOSIS — I10 Essential (primary) hypertension: Secondary | ICD-10-CM

## 2024-01-14 DIAGNOSIS — R109 Unspecified abdominal pain: Secondary | ICD-10-CM | POA: Diagnosis not present

## 2024-01-14 DIAGNOSIS — R7309 Other abnormal glucose: Secondary | ICD-10-CM

## 2024-01-14 LAB — COMPREHENSIVE METABOLIC PANEL WITH GFR
ALT: 16 U/L (ref 0–35)
AST: 23 U/L (ref 0–37)
Albumin: 4.9 g/dL (ref 3.5–5.2)
Alkaline Phosphatase: 65 U/L (ref 39–117)
BUN: 13 mg/dL (ref 6–23)
CO2: 30 meq/L (ref 19–32)
Calcium: 9.5 mg/dL (ref 8.4–10.5)
Chloride: 101 meq/L (ref 96–112)
Creatinine, Ser: 0.71 mg/dL (ref 0.40–1.20)
GFR: 82.95 mL/min (ref >60.00)
Glucose, Bld: 98 mg/dL (ref 70–99)
Potassium: 4.3 meq/L (ref 3.5–5.1)
Sodium: 139 meq/L (ref 135–145)
Total Bilirubin: 0.5 mg/dL (ref 0.2–1.2)
Total Protein: 6.9 g/dL (ref 6.0–8.3)

## 2024-01-14 LAB — URINALYSIS, ROUTINE W REFLEX MICROSCOPIC
Bilirubin Urine: NEGATIVE
Hgb urine dipstick: NEGATIVE
Ketones, ur: NEGATIVE
Nitrite: NEGATIVE
Specific Gravity, Urine: <=1.005 — AB (ref 1.000–1.030)
Total Protein, Urine: NEGATIVE
Urine Glucose: NEGATIVE
Urobilinogen, UA: 0.2 (ref 0.0–1.0)
pH: 6.5 (ref 5.0–8.0)

## 2024-01-14 LAB — HEMOGLOBIN A1C: Hgb A1c MFr Bld: 6 % (ref 4.6–6.5)

## 2024-01-14 MED ORDER — AMLODIPINE BESYLATE 10 MG PO TABS: 10.0000 mg | ORAL_TABLET | Freq: Every day | ORAL | 1 refills | Status: DC

## 2024-01-14 NOTE — Progress Notes (Signed)
 [name] is a 78 y.o. 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 Swallow whole.     BRIMONIDINE TARTRATE OP Apply 2 drops to eye daily. R eye     BROMFENAC SODIUM OP Apply 4 drops to eye daily. R eye     Difluprednate 0.05 % EMUL Place 1 drop into the right eye 3 (three) times daily.     fluticasone  (FLONASE ) 50 MCG/ACT nasal spray Place 2 sprays into both nostrils daily. 48 mL 3   rosuvastatin  (CRESTOR ) 20 MG tablet Take 1 tablet (20 mg total) by mouth daily. 90 tablet 2   amLODipine  (NORVASC ) 10 MG tablet Take 1 tablet (10 mg total) by mouth daily. 90 tablet 1   SPIRIVA RESPIMAT 2.5 MCG/ACT AERS SMARTSIG:2 Puff(s) Via Inhaler Daily     No current facility-administered medications for this visit.    Allergies: Avelox [moxifloxacin], Penicillins, Buprenorphine hcl, Morphine and codeine, Nitrofuran derivatives, Other, Procaine, Sulfa antibiotics, Sulfacetamide sodium, and Aspirin   Past Medical History:  Diagnosis Date   Arthritis    High cholesterol    Hypertension     Past Surgical History:  Procedure Laterality Date   ABDOMINAL HYSTERECTOMY     CHOLECYSTECTOMY N/A 01/21/2015   Procedure: LAPAROSCOPIC CHOLECYSTECTOMY;  Surgeon:  Jerryl Morin, MD;  Location: [facility];  Service: General;  Laterality: N/A;   COSMETIC SURGERY     FRACTURE SURGERY     INTRAMEDULLARY (IM) NAIL INTERTROCHANTERIC Left 11/27/2015   Procedure: INTRAMEDULLARY (IM) NAIL INTERTROCHANTRIC;  Surgeon: Adah Acron, MD;  Location: [facility];  Service: Orthopedics;  Laterality: Left;   LAPAROSCOPY N/A 04/15/2015   Procedure: LAPAROSCOPY DIAGNOSTIC ileocalpexy ;  Surgeon: Candyce Champagne, MD;  Location: [facility];  Service: General;  Laterality: N/A;   SMALL INTESTINE SURGERY Right    TONSILLECTOMY     TUBAL LIGATION  08/13/1978    Family History  Problem Relation Age of Onset   Stroke Mother     Social History   Tobacco Use   Smoking status: Former    Current packs/day: 0.00    Average packs/day: 1 pack/day for 40.0 years (40.0 ttl pk-yrs)    Types: Cigarettes, E-cigarettes    Start date: 01/11/1973    Quit date: 01/11/2013    Years since quitting: 11.0   Smokeless tobacco: Never  Substance Use Topics   Alcohol use: Yes    Alcohol/week: 2.0 standard drinks of alcohol    Types: 2 Cans of beer per week    Comment: 1 beer/day    Subjective:   Follow up on hypertension; is currently taking Amlodipine  5 mg daily; Denies any chest pain, shortness of breath, blurred vision or headache; does not check her blood pressure regularly at home- no readings to review today;  Objective:  Vitals:   01/14/24 1023 01/14/24 1051  BP: (!) 150/92 (!) 160/92  Pulse: (!) 52   SpO2: 98%   Weight: 112 lb (50.8 kg)   Height: 5\' 2"  (1.575 m)     General: Well developed, well nourished, in no acute distress  Skin : Warm and dry.  Head: Normocephalic and atraumatic  Eyes: Sclera and conjunctiva clear; pupils round and reactive to light; extraocular movements intact  Ears: External normal; canals clear; tympanic membranes normal  Oropharynx: Pink, supple. No suspicious lesions  Neck: Supple without thyromegaly, adenopathy  Lungs: Respirations unlabored; clear to  auscultation bilaterally without wheeze, rales, rhonchi  CVS exam: normal rate and regular rhythm.  Neurologic: Alert and oriented; speech intact; face symmetrical; moves all extremities well; CNII-XII intact without focal deficit   Assessment:  1. Primary hypertension   2. Flank pain   3. Elevated glucose     Plan:  Uncontrolled; increase Amlodipine  to 10 mg daily; check CBC, CMP; Symptoms sound muscular in nature; will check U/A and urine culture to rule out other source though; Check Hgba1c;   Return in about 1 month (around 02/13/2024) for follow up.  Orders Placed This Encounter  Procedures   Urine Culture   Comp Met (CMET)   Hemoglobin A1c   Urinalysis    Requested Prescriptions   Signed Prescriptions Disp Refills   amLODipine  (NORVASC ) 10 MG tablet 90 tablet 1    Sig: Take 1 tablet (10 mg total) by mouth daily.

## 2024-01-15 LAB — URINE CULTURE
MICRO NUMBER:: 16532289
Result:: NO GROWTH
SPECIMEN QUALITY:: ADEQUATE

## 2024-01-16 ENCOUNTER — Ambulatory Visit: Payer: Self-pay | Admitting: Family

## 2024-02-13 ENCOUNTER — Ambulatory Visit: Admitting: Family

## 2024-02-13 ENCOUNTER — Encounter: Payer: Self-pay | Admitting: Family

## 2024-02-13 VITALS — BP 138/70 | HR 68 | Ht 60.0 in | Wt 111.4 lb

## 2024-02-13 DIAGNOSIS — I10 Essential (primary) hypertension: Secondary | ICD-10-CM | POA: Diagnosis not present

## 2024-02-13 NOTE — Progress Notes (Signed)
 [name] is a 78 y.o. 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 90 tablet 1   aspirin  EC 81 MG tablet Take 81 mg by mouth daily. Swallow whole.     BRIMONIDINE TARTRATE OP Apply 2 drops to eye daily. R eye     BROMFENAC SODIUM OP Apply 4 drops to eye daily. R eye     Difluprednate 0.05 % EMUL Place 1 drop into the right eye 3 (three) times daily.     fluticasone  (FLONASE ) 50 MCG/ACT nasal spray Place 2 sprays into both nostrils daily. 48 mL 3   rosuvastatin  (CRESTOR ) 20 MG tablet Take 1 tablet (20 mg total) by mouth daily. 90 tablet 2   SPIRIVA RESPIMAT 2.5 MCG/ACT AERS SMARTSIG:2 Puff(s) Via Inhaler Daily     No current facility-administered medications for this visit.    Allergies: Avelox [moxifloxacin], Penicillins, Buprenorphine hcl, Morphine and codeine, Nitrofuran derivatives, Other, Procaine, Sulfa antibiotics, Sulfacetamide sodium, and Aspirin   Past Medical History:  Diagnosis Date   Arthritis    High cholesterol    Hypertension     Past Surgical History:  Procedure Laterality Date   ABDOMINAL HYSTERECTOMY     CHOLECYSTECTOMY N/A 01/21/2015   Procedure: LAPAROSCOPIC CHOLECYSTECTOMY;  Surgeon:  Lynwood Pina, MD;  Location: [facility];  Service: General;  Laterality: N/A;   COSMETIC SURGERY     FRACTURE SURGERY     INTRAMEDULLARY (IM) NAIL INTERTROCHANTERIC Left 11/27/2015   Procedure: INTRAMEDULLARY (IM) NAIL INTERTROCHANTRIC;  Surgeon: Oneil JAYSON Herald, MD;  Location: [facility];  Service: Orthopedics;  Laterality: Left;   LAPAROSCOPY N/A 04/15/2015   Procedure: LAPAROSCOPY DIAGNOSTIC ileocalpexy ;  Surgeon: Elspeth Schultze, MD;  Location: [facility];  Service: General;  Laterality: N/A;   SMALL INTESTINE SURGERY Right    TONSILLECTOMY     TUBAL LIGATION  08/13/1978    Family History  Problem Relation Age of Onset   Stroke Mother     Social History   Tobacco Use   Smoking status: Former    Current packs/day: 0.00    Average packs/day: 1 pack/day for 40.0 years (40.0 ttl pk-yrs)    Types: Cigarettes, E-cigarettes    Start date: 01/11/1973    Quit date: 01/11/2013    Years since quitting: 11.0   Smokeless tobacco: Never  Substance Use Topics   Alcohol use: Yes    Alcohol/week: 2.0 standard drinks of alcohol    Types: 2 Cans of beer per week    Comment: 1 beer/day    Subjective:   Follow up on hypertension- known white coat hypertension; Amlodipine  was increased to 10 mg at last OV; home readings have been averaging 120s/70s; Denies any chest pain, shortness of breath, blurred vision or headache;   Objective:  Vitals:   02/13/24 0916 02/13/24 0926  BP: (!) 140/70 138/70  Pulse: 68   SpO2: 97%   Weight: 111 lb 6.4 oz (50.5 kg)   Height: 5' (1.524 m)     General: Well developed, well nourished, in no acute distress  Skin : Warm and dry.  Head: Normocephalic and atraumatic  Eyes: Sclera and conjunctiva clear; pupils round and reactive to light; extraocular movements intact  Ears: External normal; canals clear; tympanic membranes normal  Oropharynx: Pink, supple. No suspicious lesions  Neck: Supple without thyromegaly, adenopathy  Lungs: Respirations unlabored; clear to auscultation  bilaterally without wheeze, rales, rhonchi  CVS exam: normal rate and regular rhythm.  Neurologic: Alert and oriented; speech intact; face symmetrical; moves all extremities well; CNII-XII intact without focal deficit   Assessment:  1. Essential (primary) hypertension     Plan:  Stable; good response to increased dosage of Amlodipine ; continue Amlodipine  10 mg daily; follow up in 6 months;  Patient defers mammogram or DEXA; she is scheduled to see her pulmonologist and cardiologist in the next month also.   Return in about 6 months (around 08/15/2024) for follow up.  No orders of the defined types were placed in this encounter.   Requested Prescriptions    No prescriptions requested or ordered in this encounter

## 2024-03-12 ENCOUNTER — Ambulatory Visit: Payer: Self-pay

## 2024-03-12 ENCOUNTER — Encounter: Payer: Self-pay | Admitting: Family

## 2024-03-12 NOTE — Telephone Encounter (Signed)
 FYI Only or Action Required?: FYI only for provider.  Patient was last seen in primary care on 02/13/2024 by Jason Leita Repine, FNP.  Called Nurse Triage reporting uti.  Symptoms began several days ago.  Interventions attempted: Nothing.  Symptoms are: gradually worsening.  Triage Disposition: See Physician Within 24 Hours  Patient/caregiver understands and will follow disposition?: Yes     Copied from CRM #[id number]. Topic: Clinical - Red Word Triage >> Mar 12, 2024 12:51 PM Shereese L wrote: [facility] that prompted transfer to Nurse Triage: UTI and was advised to come in tomorrow Reason for Disposition  Urinating more frequently than usual (i.e., frequency) OR new-onset of the feeling of an urgent need to urinate (i.e., urgency)  Answer Assessment - Initial Assessment Questions 1. SYMPTOM: What's the main symptom you're concerned about? (e.g., frequency, incontinence)     Burning, frequency 2. ONSET: When did the  burning  start?     Two days ago 3. PAIN: Is there any pain? If Yes, ask: How bad is it? (Scale: 1-10; mild, moderate, severe)     severe 4. CAUSE: What do you think is causing the symptoms?     uti 5. OTHER SYMPTOMS: Do you have any other symptoms? (e.g., blood in urine, fever, flank pain, pain with urination)     Burning, frequency, chills,  6. PREGNANCY: Is there any chance you are pregnant? When was your last menstrual period?     na  Protocols used: Urinary Symptoms-A-AH

## 2024-03-13 ENCOUNTER — Ambulatory Visit (INDEPENDENT_AMBULATORY_CARE_PROVIDER_SITE_OTHER): Admitting: Student

## 2024-03-13 ENCOUNTER — Encounter: Payer: Self-pay | Admitting: Student

## 2024-03-13 VITALS — BP 128/70 | HR 75 | Temp 98.3°F | Resp 12 | Ht 60.0 in | Wt 111.8 lb

## 2024-03-13 DIAGNOSIS — R300 Dysuria: Secondary | ICD-10-CM | POA: Diagnosis not present

## 2024-03-13 DIAGNOSIS — N3091 Cystitis, unspecified with hematuria: Secondary | ICD-10-CM

## 2024-03-13 LAB — POCT URINALYSIS DIP (MANUAL ENTRY)
Bilirubin, UA: NEGATIVE
Glucose, UA: NEGATIVE mg/dL
Ketones, POC UA: NEGATIVE mg/dL
Nitrite, UA: NEGATIVE
Protein Ur, POC: NEGATIVE mg/dL
Spec Grav, UA: <=1.005 — AB (ref 1.010–1.025)
Urobilinogen, UA: 0.2 U/dL
pH, UA: 6 (ref 5.0–8.0)

## 2024-03-13 MED ORDER — CEFPODOXIME PROXETIL 200 MG PO TABS: 200.0000 mg | ORAL_TABLET | Freq: Two times a day (BID) | ORAL | 0 refills | Status: AC

## 2024-03-13 MED ORDER — PHENAZOPYRIDINE HCL 100 MG PO TABS: 100.0000 mg | ORAL_TABLET | Freq: Three times a day (TID) | ORAL | 0 refills | Status: DC | PRN

## 2024-03-13 NOTE — Progress Notes (Signed)
 Chief Complaint  Patient presents with   Dysuria    [name] is a 78 y.o. female here for possible UTI.  C/o burning, urinary frequency, and urinary hesitancy   Duration: 3 days. Symptoms: Dysuria, urinary frequency, urinary hesitancy, and urinary retention Denies hematuria Hx of recurrent UTI? No Denies new sexual partners.  Past Medical History:  Diagnosis Date   Arthritis    High cholesterol    Hypertension      BP 128/70 (BP Location: Right Arm, Patient Position: Sitting, Cuff Size: Normal)   Pulse 75   Temp 98.3 F (36.8 C) (Oral)   Resp 12   Ht 5' (1.524 m)   Wt 111 lb 12.8 oz (50.7 kg)   SpO2 97%   BMI 21.83 kg/m  General: Awake, alert, appears stated age Heart: RRR Lungs: CTAB, normal respiratory effort, no accessory muscle usage Abd: BS+, soft, NT, ND, no masses or organomegaly MSK: No CVA tenderness,  Psych: Age appropriate judgment and insight  Dysuria - Plan: phenazopyridine (PYRIDIUM) 100 MG tablet, POCT urinalysis dipstick, Urine Culture  Cystitis, unspecified with hematuria - Plan: cefpodoxime (VANTIN) 200 MG tablet  UA- +Lueks; + mod blood; urine culture pending Rx- Cefpodoxime and pyridium Patient has multiple medication allergies. I explained that the risk of cross-reactivity between [facility] and cephalosporins is very low (less than 5%). After reviewing the risks and benefits, we discussed proceeding cautiously if a cephalosporin is needed. Patient was educated on signs of an allergic reaction, including rash, swelling, difficulty breathing, and was advised to seek immediate emergency care if symptoms develop. Patient voiced understanding. Stay hydrated. Seek immediate care if pt starts to develop fevers, new/worsening symptoms, uncontrollable N/V. F/u prn. RTC if symptoms worsen or persist. The patient voiced understanding and agreement to the plan.  Harlene LITTIE Jolly, DNP, AGNP-C 03/13/24 3:16 PM

## 2024-03-15 LAB — URINE CULTURE
MICRO NUMBER:: 16776122
SPECIMEN QUALITY:: ADEQUATE

## 2024-03-16 ENCOUNTER — Ambulatory Visit: Payer: Self-pay | Admitting: Student

## 2024-03-26 ENCOUNTER — Other Ambulatory Visit: Payer: Self-pay

## 2024-03-26 DIAGNOSIS — I10 Essential (primary) hypertension: Secondary | ICD-10-CM | POA: Insufficient documentation

## 2024-03-26 DIAGNOSIS — M1990 Unspecified osteoarthritis, unspecified site: Secondary | ICD-10-CM | POA: Insufficient documentation

## 2024-03-26 DIAGNOSIS — E7800 Pure hypercholesterolemia, unspecified: Secondary | ICD-10-CM | POA: Insufficient documentation

## 2024-03-27 ENCOUNTER — Ambulatory Visit: Attending: Cardiology | Admitting: Cardiology

## 2024-05-08 ENCOUNTER — Other Ambulatory Visit: Payer: Self-pay | Admitting: Cardiology

## 2024-05-21 ENCOUNTER — Encounter: Payer: Self-pay | Admitting: Student

## 2024-05-21 DIAGNOSIS — J449 Chronic obstructive pulmonary disease, unspecified: Secondary | ICD-10-CM | POA: Insufficient documentation

## 2024-05-21 NOTE — Progress Notes (Unsigned)
 Subjective:     Patient ID: [name], female    DOB: 02/03/1946, 78 y.o.   MRN: [id number]  No chief complaint on file.   HPI  Discussed the use of AI scribe software for clinical note transcription with the patient, who gave verbal consent to proceed.  Lives with? Married  Follows with [facility]- Dr. [facility]- [facility]  HTN Amlodipine   HLD Crestor  20 mg  COPD- follows with [facility]  Spiriva   Patient denies fever, chills, SOB, CP, palpitations, dyspnea, edema, HA, vision changes, N/V/D, abdominal pain, urinary symptoms, rash, weight changes, and recent illness or hospitalizations.   History of Present Illness              There are no preventive care reminders to display for this patient.  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   Overview:   Bilateral; ultrasound 2016     High cholesterol    Hip fracture (HCC) 11/27/2015   Hypercholesteremia 03/31/2015   Hyperglycemia 03/31/2015   Hypertension    Kidney cysts 03/31/2015   Overview:   Ultrasound 2016     Lateral malleolar fracture 05/13/2020   Lisfranc dislocation, left, initial encounter 05/13/2020   Preop testing    Venous retinal branch occlusion 03/22/2023    Past Surgical History:  Procedure Laterality Date   ABDOMINAL HYSTERECTOMY     CHOLECYSTECTOMY N/A 01/21/2015   Procedure: LAPAROSCOPIC CHOLECYSTECTOMY;  Surgeon: Lynwood Pina, MD;  Location: [facility];  Service: General;  Laterality: N/A;   COSMETIC SURGERY     FRACTURE SURGERY     INTRAMEDULLARY (IM) NAIL INTERTROCHANTERIC Left 11/27/2015   Procedure: INTRAMEDULLARY (IM) NAIL INTERTROCHANTRIC;  Surgeon: Oneil JAYSON Herald, MD;  Location: [facility];  Service:  Orthopedics;  Laterality: Left;   LAPAROSCOPY N/A 04/15/2015   Procedure: LAPAROSCOPY DIAGNOSTIC ileocalpexy ;  Surgeon: Elspeth Schultze, MD;  Location: [facility];  Service: General;  Laterality: N/A;   SMALL INTESTINE SURGERY Right    TONSILLECTOMY     TUBAL LIGATION  08/13/1978    Family History  Problem Relation Age of Onset   Stroke Mother     Social History   Socioeconomic History   Marital status: Married    Spouse name: Koren   Number of children: 3   Years of education: Not on file   Highest education level: 11th grade  Occupational History   Not on file  Tobacco Use   Smoking status: Former    Current packs/day: 0.00    Average packs/day: 1 pack/day for 40.0 years (40.0 ttl pk-yrs)    Types: Cigarettes, E-cigarettes    Start date: 01/11/1973    Quit date: 01/11/2013    Years since quitting: 11.3   Smokeless tobacco: Never  Vaping Use   Vaping status: Never Used  Substance and Sexual Activity   Alcohol use: Yes    Alcohol/week: 2.0 standard drinks of alcohol    Types: 2 Cans of beer per week    Comment: 1 beer/day   Drug use: Never   Sexual activity: Not Currently    Birth control/protection: Surgical  Other Topics Concern   Not on file  Social History Narrative   3 sons   10 grandchildren   3 great grandchildren  Recently got back from [place] seeing granddaughter.   Married x 30 years.   Social Drivers of Corporate investment banker Strain: Low Risk  (03/12/2024)   Overall Financial Resource Strain (CARDIA)    Difficulty of Paying Living Expenses: Not hard at all  Food Insecurity: No Food Insecurity (03/12/2024)   Hunger Vital Sign    Worried About Running Out of Food in the Last Year: Never true    Ran Out of Food in the Last Year: Never true  Transportation Needs: No Transportation Needs (03/12/2024)   PRAPARE - Administrator, Civil Service (Medical): No    Lack of Transportation (Non-Medical): No  Physical Activity: Inactive (03/12/2024)    Exercise Vital Sign    Days of Exercise per Week: 0 days    Minutes of Exercise per Session: Not on file  Stress: No Stress Concern Present (03/12/2024)   [employer] of Occupational Health - Occupational Stress Questionnaire    Feeling of Stress: Not at all  Social Connections: Moderately Isolated (03/12/2024)   Social Connection and Isolation Panel    Frequency of Communication with Friends and Family: Three times a week    Frequency of Social Gatherings with Friends and Family: Once a week    Attends Religious Services: Patient declined    Database administrator or Organizations: No    Attends Engineer, structural: Not on file    Marital Status: Married  Catering manager Violence: Not At Risk (12/19/2023)   Humiliation, Afraid, Rape, and Kick questionnaire    Fear of Current or Ex-Partner: No    Emotionally Abused: No    Physically Abused: No    Sexually Abused: No    Outpatient Medications Prior to Visit  Medication Sig Dispense Refill   amLODipine  (NORVASC ) 10 MG tablet Take 1 tablet (10 mg total) by mouth daily. 90 tablet 1   aspirin  EC 81 MG tablet Take 81 mg by mouth daily. Swallow whole.     BRIMONIDINE TARTRATE OP Apply 2 drops to eye daily. R eye     Difluprednate 0.05 % EMUL Place 1 drop into the right eye 3 (three) times daily.     fluticasone  (FLONASE ) 50 MCG/ACT nasal spray Place 2 sprays into both nostrils daily. 48 mL 3   phenazopyridine  (PYRIDIUM ) 100 MG tablet Take 1 tablet (100 mg total) by mouth 3 (three) times daily as needed for pain. 10 tablet 0   rosuvastatin  (CRESTOR ) 20 MG tablet TAKE 1 TABLET DAILY 90 tablet 3   SPIRIVA RESPIMAT 2.5 MCG/ACT AERS Inhale 2 puffs into the lungs daily.     No facility-administered medications prior to visit.    Allergies  Allergen Reactions   Avelox [Moxifloxacin] Swelling   Penicillins Other (See Comments)    Reported whole-body skin peeling as a child    Buprenorphine Hcl Nausea And Vomiting    vomiting    Morphine And Codeine Other (See Comments)    vomiting   Nitrofuran Derivatives Nausea And Vomiting and Swelling   Other Other (See Comments)   Procaine Other (See Comments)    unknown   Sulfa Antibiotics Other (See Comments)    Unknown   Sulfacetamide Sodium Other (See Comments)    Unknown   Aspirin  Other (See Comments)    vomiting    ROS     Objective:    Physical Exam   There were no vitals taken for this visit. Wt Readings from Last 3 Encounters:  03/13/24  111 lb 12.8 oz (50.7 kg)  02/13/24 111 lb 6.4 oz (50.5 kg)  01/14/24 112 lb (50.8 kg)       Assessment & Plan:   Problem List Items Addressed This Visit     Essential (primary) hypertension - Primary   Hypercholesteremia    I am having Ashleigh H. Berenguer Lou maintain her aspirin  EC, BRIMONIDINE TARTRATE OP, fluticasone , Difluprednate, Spiriva Respimat, amLODipine , phenazopyridine , and rosuvastatin .  No orders of the defined types were placed in this encounter.

## 2024-05-21 NOTE — Assessment & Plan Note (Signed)
 Well controlled, no changes to meds. Encouraged heart healthy diet such as the DASH diet and exercise as tolerated.

## 2024-05-21 NOTE — Assessment & Plan Note (Signed)
 Follows with [facility]. Denies recent exacerbations.

## 2024-05-21 NOTE — Assessment & Plan Note (Signed)
 Tolerating Statin. Encourage heart healthy diet such as MIND or DASH diet, increase exercise, avoid trans fats, simple carbohydrates and processed foods, consider a krill or fish or flaxseed oil cap daily.

## 2024-05-22 ENCOUNTER — Encounter: Payer: Self-pay | Admitting: Student

## 2024-05-22 ENCOUNTER — Ambulatory Visit: Admitting: Student

## 2024-05-22 ENCOUNTER — Ambulatory Visit: Payer: Self-pay | Admitting: Student

## 2024-05-22 VITALS — BP 128/78 | HR 62 | Temp 98.1°F | Resp 12 | Ht 60.0 in | Wt 114.8 lb

## 2024-05-22 DIAGNOSIS — J449 Chronic obstructive pulmonary disease, unspecified: Secondary | ICD-10-CM | POA: Diagnosis not present

## 2024-05-22 DIAGNOSIS — E559 Vitamin D deficiency, unspecified: Secondary | ICD-10-CM

## 2024-05-22 DIAGNOSIS — I10 Essential (primary) hypertension: Secondary | ICD-10-CM | POA: Diagnosis not present

## 2024-05-22 DIAGNOSIS — E7800 Pure hypercholesterolemia, unspecified: Secondary | ICD-10-CM

## 2024-05-22 DIAGNOSIS — R739 Hyperglycemia, unspecified: Secondary | ICD-10-CM | POA: Diagnosis not present

## 2024-05-22 LAB — CBC WITH DIFFERENTIAL/PLATELET
Basophils Absolute: 0 K/uL (ref 0.0–0.1)
Basophils Relative: 0.4 % (ref 0.0–3.0)
Eosinophils Absolute: 0.4 K/uL (ref 0.0–0.7)
Eosinophils Relative: 5.6 % — ABNORMAL HIGH (ref 0.0–5.0)
HCT: 42.1 % (ref 36.0–46.0)
Hemoglobin: 14.1 g/dL (ref 12.0–15.0)
Lymphocytes Relative: 22.8 % (ref 12.0–46.0)
Lymphs Abs: 1.5 K/uL (ref 0.7–4.0)
MCHC: 33.4 g/dL (ref 30.0–36.0)
MCV: 93.1 fl (ref 78.0–100.0)
Monocytes Absolute: 0.5 K/uL (ref 0.1–1.0)
Monocytes Relative: 7.8 % (ref 3.0–12.0)
Neutro Abs: 4.2 K/uL (ref 1.4–7.7)
Neutrophils Relative %: 63.4 % (ref 43.0–77.0)
Platelets: 244 K/uL (ref 150.0–400.0)
RBC: 4.52 Mil/uL (ref 3.87–5.11)
RDW: 13 % (ref 11.5–15.5)
WBC: 6.5 K/uL (ref 4.0–10.5)

## 2024-05-22 LAB — COMPREHENSIVE METABOLIC PANEL WITH GFR
ALT: 15 U/L (ref 0–35)
AST: 22 U/L (ref 0–37)
Albumin: 4.9 g/dL (ref 3.5–5.2)
Alkaline Phosphatase: 64 U/L (ref 39–117)
BUN: 14 mg/dL (ref 6–23)
CO2: 29 meq/L (ref 19–32)
Calcium: 9.7 mg/dL (ref 8.4–10.5)
Chloride: 101 meq/L (ref 96–112)
Creatinine, Ser: 0.66 mg/dL (ref 0.40–1.20)
GFR: 85.37 mL/min (ref >60.00)
Glucose, Bld: 100 mg/dL — ABNORMAL HIGH (ref 70–99)
Potassium: 4.5 meq/L (ref 3.5–5.1)
Sodium: 139 meq/L (ref 135–145)
Total Bilirubin: 0.5 mg/dL (ref 0.2–1.2)
Total Protein: 6.9 g/dL (ref 6.0–8.3)

## 2024-05-22 LAB — VITAMIN D 25 HYDROXY (VIT D DEFICIENCY, FRACTURES): VITD: 36.07 ng/mL (ref 30.00–100.00)

## 2024-05-22 LAB — LIPID PANEL
Cholesterol: 165 mg/dL (ref 0–200)
HDL: 67.6 mg/dL (ref >39.00)
LDL Cholesterol: 83 mg/dL (ref 0–99)
NonHDL: 97.44
Total CHOL/HDL Ratio: 2
Triglycerides: 72 mg/dL (ref 0.0–149.0)
VLDL: 14.4 mg/dL (ref 0.0–40.0)

## 2024-05-22 LAB — HEMOGLOBIN A1C: Hgb A1c MFr Bld: 6 % (ref 4.6–6.5)

## 2024-05-22 LAB — TSH: TSH: 3.32 u[IU]/mL (ref 0.35–5.50)

## 2024-05-22 NOTE — Assessment & Plan Note (Addendum)
 [id number] acceptable, minimize simple carbs. Increase exercise as tolerated.

## 2024-05-22 NOTE — Assessment & Plan Note (Signed)
 Supplement and Monitor

## 2024-07-16 ENCOUNTER — Ambulatory Visit: Attending: Cardiology | Admitting: Cardiology

## 2024-07-16 ENCOUNTER — Encounter: Payer: Self-pay | Admitting: Cardiology

## 2024-07-16 VITALS — BP 150/72 | HR 63 | Ht 62.0 in | Wt 118.0 lb

## 2024-07-16 DIAGNOSIS — J449 Chronic obstructive pulmonary disease, unspecified: Secondary | ICD-10-CM | POA: Diagnosis not present

## 2024-07-16 DIAGNOSIS — I10 Essential (primary) hypertension: Secondary | ICD-10-CM | POA: Insufficient documentation

## 2024-07-16 DIAGNOSIS — I2510 Atherosclerotic heart disease of native coronary artery without angina pectoris: Secondary | ICD-10-CM | POA: Insufficient documentation

## 2024-07-16 NOTE — Progress Notes (Signed)
 Cardiology Office Note:    Date:  07/16/2024   ID:  [name], DOB 1946/05/30, MRN [id number]  PCP:  Wheeler Harlene CROME, NP  Cardiologist:  Lamar Fitch, MD    Referring MD: Jason Leita Repine,*   No chief complaint on file.   History of Present Illness:    [name] is a 78 y.o. female past medical history significant for history of smoking she quit more than 10 years ago, COPD, essential hypertension, dyslipidemia, calcification of the coronary artery, negative stress test, mild to moderate aortic insufficiency.  Comes today to my office for follow-up cardiac wise doing well.  Denies have any chest pain tightness squeezing pressure burning chest, she says she does what she wants to do with no difficulties.  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   Overview:   Bilateral; ultrasound 2016     High cholesterol    Hip fracture (HCC) 11/27/2015   Hypercholesteremia 03/31/2015   Hyperglycemia 03/31/2015   Hypertension    Kidney cysts 03/31/2015   Overview:   Ultrasound 2016     Lateral malleolar fracture 05/13/2020   Lisfranc dislocation, left, initial encounter 05/13/2020   Preop testing    Venous retinal branch occlusion (HCC) 03/22/2023    Past Surgical History:  Procedure Laterality Date   ABDOMINAL HYSTERECTOMY     CHOLECYSTECTOMY N/A 01/21/2015   Procedure: LAPAROSCOPIC CHOLECYSTECTOMY;  Surgeon: Lynwood Pina, MD;  Location: [facility];  Service: General;  Laterality: N/A;   COSMETIC SURGERY     FRACTURE SURGERY     INTRAMEDULLARY (IM) NAIL INTERTROCHANTERIC Left 11/27/2015   Procedure: INTRAMEDULLARY (IM) NAIL INTERTROCHANTRIC;  Surgeon: Oneil JAYSON Herald, MD;  Location: [facility];  Service: Orthopedics;  Laterality: Left;   LAPAROSCOPY N/A 04/15/2015   Procedure: LAPAROSCOPY DIAGNOSTIC ileocalpexy ;  Surgeon: Elspeth Schultze, MD;  Location: [facility];  Service: General;  Laterality: N/A;   SMALL INTESTINE SURGERY Right    TONSILLECTOMY     TUBAL LIGATION  08/13/1978    Current Medications: Current Meds  Medication Sig   amLODipine  (NORVASC ) 10 MG tablet Take 1 tablet (10 mg total) by mouth daily.   aspirin  EC 81 MG tablet Take 81 mg by mouth daily. Swallow whole.   BRIMONIDINE TARTRATE OP Apply 2 drops to eye daily. R eye   Difluprednate 0.05 % EMUL Place 1 drop into the right eye 3 (three) times daily.   fluticasone  (FLONASE ) 50 MCG/ACT nasal spray Place 2 sprays into both nostrils daily.   rosuvastatin  (CRESTOR ) 20 MG tablet TAKE 1 TABLET DAILY   SPIRIVA RESPIMAT 2.5 MCG/ACT AERS Inhale 2 puffs into the lungs daily.   [DISCONTINUED] Bromfenac Sodium 0.09 % SOLN Place 1 drop into the right eye 4 (four) times daily.   [DISCONTINUED] tobramycin (TOBREX) 0.3 % ophthalmic solution Place 1 drop into the right eye 4 (four) times daily.     Allergies:   Avelox [moxifloxacin], Penicillins, Buprenorphine hcl, Morphine and codeine, Nitrofuran derivatives, Other, Procaine, Sulfa antibiotics, Sulfacetamide sodium, and Aspirin    Social History   Socioeconomic History   Marital status: Married    Spouse name: Koren   Number of children: 3   Years of education: Not on file  Highest education level: 11th grade  Occupational History   Not on file  Tobacco Use   Smoking status: Former    Current packs/day: 0.00    Average packs/day: 1 pack/day for 40.0 years (40.0 ttl pk-yrs)    Types: Cigarettes, E-cigarettes    Start date: 01/11/1973    Quit date: 01/11/2013    Years since quitting: 11.5   Smokeless tobacco: Never  Vaping Use   Vaping status: Never Used  Substance and Sexual Activity   Alcohol use: Yes    Alcohol/week: 2.0 standard drinks of  alcohol    Types: 2 Cans of beer per week    Comment: 1 beer/day   Drug use: Never   Sexual activity: Not Currently    Birth control/protection: Surgical  Other Topics Concern   Not on file  Social History Narrative   3 sons   10 grandchildren   3 great grandchildren   Recently got back from [place] seeing granddaughter.   Married x 30 years.   Social Drivers of Corporate Investment Banker Strain: Low Risk  (03/12/2024)   Overall Financial Resource Strain (CARDIA)    Difficulty of Paying Living Expenses: Not hard at all  Food Insecurity: No Food Insecurity (03/12/2024)   Hunger Vital Sign    Worried About Running Out of Food in the Last Year: Never true    Ran Out of Food in the Last Year: Never true  Transportation Needs: No Transportation Needs (03/12/2024)   PRAPARE - Administrator, Civil Service (Medical): No    Lack of Transportation (Non-Medical): No  Physical Activity: Inactive (03/12/2024)   Exercise Vital Sign    Days of Exercise per Week: 0 days    Minutes of Exercise per Session: Not on file  Stress: No Stress Concern Present (03/12/2024)   [employer] of Occupational Health - Occupational Stress Questionnaire    Feeling of Stress: Not at all  Social Connections: Moderately Isolated (03/12/2024)   Social Connection and Isolation Panel    Frequency of Communication with Friends and Family: Three times a week    Frequency of Social Gatherings with Friends and Family: Once a week    Attends Religious Services: Patient declined    Database Administrator or Organizations: No    Attends Engineer, Structural: Not on file    Marital Status: Married     Family History: The patient's family history includes Stroke in her mother. ROS:   Please see the history of present illness.    All 14 point review of systems negative except as described per history of present illness  EKGs/Labs/Other Studies Reviewed:    EKG Interpretation Date/Time:  Thursday  July 16 2024 10:17:49 EST Ventricular Rate:  63 PR Interval:  164 QRS Duration:  82 QT Interval:  420 QTC Calculation: 429 R Axis:   -16  Text Interpretation: Normal sinus rhythm Normal ECG When compared with ECG of 26-Mar-2023 11:35, Premature ventricular complexes are no longer Present Confirmed by Bernie Charleston [phone]) on 07/16/2024 10:18:31 AM    Recent Labs: 05/22/2024: ALT 15; BUN 14; Creatinine, Ser 0.66; Hemoglobin 14.1; Platelets 244.0; Potassium 4.5; Sodium 139; TSH 3.32  Recent Lipid Panel    Component Value Date/Time   CHOL 165 05/22/2024 1037   CHOL 165 04/04/2022 0856   TRIG 72.0 05/22/2024 1037   HDL 67.60 05/22/2024 1037   HDL 81 04/04/2022 0856   CHOLHDL 2 05/22/2024 1037   VLDL 14.4 05/22/2024 1037  LDLCALC 83 05/22/2024 1037   LDLCALC 72 04/04/2022 0856   LDLDIRECT 104 (H) 09/15/2021 1124    Physical Exam:    VS:  BP (!) 150/72   Pulse 63   Ht 5' 2 (1.575 m)   Wt 118 lb (53.5 kg)   SpO2 96%   BMI 21.58 kg/m     Wt Readings from Last 3 Encounters:  07/16/24 118 lb (53.5 kg)  05/22/24 114 lb 12.8 oz (52.1 kg)  03/13/24 111 lb 12.8 oz (50.7 kg)     GEN:  Well nourished, well developed in no acute distress HEENT: Normal NECK: No JVD; No carotid bruits LYMPHATICS: No lymphadenopathy CARDIAC: RRR, no murmurs, no rubs, no gallops RESPIRATORY:  Clear to auscultation without rales, wheezing or rhonchi  ABDOMEN: Soft, non-tender, non-distended MUSCULOSKELETAL:  No edema; No deformity  SKIN: Warm and dry LOWER EXTREMITIES: no swelling NEUROLOGIC:  Alert and oriented x 3 PSYCHIATRIC:  Normal affect   ASSESSMENT:    1. Essential (primary) hypertension   2. Calcification of coronary artery   3. Chronic obstructive pulmonary disease, unspecified COPD type (HCC)    PLAN:    In order of problems listed above:  Coronary disease calcification stress test negative no symptoms that would indicate obstructive disease.  Will continue  antiplatelet therapy risk modifications. Essential hypertension blood pressure well-controlled continue present management. Dyslipidemia she is taking Crestor  20 which I will continue I did review KPN which show me HDL 67 LDL 83 but she admitted that at that time she did not take her medication on the regular basis now she does like I will recheck her fasting lipid profile. COPD.  Noted.   Medication Adjustments/Labs and Tests Ordered: Current medicines are reviewed at length with the patient today.  Concerns regarding medicines are outlined above.  Orders Placed This Encounter  Procedures   EKG 12-Lead   Medication changes: No orders of the defined types were placed in this encounter.   Signed, Lamar DOROTHA Fitch, MD, [facility] 07/16/2024 10:26 AM    [facility]

## 2024-07-16 NOTE — Patient Instructions (Signed)

## 2024-07-20 ENCOUNTER — Other Ambulatory Visit: Payer: Self-pay

## 2024-07-20 MED ORDER — AMLODIPINE BESYLATE 10 MG PO TABS: 10.0000 mg | ORAL_TABLET | Freq: Every day | ORAL | 1 refills | Status: AC

## 2024-08-18 ENCOUNTER — Ambulatory Visit: Admitting: Family

## 2024-11-20 ENCOUNTER — Ambulatory Visit: Admitting: Student

## 2024-12-23 ENCOUNTER — Ambulatory Visit
